# Patient Record
Sex: Male | Born: 1940 | Race: White | Hispanic: No | Marital: Married | Smoking: Never smoker
Health system: Southern US, Community
[De-identification: ages and names within clinical notes are randomized; demographics above are authoritative.]

## PROBLEM LIST (undated history)

## (undated) DIAGNOSIS — I495 Sick sinus syndrome: Secondary | ICD-10-CM

## (undated) DIAGNOSIS — I10 Essential (primary) hypertension: Secondary | ICD-10-CM

## (undated) DIAGNOSIS — I2510 Atherosclerotic heart disease of native coronary artery without angina pectoris: Secondary | ICD-10-CM

## (undated) DIAGNOSIS — I214 Non-ST elevation (NSTEMI) myocardial infarction: Secondary | ICD-10-CM

## (undated) DIAGNOSIS — I480 Paroxysmal atrial fibrillation: Secondary | ICD-10-CM

## (undated) DIAGNOSIS — R0600 Dyspnea, unspecified: Secondary | ICD-10-CM

## (undated) HISTORY — DX: Non-ST elevation (NSTEMI) myocardial infarction: I21.4

## (undated) HISTORY — DX: Atherosclerotic heart disease of native coronary artery without angina pectoris: I25.10

## (undated) HISTORY — DX: Paroxysmal atrial fibrillation: I48.0

## (undated) HISTORY — DX: Essential (primary) hypertension: I10

## (undated) HISTORY — PX: COLONOSCOPY: SHX174

## (undated) HISTORY — DX: Sick sinus syndrome: I49.5

---

## 2017-01-04 DIAGNOSIS — I214 Non-ST elevation (NSTEMI) myocardial infarction: Secondary | ICD-10-CM

## 2017-01-04 HISTORY — DX: Non-ST elevation (NSTEMI) myocardial infarction: I21.4

## 2017-01-31 ENCOUNTER — Inpatient Hospital Stay
Admission: AD | Admit: 2017-01-31 | Payer: Self-pay | Source: Other Acute Inpatient Hospital | Admitting: Internal Medicine

## 2017-01-31 ENCOUNTER — Telehealth: Payer: Self-pay | Admitting: Internal Medicine

## 2017-02-01 ENCOUNTER — Inpatient Hospital Stay (HOSPITAL_COMMUNITY)
Admission: AD | Disposition: A | Discharge status: Home / Self Care | Payer: Self-pay | Source: Other Acute Inpatient Hospital | Attending: Internal Medicine

## 2017-02-01 ENCOUNTER — Encounter (HOSPITAL_COMMUNITY): Payer: Self-pay | Admitting: *Deleted

## 2017-02-01 ENCOUNTER — Inpatient Hospital Stay (HOSPITAL_COMMUNITY)
Admission: AD | Admit: 2017-02-01 | Discharge: 2017-02-02 | DRG: 247 | Disposition: A | Discharge status: Home / Self Care | Payer: Medicare PPO | Source: Other Acute Inpatient Hospital | Attending: Internal Medicine | Admitting: Internal Medicine

## 2017-02-01 ENCOUNTER — Inpatient Hospital Stay (HOSPITAL_COMMUNITY): Payer: Medicare PPO

## 2017-02-01 DIAGNOSIS — I200 Unstable angina: Secondary | ICD-10-CM

## 2017-02-01 DIAGNOSIS — I25110 Atherosclerotic heart disease of native coronary artery with unstable angina pectoris: Secondary | ICD-10-CM | POA: Diagnosis present

## 2017-02-01 DIAGNOSIS — Z8679 Personal history of other diseases of the circulatory system: Secondary | ICD-10-CM

## 2017-02-01 DIAGNOSIS — R079 Chest pain, unspecified: Secondary | ICD-10-CM | POA: Diagnosis present

## 2017-02-01 DIAGNOSIS — Z23 Encounter for immunization: Secondary | ICD-10-CM

## 2017-02-01 DIAGNOSIS — I160 Hypertensive urgency: Secondary | ICD-10-CM

## 2017-02-01 DIAGNOSIS — I10 Essential (primary) hypertension: Secondary | ICD-10-CM

## 2017-02-01 DIAGNOSIS — I214 Non-ST elevation (NSTEMI) myocardial infarction: Secondary | ICD-10-CM | POA: Diagnosis present

## 2017-02-01 DIAGNOSIS — Z880 Allergy status to penicillin: Secondary | ICD-10-CM | POA: Diagnosis not present

## 2017-02-01 DIAGNOSIS — Z955 Presence of coronary angioplasty implant and graft: Secondary | ICD-10-CM

## 2017-02-01 HISTORY — PX: CORONARY STENT INTERVENTION: CATH118234

## 2017-02-01 HISTORY — PX: LEFT HEART CATH AND CORONARY ANGIOGRAPHY: CATH118249

## 2017-02-01 LAB — BASIC METABOLIC PANEL
Anion gap: 8 (ref 5–15)
BUN: 13 mg/dL (ref 6–20)
CALCIUM: 8.5 mg/dL — AB (ref 8.9–10.3)
CO2: 27 mmol/L (ref 22–32)
CREATININE: 1.19 mg/dL (ref 0.61–1.24)
Chloride: 103 mmol/L (ref 101–111)
GFR calc Af Amer: >60 mL/min (ref >60)
GFR calc non Af Amer: 58 mL/min — ABNORMAL LOW (ref >60)
GLUCOSE: 103 mg/dL — AB (ref 65–99)
Potassium: 3.5 mmol/L (ref 3.5–5.1)
SODIUM: 138 mmol/L (ref 135–145)

## 2017-02-01 LAB — CBC
HEMATOCRIT: 38.6 % — AB (ref 39.0–52.0)
Hemoglobin: 13.1 g/dL (ref 13.0–17.0)
MCH: 30.3 pg (ref 26.0–34.0)
MCHC: 33.9 g/dL (ref 30.0–36.0)
MCV: 89.4 fL (ref 78.0–100.0)
PLATELETS: 199 10*3/uL (ref 150–400)
RBC: 4.32 MIL/uL (ref 4.22–5.81)
RDW: 12.8 % (ref 11.5–15.5)
WBC: 10.1 10*3/uL (ref 4.0–10.5)

## 2017-02-01 LAB — LIPID PANEL
CHOL/HDL RATIO: 4.5 ratio
Cholesterol: 193 mg/dL (ref 0–200)
HDL: 43 mg/dL (ref >40)
LDL Cholesterol: 127 mg/dL — ABNORMAL HIGH (ref 0–99)
Triglycerides: 115 mg/dL (ref <150)
VLDL: 23 mg/dL (ref 0–40)

## 2017-02-01 LAB — HEPARIN LEVEL (UNFRACTIONATED): Heparin Unfractionated: 0.51 IU/mL (ref 0.30–0.70)

## 2017-02-01 LAB — PROTIME-INR
INR: 1.07
PROTHROMBIN TIME: 13.8 s (ref 11.4–15.2)

## 2017-02-01 LAB — HEMOGLOBIN A1C
Hgb A1c MFr Bld: 5.3 % (ref 4.8–5.6)
MEAN PLASMA GLUCOSE: 105.41 mg/dL

## 2017-02-01 LAB — TROPONIN I
TROPONIN I: 0.07 ng/mL — AB (ref <0.03)
TROPONIN I: 0.05 ng/mL — AB (ref <0.03)
Troponin I: 0.03 ng/mL (ref <0.03)

## 2017-02-01 LAB — POCT ACTIVATED CLOTTING TIME: ACTIVATED CLOTTING TIME: 439 s

## 2017-02-01 LAB — CREATININE, SERUM
Creatinine, Ser: 1.16 mg/dL (ref 0.61–1.24)
GFR calc Af Amer: >60 mL/min (ref >60)

## 2017-02-01 SURGERY — LEFT HEART CATH AND CORONARY ANGIOGRAPHY
Anesthesia: LOCAL

## 2017-02-01 MED ORDER — MORPHINE SULFATE (PF) 4 MG/ML IV SOLN: 2.0000 mg | INTRAVENOUS | Status: DC | PRN

## 2017-02-01 MED ORDER — SODIUM CHLORIDE 0.9 % IV SOLN
INTRAVENOUS | Status: AC
  Administered 2017-02-01: 1000 mL via INTRAVENOUS

## 2017-02-01 MED ORDER — NITROGLYCERIN 1 MG/10 ML FOR IR/CATH LAB
INTRA_ARTERIAL | Status: DC | PRN
  Administered 2017-02-01: 200 ug via INTRACORONARY

## 2017-02-01 MED ORDER — ATORVASTATIN CALCIUM 80 MG PO TABS
80.0000 mg | ORAL_TABLET | Freq: Every day | ORAL | Status: DC
  Administered 2017-02-01: 19:00:00 80 mg via ORAL
  Filled 2017-02-01: qty 1

## 2017-02-01 MED ORDER — VERAPAMIL HCL 2.5 MG/ML IV SOLN
INTRA_ARTERIAL | Status: DC | PRN
  Administered 2017-02-01 (×2): 5 mL via INTRA_ARTERIAL

## 2017-02-01 MED ORDER — HEPARIN (PORCINE) IN NACL 100-0.45 UNIT/ML-% IJ SOLN
1200.0000 [IU]/h | INTRAMUSCULAR | Status: DC
  Administered 2017-02-01: 1200 [IU]/h via INTRAVENOUS
  Filled 2017-02-01: qty 250

## 2017-02-01 MED ORDER — IOPAMIDOL (ISOVUE-370) INJECTION 76%
INTRAVENOUS | Status: AC
  Filled 2017-02-01: qty 100

## 2017-02-01 MED ORDER — MORPHINE SULFATE (PF) 2 MG/ML IV SOLN: 1.0000 mg | INTRAVENOUS | Status: DC | PRN

## 2017-02-01 MED ORDER — MORPHINE SULFATE (PF) 10 MG/ML IV SOLN: 2.0000 mg | INTRAVENOUS | Status: DC | PRN

## 2017-02-01 MED ORDER — SODIUM CHLORIDE 0.9% FLUSH
3.0000 mL | Freq: Two times a day (BID) | INTRAVENOUS | Status: DC
Start: 2017-02-01 — End: 2017-02-01
  Administered 2017-02-01: 3 mL via INTRAVENOUS

## 2017-02-01 MED ORDER — ACETAMINOPHEN 325 MG PO TABS
650.0000 mg | ORAL_TABLET | ORAL | Status: DC | PRN
  Administered 2017-02-02: 650 mg via ORAL
  Filled 2017-02-01: qty 2

## 2017-02-01 MED ORDER — NITROGLYCERIN 0.4 MG SL SUBL: 0.4000 mg | SUBLINGUAL_TABLET | SUBLINGUAL | Status: DC | PRN

## 2017-02-01 MED ORDER — ATORVASTATIN CALCIUM 40 MG PO TABS: 40.0000 mg | ORAL_TABLET | Freq: Every day | ORAL | Status: DC

## 2017-02-01 MED ORDER — TICAGRELOR 90 MG PO TABS
90.0000 mg | ORAL_TABLET | Freq: Two times a day (BID) | ORAL | Status: DC
  Administered 2017-02-02: 05:00:00 90 mg via ORAL
  Filled 2017-02-01 (×2): qty 1

## 2017-02-01 MED ORDER — ANGIOPLASTY BOOK
Freq: Once | Status: DC
  Filled 2017-02-01: qty 1

## 2017-02-01 MED ORDER — HYDRALAZINE HCL 20 MG/ML IJ SOLN
INTRAMUSCULAR | Status: DC | PRN
  Administered 2017-02-01: 10 mg via INTRAVENOUS

## 2017-02-01 MED ORDER — VERAPAMIL HCL 2.5 MG/ML IV SOLN
INTRAVENOUS | Status: AC
  Filled 2017-02-01: qty 2

## 2017-02-01 MED ORDER — BIVALIRUDIN TRIFLUOROACETATE 250 MG IV SOLR
INTRAVENOUS | Status: AC
  Filled 2017-02-01: qty 250

## 2017-02-01 MED ORDER — SODIUM CHLORIDE 0.9 % IV SOLN: 250.0000 mL | INTRAVENOUS | Status: DC | PRN

## 2017-02-01 MED ORDER — BIVALIRUDIN BOLUS VIA INFUSION - CUPID
INTRAVENOUS | Status: DC | PRN
  Administered 2017-02-01: 65.925 mg via INTRAVENOUS

## 2017-02-01 MED ORDER — SODIUM CHLORIDE 0.9 % IV SOLN
INTRAVENOUS | Status: DC | PRN
  Administered 2017-02-01: 1.75 mg/kg/h via INTRAVENOUS

## 2017-02-01 MED ORDER — TICAGRELOR 90 MG PO TABS
ORAL_TABLET | ORAL | Status: AC
  Filled 2017-02-01: qty 2

## 2017-02-01 MED ORDER — HEPARIN SODIUM (PORCINE) 1000 UNIT/ML IJ SOLN
INTRAMUSCULAR | Status: DC | PRN
  Administered 2017-02-01: 4500 [IU] via INTRAVENOUS

## 2017-02-01 MED ORDER — SODIUM CHLORIDE 0.9 % WEIGHT BASED INFUSION
1.0000 mL/kg/h | INTRAVENOUS | Status: DC
  Administered 2017-02-01: 1 mL/kg/h via INTRAVENOUS

## 2017-02-01 MED ORDER — TICAGRELOR 90 MG PO TABS
ORAL_TABLET | ORAL | Status: DC | PRN
  Administered 2017-02-01: 180 mg via ORAL

## 2017-02-01 MED ORDER — ACETAMINOPHEN 325 MG PO TABS: 650.0000 mg | ORAL_TABLET | ORAL | Status: DC | PRN

## 2017-02-01 MED ORDER — ONDANSETRON HCL 4 MG/2ML IJ SOLN
4.0000 mg | Freq: Four times a day (QID) | INTRAMUSCULAR | Status: DC | PRN
  Administered 2017-02-02: 01:00:00 4 mg via INTRAVENOUS
  Filled 2017-02-01: qty 2

## 2017-02-01 MED ORDER — HYDRALAZINE HCL 20 MG/ML IJ SOLN
10.0000 mg | INTRAMUSCULAR | Status: DC | PRN
  Administered 2017-02-01 – 2017-02-02 (×2): 10 mg via INTRAVENOUS
  Filled 2017-02-01 (×2): qty 1

## 2017-02-01 MED ORDER — HYDRALAZINE HCL 20 MG/ML IJ SOLN
INTRAMUSCULAR | Status: AC
  Filled 2017-02-01: qty 1

## 2017-02-01 MED ORDER — ASPIRIN 81 MG PO CHEW
81.0000 mg | CHEWABLE_TABLET | ORAL | Status: AC
  Administered 2017-02-01: 81 mg via ORAL
  Filled 2017-02-01: qty 1

## 2017-02-01 MED ORDER — SODIUM CHLORIDE 0.9 % WEIGHT BASED INFUSION: 3.0000 mL/kg/h | INTRAVENOUS | Status: DC

## 2017-02-01 MED ORDER — HEPARIN SODIUM (PORCINE) 1000 UNIT/ML IJ SOLN
INTRAMUSCULAR | Status: AC
  Filled 2017-02-01: qty 1

## 2017-02-01 MED ORDER — ASPIRIN 81 MG PO CHEW
81.0000 mg | CHEWABLE_TABLET | Freq: Every day | ORAL | Status: DC
  Administered 2017-02-02: 11:00:00 81 mg via ORAL
  Filled 2017-02-01: qty 1

## 2017-02-01 MED ORDER — ALPRAZOLAM 0.25 MG PO TABS: 0.2500 mg | ORAL_TABLET | Freq: Two times a day (BID) | ORAL | Status: DC | PRN

## 2017-02-01 MED ORDER — ENOXAPARIN SODIUM 100 MG/ML ~~LOC~~ SOLN: 1.0000 mg/kg | Freq: Two times a day (BID) | SUBCUTANEOUS | Status: DC

## 2017-02-01 MED ORDER — LIDOCAINE HCL 2 % IJ SOLN
INTRAMUSCULAR | Status: AC
  Filled 2017-02-01: qty 20

## 2017-02-01 MED ORDER — ONDANSETRON HCL 4 MG/2ML IJ SOLN: 4.0000 mg | Freq: Four times a day (QID) | INTRAMUSCULAR | Status: DC | PRN

## 2017-02-01 MED ORDER — SODIUM CHLORIDE 0.9% FLUSH: 3.0000 mL | INTRAVENOUS | Status: DC | PRN

## 2017-02-01 MED ORDER — SODIUM CHLORIDE 0.9 % IV SOLN
1.7500 mg/kg/h | INTRAVENOUS | Status: AC
  Administered 2017-02-01: 1.75 mg/kg/h via INTRAVENOUS
  Filled 2017-02-01: qty 250

## 2017-02-01 MED ORDER — ASPIRIN EC 81 MG PO TBEC: 81.0000 mg | DELAYED_RELEASE_TABLET | Freq: Every day | ORAL | Status: DC

## 2017-02-01 MED ORDER — LABETALOL HCL 5 MG/ML IV SOLN: 10.0000 mg | INTRAVENOUS | Status: AC | PRN

## 2017-02-01 MED ORDER — HYDRALAZINE HCL 20 MG/ML IJ SOLN: 5.0000 mg | INTRAMUSCULAR | Status: AC | PRN

## 2017-02-01 MED ORDER — SODIUM CHLORIDE 0.9% FLUSH
3.0000 mL | Freq: Two times a day (BID) | INTRAVENOUS | Status: DC
  Administered 2017-02-01: 3 mL via INTRAVENOUS

## 2017-02-01 MED ORDER — INFLUENZA VAC SPLIT HIGH-DOSE 0.5 ML IM SUSY
0.5000 mL | PREFILLED_SYRINGE | INTRAMUSCULAR | Status: AC
  Administered 2017-02-02: 0.5 mL via INTRAMUSCULAR
  Filled 2017-02-01 (×2): qty 0.5

## 2017-02-01 MED ORDER — HEPARIN (PORCINE) IN NACL 2-0.9 UNIT/ML-% IJ SOLN
INTRAMUSCULAR | Status: DC | PRN
  Administered 2017-02-01: 1000 mL

## 2017-02-01 MED ORDER — HEPARIN (PORCINE) IN NACL 2-0.9 UNIT/ML-% IJ SOLN
INTRAMUSCULAR | Status: AC
  Filled 2017-02-01: qty 1000

## 2017-02-01 MED ORDER — NITROGLYCERIN 1 MG/10 ML FOR IR/CATH LAB
INTRA_ARTERIAL | Status: AC
  Filled 2017-02-01: qty 10

## 2017-02-01 SURGICAL SUPPLY — 20 items
BALLN SAPPHIRE 2.0X12 (BALLOONS) ×2
BALLN ~~LOC~~ EMERGE MR 3.25X15 (BALLOONS) ×2
BALLOON SAPPHIRE 2.0X12 (BALLOONS) ×1 IMPLANT
BALLOON ~~LOC~~ EMERGE MR 3.25X15 (BALLOONS) ×1 IMPLANT
CATH INFINITI 5FR ANG PIGTAIL (CATHETERS) ×2 IMPLANT
CATH OPTITORQUE TIG 4.0 5F (CATHETERS) ×2 IMPLANT
CATH VISTA GUIDE 6FR XBLAD3.5 (CATHETERS) ×2 IMPLANT
DEVICE RAD COMP TR BAND LRG (VASCULAR PRODUCTS) ×2 IMPLANT
GLIDESHEATH SLEND A-KIT 6F 22G (SHEATH) ×2 IMPLANT
GUIDEWIRE INQWIRE 1.5J.035X260 (WIRE) ×1 IMPLANT
INQWIRE 1.5J .035X260CM (WIRE) ×2
KIT ENCORE 26 ADVANTAGE (KITS) ×2 IMPLANT
KIT HEART LEFT (KITS) ×2 IMPLANT
PACK CARDIAC CATHETERIZATION (CUSTOM PROCEDURE TRAY) ×2 IMPLANT
STENT SYNERGY DES 3X20 (Permanent Stent) ×2 IMPLANT
SYR MEDRAD MARK V 150ML (SYRINGE) ×2 IMPLANT
TRANSDUCER W/STOPCOCK (MISCELLANEOUS) ×2 IMPLANT
TUBING CIL FLEX 10 FLL-RA (TUBING) ×2 IMPLANT
WIRE ASAHI PROWATER 180CM (WIRE) ×2 IMPLANT
WIRE HI TORQ VERSACORE-J 145CM (WIRE) ×2 IMPLANT

## 2017-02-01 NOTE — Progress Notes (Signed)
Patient admitted after midnight, please see H&P.  For cath later today, NAD Marlin CanaryJessica Hedaya Latendresse DO

## 2017-02-01 NOTE — Progress Notes (Signed)
Progress Note  Patient Name: Calvin GalasMichael Cochran Date of Encounter: 02/01/2017  Primary Cardiologist: New  Subjective   No chest pain. No shortness of breath.   Inpatient Medications    Scheduled Meds: . [START ON 02/02/2017] aspirin EC  81 mg Oral Daily  . atorvastatin  80 mg Oral q1800  . [START ON 02/02/2017] Influenza vac split quadrivalent PF  0.5 mL Intramuscular Tomorrow-1000   Continuous Infusions: . heparin 1,200 Units/hr (02/01/17 0652)   PRN Meds: acetaminophen, ALPRAZolam, hydrALAZINE, morphine injection, nitroGLYCERIN, ondansetron (ZOFRAN) IV   Vital Signs    Vitals:   02/01/17 0043 02/01/17 0616  BP: 131/71 127/68  Pulse: (!) 52 (!) 57  Resp: 16 16  Temp: 98.1 F (36.7 C) 98 F (36.7 C)  TempSrc: Oral Oral  SpO2: 100% 98%  Weight: 196 lb 14.4 oz (89.3 kg) 193 lb 11.2 oz (87.9 kg)  Height: 5\' 9"  (1.753 m)    No intake or output data in the 24 hours ending 02/01/17 0807 Filed Weights   02/01/17 0043 02/01/17 0616  Weight: 196 lb 14.4 oz (89.3 kg) 193 lb 11.2 oz (87.9 kg)    Telemetry    SR - Personally Reviewed  ECG    SR with TWI in anterolateral leads - Personally Reviewed  Physical Exam   General: Well developed, well nourished, male appearing in no acute distress. Head: Normocephalic, atraumatic.  Neck: Supple without bruits, JVD. Lungs:  Resp regular and unlabored, CTA. Heart: RRR, S1, S2, no S3, S4, or murmur; no rub. Abdomen: Soft, non-tender, non-distended with normoactive bowel sounds. No hepatomegaly. No rebound/guarding. No obvious abdominal masses. Extremities: No clubbing, cyanosis, edema. Distal pedal pulses are 2+ bilaterally. Neuro: Alert and oriented X 3. Moves all extremities spontaneously. Psych: Normal affect.  Labs    Chemistry Recent Labs Lab 02/01/17 0125  NA 138  K 3.5  CL 103  CO2 27  GLUCOSE 103*  BUN 13  CREATININE 1.16  1.19  CALCIUM 8.5*  GFRNONAA >60  58*  GFRAA >60  >60  ANIONGAP 8      Hematology Recent Labs Lab 02/01/17 0125  WBC 10.1  RBC 4.32  HGB 13.1  HCT 38.6*  MCV 89.4  MCH 30.3  MCHC 33.9  RDW 12.8  PLT 199    Cardiac Enzymes Recent Labs Lab 02/01/17 0125  TROPONINI 0.07*   No results for input(s): TROPIPOC in the last 168 hours.   BNPNo results for input(s): BNP, PROBNP in the last 168 hours.   DDimer No results for input(s): DDIMER in the last 168 hours.    Radiology    Portable Chest X-ray 1 View  Result Date: 02/01/2017 CLINICAL DATA:  Left-sided chest pain today associated with shortness of breath. History of NSTEMI, hypertension common never smoked. EXAM: PORTABLE CHEST 1 VIEW COMPARISON:  None in PACs FINDINGS: The left lung is well-expanded and clear. On the right there is elevation of the hemidiaphragm. The aerated portion of the right lung is clear. The heart and pulmonary vascularity are normal. The mediastinum is normal in width. The bony thorax is unremarkable. IMPRESSION: Elevated right hemidiaphragm. Otherwise no acute cardiopulmonary abnormality. Electronically Signed   By: David  SwazilandJordan M.D.   On: 02/01/2017 07:24    Cardiac Studies   N/a  Patient Profile     76 y.o. male with no PMH who presented with several days of chest tightness that worsened yesterday morning with associated shortness of breath.   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 Had another episode that night, then again Thursday. Was very fatigued over the weekend. Yesterday morning developed 9/10 chest pain with nausea and shortness of breath. Symptoms very concerning for ACS. Presented to [facility] and transferred here for cath. Trop 0.07. Remains on IV heparin. -- will plan for cardiac cath today. The patient understands that risks included but are not limited to stroke (1 in 1000), death (1 in 1000), kidney failure [usually temporary] (1 in  500), bleeding (1 in 200), allergic reaction [possibly serious] (1 in 200).  -- started on statin, no room for BB as he is bradycardic at rest.     Signed, Laverda Page, NP  02/01/2017, 8:07 AM  Pager # [phone]   Patient seen, examined. Available data reviewed. Agree with findings, assessment, and plan as outlined by Laverda Page, NP-C.  The patient is chest pain-free.  He remains on IV heparin.  On exam he is alert and oriented in no distress, lungs clear, JVP normal, heart is regular rate and rhythm with no murmur or gallop, abdomen is soft and nontender, there is no pretibial edema.  I agree with the documentation above.  The patient has typical symptoms of acute coronary syndrome, a borderline positive troponin, and ST/T wave abnormality concerning for anterior ischemia.  Cardiac catheterization and possible PCI is clearly indicated.  The patient understands the risks and potential benefits which have been clearly discussed and documented in previous notes.  All of his questions were answered today.  Further disposition pending his cardiac catheterization result.  Tonny Bollman, M.D. 02/01/2017 4:51 PM  For questions or updates, please contact [facility] Please consult [email or web address] for contact info under Cardiology/STEMI. Daytime calls, contact the Day Call APP (6a-8a) or assigned team (Teams A-D) provider (7:30a - 5p). All other daytime calls (7:30-5p), contact the Card Master @ [phone].   Nighttime calls, contact the assigned APP (5p-8p) or MD (6:30p-8p). Overnight calls (8p-6a), contact the on call Fellow @ [phone].

## 2017-02-01 NOTE — Interval H&P Note (Signed)
Cath Lab Visit (complete for each Cath Lab visit)  Clinical Evaluation Leading to the Procedure:   ACS: Yes.    Non-ACS:    Anginal Classification: CCS III  Anti-ischemic medical therapy: No Therapy  Non-Invasive Test Results: No non-invasive testing performed  Prior CABG: No previous CABG      History and Physical Interval Note:  02/01/2017 4:09 PM  Calvin Cochran  has presented today for surgery, with the diagnosis of cp  The various methods of treatment have been discussed with the patient and family. After consideration of risks, benefits and other options for treatment, the patient has consented to  Procedure(s): LEFT HEART CATH AND CORONARY ANGIOGRAPHY (N/A) as a surgical intervention .  [name] history has been reviewed, patient examined, no change in status, stable for surgery.  I have reviewed the patient's chart and labs.  Questions were answered to the patient's satisfaction.     Nanetta BattyBerry, Jonathan

## 2017-02-01 NOTE — H&P (Signed)
History and Physical    Demarrio Menges ZOX:[id number] DOB: 1941/01/01 DOA: 02/01/2017  PCP: Patient, No Pcp Per; Confirmed with patient  Patient coming from: Home, by way of [facility]   Chief Complaint: Chest pain   HPI: Yaroslav Gombos is a 76 y.o. male with medical history significant for hypertension, not on any medications, and see department for evaluation of intermittent chest pain for the past 5 days.  Patient reports that beginning 5 days ago, he has been experiencing intermittent left-sided chest pain with radiation towards the right side, described as a pressure sensation, "8/10" in severity, and associated with dyspnea, fatigue, diaphoresis, and nausea.  This has occurred with exertion and without.  After the first episode 5 days ago, the patient used a push mower on his lawn and did not experience any of the symptoms.  Since that time, he has had multiple recurrences which seem to be worsening in severity.  Episode that prompted his ED visit tonight was the most severe.  [facility] ED Course: Upon arrival to the ED, patient is found to be afebrile, saturating adequately on 2 L/min of supplemental oxygen, hypertensive to 170/100, and was stable.  EKG features a sinus rhythm with ST depressions in the inferolateral leads.  Chest x-ray was notable for an elevated right hemidiaphragm and no acute cardiopulmonary disease.  Chemistry panel was unremarkable, so was CBC.  Troponin was undetectable and UDS was negative.  Patient was treated with 324 mg of aspirin, 1 inch of nitroglycerin ointment, and 80 mg of Lovenox.  Resolution of his presenting complaints was reported after the nitroglycerin was applied.  Repeat EKG also demonstrates inferolateral ST depressions, but less pronounced than on the initial study.  Cardiologist here at [facility] was consulted by the ED physician and agreed to see the patient in consultation start troponin-question Ms is here unit stepdown telemetry thanks.   Admission to the hospitalist service was arranged at [facility] and the patient arrives afebrile with slight sinus bradycardia and no apparent distress.  He will undergo further evaluation and management of chest pain concerning for unstable angina.  Review of Systems:  All other systems reviewed and apart from HPI, are negative.  Past Medical History:  Diagnosis Date  . Hypertension     History reviewed. No pertinent surgical history.   reports that he has never smoked. He has never used smokeless tobacco. He reports that he drinks alcohol. He reports that he does not use drugs.  Allergies  Allergen Reactions  . Amoxicillin Itching    Family History  Problem Relation Age of Onset  . Lung cancer Father      Prior to Admission medications   Not on File    Physical Exam: Vitals:   02/01/17 0043  BP: 131/71  Pulse: (!) 52  Resp: 16  Temp: 98.1 F (36.7 C)  TempSrc: Oral  SpO2: 100%  Weight: 89.3 kg (196 lb 14.4 oz)  Height: 5\' 9"  (1.753 m)      Constitutional: NAD, calm, comfortable Eyes: PERTLA, lids and conjunctivae normal ENMT: Mucous membranes are moist. Posterior pharynx clear of any exudate or lesions.   Neck: normal, supple, no masses, no thyromegaly Respiratory: clear to auscultation bilaterally, no wheezing, no crackles. Normal respiratory effort.    Cardiovascular: S1 & S2 heard, regular rate and rhythm. No extremity edema. No significant JVD. Abdomen: No distension, no tenderness, no masses palpated. Bowel sounds normal.  Musculoskeletal: no clubbing / cyanosis. No joint deformity upper and lower  extremities.   Skin: no significant rashes, lesions, ulcers. Warm, dry, well-perfused. Neurologic: CN 2-12 grossly intact. Sensation intact. Strength 5/5 in all 4 limbs.  Psychiatric: Alert and oriented x 3. Pleasant and cooperative.     Labs on Admission: I have personally reviewed following labs and imaging studies  CBC: No results for input(s): WBC,  NEUTROABS, HGB, HCT, MCV, PLT in the last 168 hours. Basic Metabolic Panel: No results for input(s): NA, K, CL, CO2, GLUCOSE, BUN, CREATININE, CALCIUM, MG, PHOS in the last 168 hours. GFR: CrCl cannot be calculated (No order found.). Liver Function Tests: No results for input(s): AST, ALT, ALKPHOS, BILITOT, PROT, ALBUMIN in the last 168 hours. No results for input(s): LIPASE, AMYLASE in the last 168 hours. No results for input(s): AMMONIA in the last 168 hours. Coagulation Profile: No results for input(s): INR, PROTIME in the last 168 hours. Cardiac Enzymes: No results for input(s): CKTOTAL, CKMB, CKMBINDEX, TROPONINI in the last 168 hours. BNP (last 3 results) No results for input(s): PROBNP in the last 8760 hours. HbA1C: No results for input(s): HGBA1C in the last 72 hours. CBG: No results for input(s): GLUCAP in the last 168 hours. Lipid Profile: No results for input(s): CHOL, HDL, LDLCALC, TRIG, CHOLHDL, LDLDIRECT in the last 72 hours. Thyroid Function Tests: No results for input(s): TSH, T4TOTAL, FREET4, T3FREE, THYROIDAB in the last 72 hours. Anemia Panel: No results for input(s): VITAMINB12, FOLATE, FERRITIN, TIBC, IRON, RETICCTPCT in the last 72 hours. Urine analysis: No results found for: COLORURINE, APPEARANCEUR, LABSPEC, PHURINE, GLUCOSEU, HGBUR, BILIRUBINUR, KETONESUR, PROTEINUR, UROBILINOGEN, NITRITE, LEUKOCYTESUR Sepsis Labs: @LABRCNTIP (procalcitonin:4,lacticidven:4) )No results found for this or any previous visit (from the past 240 hour(s)).   Radiological Exams on Admission: No results found.  EKG: Independently reviewed. Sinus rhythm, diffuse repolarization abnormality. Repeat EKG after nitroglycerin (and resolution of pain) with improved ST abnormalities.  Assessment/Plan  1. Chest pain  - Pt presents with intermittent chest pain for past 5 days - In the ED, he had unremarkable CXR, undetectable troponin, and EKG with diffuse ST-depressions  - He was  treated with ASA 324, 1" nitroglycerin ointment, and treatment-dose Lovenox  - Pain resolved with NTG and has not recurred; repeat EKG is improved  - Cardiology is consulting and much appreciated  - Continue cardiac monitoring, obtain serial troponin measurements, start Lipitor, beta-blocker precluded by sinus bradycardia, follow-up cardiology recommendations    2. Hypertension with hypertensive urgency  - BP elevated to 170/100 in ED, improved with NTG  - Pt reports hx of HTN, but does not have PCP or take any medications  - Beta-blocker precluded by sinus bradycardia, will use hydralazine IVP's prn     DVT prophylaxis: Treatment-dose Lovenox  Code Status: Full   Family Communication: Discussed with patient  Disposition Plan: Admit to stepdown  Consults called: Cardiology Admission status: Inpatient    Briscoe Deutscherimothy S Opyd, MD [facility] Pager [phone]  If 7PM-7AM, please contact night-coverage [email or web address] Password [email or web address]  02/01/2017, 1:30 AM

## 2017-02-01 NOTE — Progress Notes (Signed)
ANTICOAGULATION CONSULT NOTE - Initial Consult  Pharmacy Consult for Lovenox>>Heparin Indication: chest pain/ACS  Allergies  Allergen Reactions  . Amoxicillin Itching    Patient Measurements: Height: 5\' 9"  (175.3 cm) Weight: 193 lb 11.2 oz (87.9 kg) IBW/kg (Calculated) : 70.7  Vital Signs: Temp: 98.1 F (36.7 C) (10/29 1300) Temp Source: Oral (10/29 1300) BP: 158/88 (10/29 1300) Pulse Rate: 57 (10/29 0616)  Labs:  Recent Labs  02/01/17 0125 02/01/17 0758 02/01/17 1224 02/01/17 1426  HGB 13.1  --   --   --   HCT 38.6*  --   --   --   PLT 199  --   --   --   LABPROT  --   --  13.8  --   INR  --   --  1.07  --   HEPARINUNFRC  --   --   --  0.51  CREATININE 1.16  1.19  --   --   --   TROPONINI 0.07* 0.05* 0.03*  --     Estimated Creatinine Clearance: 61.3 mL/min (by C-G formula based on SCr of 1.16 mg/dL).   Medical History: Past Medical History:  Diagnosis Date  . Hypertension      Assessment: 76 y/o M transfer from outside hospital changing from Lovenox>>Heparin. CBC stable. Heparin level is therapeutic at 0.51 on rate of 1200 units/hr. No interruptions to heparin infusion. No signs/symptoms of bleeding.   Goal of Therapy:  Heparin level 0.3-0.7 units/ml Monitor platelets by anticoagulation protocol: Yes   Plan:  -Continue heparin at 1200 units/hr -Monitor daily HL, CBC, and s/sx of bleeding -Follow up post-cath for plan with anticoagulation    Girard CooterKimberly Perkins, PharmD Clinical Pharmacist  Pager: [phone] Clinical Phone for 02/01/2017 until 3:30pm: [phone] If after 3:30pm, please call main pharmacy at [phone]

## 2017-02-01 NOTE — H&P (View-Only) (Signed)
CARDIOLOGY CONSULT NOTE   Referring Physician: Dr. Antionette Char Primary Physician: NA Primary Cardiologist: NA Reason for Consultation: NSTEMI   HPI: [name] is a 76 yo man without significant PMH who is seen in consult at the request of Dr. Antionette Char for evaluation and management of NSTEMI. Patient reports that on 10/24 he had an episode of stabbing left chest pain, associated with "heaviness like something is sitting on my chest," nausea, shortness of breath, and diaphoresis. 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 Resolved spontaneously after 15-20 min. Later in the day he was able to mow the lawn without symptoms. He had a repeat episode on 10/25, nonexertional, and again on 10/26. On 10/27 he felt fatigued all day but no specific chest discomfort. He had another episode the AM of 10/28, and then later on 10/28 he described a severe, 9/10 episode of the pain that occurred after a shower. At that time he presented to the [facility] ED for evaluation. On arrival there he was in severe pain. Per report, he was hypertensive to 170/100, initial troponin was negative. He reportedly received 324 mg aspirin, 1 inch of nitropaste, and 80 mg lovenox. His symptoms resolved with nitro. He was transferred to [facility] for further evaluation.  Of note, patient denies any family history of heart disease--mom lived to be 13 and dad died of cancer at 77. Nonsmoker. Does long distance bicycle rides as a hobby. On no medications at home. No diabetes.   Review of Systems:     Cardiac Review of Systems: {Y] = yes [ ]  = no  Chest Pain [Y    ]  Resting SOB Klaus.Mock   ] Exertional SOB  [ N ]  Orthopnea [ N ]   Pedal Edema [  N ]    Palpitations [ N ] Syncope  [  N]   Presyncope [ N  ]  General Review of Systems: [Y] = yes [  ]=no Constitional: recent weight change [  ]; anorexia [  ]; fatigue [  Y]; nausea [  ]; night sweats [  ]; fever [  ]; or chills [  ];                                                                      Eyes : blurred vision [  ]; diplopia [   ]; vision changes [  ];  Amaurosis fugax[  ]; Resp: cough [  ];  wheezing[  ];  hemoptysis[  ];  PND [  ];  GI:  gallstones[  ], vomiting[  ];  dysphagia[  ]; melena[  ];  hematochezia [  ]; heartburn[  ];   GU: kidney stones [  ]; hematuria[  ];   dysuria [  ];  nocturia[  ]; incontinence [  ];             Skin: rash, swelling[  ];, hair loss[  ];  peripheral edema[  ];  or itching[  ]; Musculosketetal: myalgias[  ];  joint swelling[  ];  joint erythema[  ];  joint pain[  ];  back pain[  ];  Heme/Lymph: bruising[  ];  bleeding[  ];  anemia[  ];  Neuro: TIA[  ];  headaches[  ];  stroke[  ];  vertigo[  ];  seizures[  ];   paresthesias[  ];  difficulty walking[  ];  Psych:depression[  ]; anxiety[  ];  Endocrine: diabetes[  ];  thyroid dysfunction[  ];  Other:  Past Medical History:  Diagnosis Date  . Hypertension     No prescriptions prior to admission.     Calvin Cochran. [START ON 02/02/2017] aspirin EC  81 mg Oral Daily  . atorvastatin  80 mg Oral q1800  . [START ON 02/02/2017] Influenza vac split quadrivalent PF  0.5 mL Intramuscular Tomorrow-1000    Infusions: . heparin      Allergies  Allergen Reactions  . Amoxicillin Itching    Social History   Social History  . Marital status: Married    Spouse name: N/A  . Number of children: N/A  . Years of education: N/A   Occupational History  . Not on file.   Social History Main Topics  . Smoking status: Never Smoker  . Smokeless tobacco: Never Used  . Alcohol use Yes     Comment: Occasional  . Drug use: No  . Sexual activity: Not on file   Other Topics Concern  . Not on file   Social History Narrative  . No narrative on file    Family History  Problem Relation Age of Onset  . Lung cancer Father     PHYSICAL EXAM: Vitals:   02/01/17 0043  BP: 131/71  Pulse: (!) 52  Resp: 16  Temp: 98.1 F (36.7 C)  SpO2: 100%    No intake or output data  in the 24 hours ending 02/01/17 [id number]  General:  Well appearing. No respiratory difficulty HEENT: normal Neck: supple. no JVD. Carotids 2+ bilat; no bruits. No lymphadenopathy or thryomegaly appreciated. Cor: PMI nondisplaced. Regular rate & rhythm. No rubs, gallops or murmurs. Lungs: clear Abdomen: soft, nontender, nondistended. No hepatosplenomegaly. No bruits or masses. Good bowel sounds. Extremities: no cyanosis, clubbing, rash, edema Neuro: alert & oriented x 3, cranial nerves grossly intact. moves all 4 extremities w/o difficulty. Affect pleasant.  ECG: sinus bradycardia with precordial T wave inversions, no acute ST elevations  Results for orders placed or performed during the hospital encounter of 02/01/17 (from the past 24 hour(s))  CBC     Status: Abnormal   Collection Time: 02/01/17  1:25 AM  Result Value Ref Range   WBC 10.1 4.0 - 10.5 K/uL   RBC 4.32 4.22 - 5.81 MIL/uL   Hemoglobin 13.1 13.0 - 17.0 g/dL   HCT 40.138.6 (L) 02.739.0 - 25.352.0 %   MCV 89.4 78.0 - 100.0 fL   MCH 30.3 26.0 - 34.0 pg   MCHC 33.9 30.0 - 36.0 g/dL   RDW 66.412.8 40.311.5 - 47.415.5 %   Platelets 199 150 - 400 K/uL  Creatinine, serum     Status: None   Collection Time: 02/01/17  1:25 AM  Result Value Ref Range   Creatinine, Ser 1.16 0.61 - 1.24 mg/dL   GFR calc non Af Amer >60 >60 mL/min   GFR calc Af Amer >60 >60 mL/min  Troponin I     Status: Abnormal   Collection Time: 02/01/17  1:25 AM  Result Value Ref Range   Troponin I 0.07 (HH) <0.03 ng/mL  Basic metabolic panel     Status: Abnormal   Collection Time: 02/01/17  1:25 AM  Result Value Ref Range   Sodium  138 135 - 145 mmol/L   Potassium 3.5 3.5 - 5.1 mmol/L   Chloride 103 101 - 111 mmol/L   CO2 27 22 - 32 mmol/L   Glucose, Bld 103 (H) 65 - 99 mg/dL   BUN 13 6 - 20 mg/dL   Creatinine, Ser 5.40 0.61 - 1.24 mg/dL   Calcium 8.5 (L) 8.9 - 10.3 mg/dL   GFR calc non Af Amer 58 (L) >60 mL/min   GFR calc Af Amer >60 >60 mL/min   Anion gap 8 5 - 15   No  results found.   ASSESSMENT/RECOMMENDATIONS: Shane Crutch is concerning for unstable angina, and first troponin here is now positive, consistent with a non-ST elevation MI. Now chest pain free. Does not have significant known risk factors, nondiabetic per history.  -changed lovenox to heparin -increased atorvastatin to 80 mg nightly -continue aspirin 81 mg. P2Y12 pending possible cath -NPO for possible cath today -echo -BP now improved with cessation of his pain, monitor -no beta blocker for now as he is bradycardic at baseline -ordered A1c, lipid panel  We will see him today to evaluate for cath. Please call with questions.  Jodelle Red, MD, PhD, overnight cardiology provider

## 2017-02-01 NOTE — Progress Notes (Signed)
ANTICOAGULATION CONSULT NOTE - Initial Consult  Pharmacy Consult for Lovenox  Indication: chest pain/ACS  Allergies  Allergen Reactions  . Amoxicillin Itching    Patient Measurements: Height: 5\' 9"  (175.3 cm) Weight: 196 lb 14.4 oz (89.3 kg) IBW/kg (Calculated) : 70.7  Vital Signs: Temp: 98.1 F (36.7 C) (10/29 0043) Temp Source: Oral (10/29 0043) BP: 131/71 (10/29 0043) Pulse Rate: 52 (10/29 0043)  Labs:  Recent Labs  02/01/17 0125  HGB 13.1  HCT 38.6*  PLT 199  CREATININE 1.16  1.19  TROPONINI 0.07*    Estimated Creatinine Clearance: 61.7 mL/min (by C-G formula based on SCr of 1.16 mg/dL).   Medical History: Past Medical History:  Diagnosis Date  . Hypertension    Assessment: 76 y.o. male with chest pain for Lovenox   Plan:  Lovenox 90 mg SQ q12h as ordered by MD  Meshulem Onorato, Gary FleetGregory Vernon 02/01/2017,3:10 AM

## 2017-02-01 NOTE — Consult Note (Signed)
CARDIOLOGY CONSULT NOTE   Referring Physician: Dr. Antionette Char Primary Physician: NA Primary Cardiologist: NA Reason for Consultation: NSTEMI   HPI: [name] is a 76 yo man without significant PMH who is seen in consult at the request of Dr. Antionette Char for evaluation and management of NSTEMI. Patient reports that on 10/24 he had an episode of stabbing left chest pain, associated with "heaviness like something is sitting on my chest," nausea, shortness of breath, and diaphoresis. 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 Resolved spontaneously after 15-20 min. Later in the day he was able to mow the lawn without symptoms. He had a repeat episode on 10/25, nonexertional, and again on 10/26. On 10/27 he felt fatigued all day but no specific chest discomfort. He had another episode the AM of 10/28, and then later on 10/28 he described a severe, 9/10 episode of the pain that occurred after a shower. At that time he presented to the [facility] ED for evaluation. On arrival there he was in severe pain. Per report, he was hypertensive to 170/100, initial troponin was negative. He reportedly received 324 mg aspirin, 1 inch of nitropaste, and 80 mg lovenox. His symptoms resolved with nitro. He was transferred to [facility] for further evaluation.  Of note, patient denies any family history of heart disease--mom lived to be 13 and dad died of cancer at 77. Nonsmoker. Does long distance bicycle rides as a hobby. On no medications at home. No diabetes.   Review of Systems:     Cardiac Review of Systems: {Y] = yes [ ]  = no  Chest Pain [Y    ]  Resting SOB Calvin.Cochran   ] Exertional SOB  [ N ]  Orthopnea [ N ]   Pedal Edema [  N ]    Palpitations [ N ] Syncope  [  N]   Presyncope [ N  ]  General Review of Systems: [Y] = yes [  ]=no Constitional: recent weight change [  ]; anorexia [  ]; fatigue [  Y]; nausea [  ]; night sweats [  ]; fever [  ]; or chills [  ];                                                                      Eyes : blurred vision [  ]; diplopia [   ]; vision changes [  ];  Amaurosis fugax[  ]; Resp: cough [  ];  wheezing[  ];  hemoptysis[  ];  PND [  ];  GI:  gallstones[  ], vomiting[  ];  dysphagia[  ]; melena[  ];  hematochezia [  ]; heartburn[  ];   GU: kidney stones [  ]; hematuria[  ];   dysuria [  ];  nocturia[  ]; incontinence [  ];             Skin: rash, swelling[  ];, hair loss[  ];  peripheral edema[  ];  or itching[  ]; Musculosketetal: myalgias[  ];  joint swelling[  ];  joint erythema[  ];  joint pain[  ];  back pain[  ];  Heme/Lymph: bruising[  ];  bleeding[  ];  anemia[  ];  Neuro: TIA[  ];  headaches[  ];  stroke[  ];  vertigo[  ];  seizures[  ];   paresthesias[  ];  difficulty walking[  ];  Psych:depression[  ]; anxiety[  ];  Endocrine: diabetes[  ];  thyroid dysfunction[  ];  Other:  Past Medical History:  Diagnosis Date  . Hypertension     No prescriptions prior to admission.     Calvin Cochran. [START ON 02/02/2017] aspirin EC  81 mg Oral Daily  . atorvastatin  80 mg Oral q1800  . [START ON 02/02/2017] Influenza vac split quadrivalent PF  0.5 mL Intramuscular Tomorrow-1000    Infusions: . heparin      Allergies  Allergen Reactions  . Amoxicillin Itching    Social History   Social History  . Marital status: Married    Spouse name: N/A  . Number of children: N/A  . Years of education: N/A   Occupational History  . Not on file.   Social History Main Topics  . Smoking status: Never Smoker  . Smokeless tobacco: Never Used  . Alcohol use Yes     Comment: Occasional  . Drug use: No  . Sexual activity: Not on file   Other Topics Concern  . Not on file   Social History Narrative  . No narrative on file    Family History  Problem Relation Age of Onset  . Lung cancer Father     PHYSICAL EXAM: Vitals:   02/01/17 0043  BP: 131/71  Pulse: (!) 52  Resp: 16  Temp: 98.1 F (36.7 C)  SpO2: 100%    No intake or output data  in the 24 hours ending 02/01/17 [id number]  General:  Well appearing. No respiratory difficulty HEENT: normal Neck: supple. no JVD. Carotids 2+ bilat; no bruits. No lymphadenopathy or thryomegaly appreciated. Cor: PMI nondisplaced. Regular rate & rhythm. No rubs, gallops or murmurs. Lungs: clear Abdomen: soft, nontender, nondistended. No hepatosplenomegaly. No bruits or masses. Good bowel sounds. Extremities: no cyanosis, clubbing, rash, edema Neuro: alert & oriented x 3, cranial nerves grossly intact. moves all 4 extremities w/o difficulty. Affect pleasant.  ECG: sinus bradycardia with precordial T wave inversions, no acute ST elevations  Results for orders placed or performed during the hospital encounter of 02/01/17 (from the past 24 hour(s))  CBC     Status: Abnormal   Collection Time: 02/01/17  1:25 AM  Result Value Ref Range   WBC 10.1 4.0 - 10.5 K/uL   RBC 4.32 4.22 - 5.81 MIL/uL   Hemoglobin 13.1 13.0 - 17.0 g/dL   HCT 40.138.6 (L) 02.739.0 - 25.352.0 %   MCV 89.4 78.0 - 100.0 fL   MCH 30.3 26.0 - 34.0 pg   MCHC 33.9 30.0 - 36.0 g/dL   RDW 66.412.8 40.311.5 - 47.415.5 %   Platelets 199 150 - 400 K/uL  Creatinine, serum     Status: None   Collection Time: 02/01/17  1:25 AM  Result Value Ref Range   Creatinine, Ser 1.16 0.61 - 1.24 mg/dL   GFR calc non Af Amer >60 >60 mL/min   GFR calc Af Amer >60 >60 mL/min  Troponin I     Status: Abnormal   Collection Time: 02/01/17  1:25 AM  Result Value Ref Range   Troponin I 0.07 (HH) <0.03 ng/mL  Basic metabolic panel     Status: Abnormal   Collection Time: 02/01/17  1:25 AM  Result Value Ref Range   Sodium  138 135 - 145 mmol/L   Potassium 3.5 3.5 - 5.1 mmol/L   Chloride 103 101 - 111 mmol/L   CO2 27 22 - 32 mmol/L   Glucose, Bld 103 (H) 65 - 99 mg/dL   BUN 13 6 - 20 mg/dL   Creatinine, Ser 5.40 0.61 - 1.24 mg/dL   Calcium 8.5 (L) 8.9 - 10.3 mg/dL   GFR calc non Af Amer 58 (L) >60 mL/min   GFR calc Af Amer >60 >60 mL/min   Anion gap 8 5 - 15   No  results found.   ASSESSMENT/RECOMMENDATIONS: Shane Crutch is concerning for unstable angina, and first troponin here is now positive, consistent with a non-ST elevation MI. Now chest pain free. Does not have significant known risk factors, nondiabetic per history.  -changed lovenox to heparin -increased atorvastatin to 80 mg nightly -continue aspirin 81 mg. P2Y12 pending possible cath -NPO for possible cath today -echo -BP now improved with cessation of his pain, monitor -no beta blocker for now as he is bradycardic at baseline -ordered A1c, lipid panel  We will see him today to evaluate for cath. Please call with questions.  Jodelle Red, MD, PhD, overnight cardiology provider

## 2017-02-01 NOTE — Progress Notes (Signed)
Called in to patients room for c/o SOB and inability to catch his breath. Lungs  CTA LUL diminished. Nasal O2 at 2L for comfort. Coke given for possible Brilinta side affect.

## 2017-02-01 NOTE — Progress Notes (Signed)
ANTICOAGULATION CONSULT NOTE - Initial Consult  Pharmacy Consult for Lovenox>>Heparin Indication: chest pain/ACS  Allergies  Allergen Reactions  . Amoxicillin Itching    Patient Measurements: Height: 5\' 9"  (175.3 cm) Weight: 193 lb 11.2 oz (87.9 kg) IBW/kg (Calculated) : 70.7  Vital Signs: Temp: 98 F (36.7 C) (10/29 0616) Temp Source: Oral (10/29 0616) BP: 127/68 (10/29 0616) Pulse Rate: 57 (10/29 0616)  Labs:  Recent Labs  02/01/17 0125  HGB 13.1  HCT 38.6*  PLT 199  CREATININE 1.16  1.19  TROPONINI 0.07*    Estimated Creatinine Clearance: 61.3 mL/min (by C-G formula based on SCr of 1.16 mg/dL).   Medical History: Past Medical History:  Diagnosis Date  . Hypertension      Assessment: 76 y/o M transfer from outside hospital changing from Lovenox>>Heparin. Last dose Lovenox 11 hours ago.   Goal of Therapy:  Heparin level 0.3-0.7 units/ml Monitor platelets by anticoagulation protocol: Yes   Plan:  -Start heparin at 1200 units/hr at 0730 -1500 HL  Lonette Stevison 02/01/2017,6:26 AM

## 2017-02-02 ENCOUNTER — Encounter (HOSPITAL_COMMUNITY): Payer: Self-pay | Admitting: Cardiovascular Disease

## 2017-02-02 DIAGNOSIS — I200 Unstable angina: Secondary | ICD-10-CM

## 2017-02-02 LAB — BASIC METABOLIC PANEL
Anion gap: 8 (ref 5–15)
BUN: 13 mg/dL (ref 6–20)
CHLORIDE: 108 mmol/L (ref 101–111)
CO2: 23 mmol/L (ref 22–32)
Calcium: 9 mg/dL (ref 8.9–10.3)
Creatinine, Ser: 1.1 mg/dL (ref 0.61–1.24)
GFR calc Af Amer: >60 mL/min (ref >60)
GFR calc non Af Amer: >60 mL/min (ref >60)
GLUCOSE: 101 mg/dL — AB (ref 65–99)
POTASSIUM: 3.7 mmol/L (ref 3.5–5.1)
Sodium: 139 mmol/L (ref 135–145)

## 2017-02-02 LAB — CBC
HCT: 40.9 % (ref 39.0–52.0)
Hemoglobin: 14.1 g/dL (ref 13.0–17.0)
MCH: 30.7 pg (ref 26.0–34.0)
MCHC: 34.5 g/dL (ref 30.0–36.0)
MCV: 89.1 fL (ref 78.0–100.0)
Platelets: 214 10*3/uL (ref 150–400)
RBC: 4.59 MIL/uL (ref 4.22–5.81)
RDW: 12.9 % (ref 11.5–15.5)
WBC: 12 10*3/uL — ABNORMAL HIGH (ref 4.0–10.5)

## 2017-02-02 MED ORDER — TICAGRELOR 90 MG PO TABS: 90.0000 mg | ORAL_TABLET | Freq: Two times a day (BID) | ORAL | 1 refills | Status: DC

## 2017-02-02 MED ORDER — ATORVASTATIN CALCIUM 80 MG PO TABS: 80.0000 mg | ORAL_TABLET | Freq: Every day | ORAL | 0 refills | Status: DC

## 2017-02-02 MED ORDER — NITROGLYCERIN 0.4 MG SL SUBL: 0.4000 mg | SUBLINGUAL_TABLET | SUBLINGUAL | 0 refills | Status: DC | PRN

## 2017-02-02 MED ORDER — METOPROLOL TARTRATE 12.5 MG HALF TABLET
12.5000 mg | ORAL_TABLET | Freq: Two times a day (BID) | ORAL | Status: DC
  Administered 2017-02-02: 11:00:00 12.5 mg via ORAL
  Filled 2017-02-02: qty 1

## 2017-02-02 MED ORDER — TICAGRELOR 90 MG PO TABS: 90.0000 mg | ORAL_TABLET | Freq: Two times a day (BID) | ORAL | 0 refills | Status: DC

## 2017-02-02 MED ORDER — METOPROLOL TARTRATE 25 MG PO TABS: 12.5000 mg | ORAL_TABLET | Freq: Two times a day (BID) | ORAL | 0 refills | Status: DC

## 2017-02-02 MED FILL — Lidocaine HCl Local Inj 2%: INTRAMUSCULAR | Qty: 20 | Status: AC

## 2017-02-02 MED FILL — BRILINTA 90 MG TABLET: 90 | 30 days supply | Qty: 60 | Fill #0

## 2017-02-02 NOTE — Progress Notes (Signed)
Progress Note  Patient Name: Calvin Cochran Date of Encounter: 02/02/2017  Primary Cardiologist: Ellis Parents Sidney Ace)  Subjective   Feeling well this morning.   Inpatient Medications    Scheduled Meds: . angioplasty book   Does not apply Once  . aspirin  81 mg Oral Daily  . atorvastatin  80 mg Oral q1800  . Influenza vac split quadrivalent PF  0.5 mL Intramuscular Tomorrow-1000  . sodium chloride flush  3 mL Intravenous Q12H  . ticagrelor  90 mg Oral BID   Continuous Infusions: . sodium chloride     PRN Meds: sodium chloride, acetaminophen, ALPRAZolam, hydrALAZINE, morphine injection, nitroGLYCERIN, ondansetron (ZOFRAN) IV, sodium chloride flush   Vital Signs    Vitals:   02/02/17 0135 02/02/17 0200 02/02/17 0515 02/02/17 0700  BP:  (!) 127/54 (!) 153/70 (!) 163/81  Pulse:  82 82 88  Resp: 18 19 (!) 21 16  Temp:   97.9 F (36.6 C) 98.4 F (36.9 C)  TempSrc:   Oral Oral  SpO2:  96% 97% 98%  Weight:      Height:        Intake/Output Summary (Last 24 hours) at 02/02/17 0835 Last data filed at 02/02/17 0700  Gross per 24 hour  Intake           421.24 ml  Output             2075 ml  Net         -1653.76 ml   Filed Weights   02/01/17 0043 02/01/17 0616  Weight: 196 lb 14.4 oz (89.3 kg) 193 lb 11.2 oz (87.9 kg)    Telemetry    SR - Personally Reviewed  ECG    SR with TWI in septal leads - Personally Reviewed  Physical Exam   General: Well developed, well nourished, male appearing in no acute distress. Head: Normocephalic, atraumatic.  Neck: Supple without bruits, JVD. Lungs:  Resp regular and unlabored, CTA. Heart: RRR, S1, S2, no S3, S4, or murmur; no rub. Abdomen: Soft, non-tender, non-distended with normoactive bowel sounds. No hepatomegaly. No rebound/guarding. No obvious abdominal masses. Extremities: No clubbing, cyanosis, edema. Distal pedal pulses are 2+ bilaterally. Right radial cath site stable.  Neuro: Alert and oriented X 3. Moves all  extremities spontaneously. Psych: Normal affect.  Labs    Chemistry Recent Labs Lab 02/01/17 0125 02/02/17 0504  NA 138 139  K 3.5 3.7  CL 103 108  CO2 27 23  GLUCOSE 103* 101*  BUN 13 13  CREATININE 1.16  1.19 1.10  CALCIUM 8.5* 9.0  GFRNONAA >60  58* >60  GFRAA >60  >60 >60  ANIONGAP 8 8     Hematology Recent Labs Lab 02/01/17 0125 02/02/17 0504  WBC 10.1 12.0*  RBC 4.32 4.59  HGB 13.1 14.1  HCT 38.6* 40.9  MCV 89.4 89.1  MCH 30.3 30.7  MCHC 33.9 34.5  RDW 12.8 12.9  PLT 199 214    Cardiac Enzymes Recent Labs Lab 02/01/17 0125 02/01/17 0758 02/01/17 1224  TROPONINI 0.07* 0.05* 0.03*   No results for input(s): TROPIPOC in the last 168 hours.   BNPNo results for input(s): BNP, PROBNP in the last 168 hours.   DDimer No results for input(s): DDIMER in the last 168 hours.    Radiology    Portable Chest X-ray 1 View  Result Date: 02/01/2017 CLINICAL DATA:  Left-sided chest pain today associated with shortness of breath. History of NSTEMI, hypertension common never smoked. EXAM: PORTABLE  CHEST 1 VIEW COMPARISON:  None in PACs FINDINGS: The left lung is well-expanded and clear. On the right there is elevation of the hemidiaphragm. The aerated portion of the right lung is clear. The heart and pulmonary vascularity are normal. The mediastinum is normal in width. The bony thorax is unremarkable. IMPRESSION: Elevated right hemidiaphragm. Otherwise no acute cardiopulmonary abnormality. Electronically Signed   By: David  SwazilandJordan M.D.   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  1st Mrg lesion, 30 %stenosed.  2nd Diag-2 lesion, 90 %stenosed.  2nd Diag-1 lesion, 80 %stenosed.  Prox LAD to Mid LAD lesion, 95 %stenosed.  Post intervention, there is a 0% residual stenosis.  A stent was successfully placed.  The left ventricular systolic function is normal.  LV end diastolic pressure is  mildly elevated.  The left ventricular ejection fraction is 55-65% by visual estimate.   IMPRESSION: Successful proximal LAD PCI and drug-eluting stenting using a 3 mm x 20 mm long Synergy drug-eluting stent postdilated to 3.36 mm.  Patient had no other significant CAD.  His second diagonal branch was small and diffusely diseased.  Both the first and second diagonal branch which were crossed by the stent remained patent.  He had normal LV function.  He can be discharged home in the morning on dual antiplatelet therapy.  He should remain on DA PT for at least one year uninterrupted.  Nanetta BattyBerry, Jonathan. MD, [facility] ServicesFACC  Patient Profile     76 y.o. male  With no PMH who [facility]   Assessment & Plan    NSTEMI: Presented with chest pain and underwent cath with 95% mLAD lesion treated with PCI/DES x1. Plan for DAPT with ASA/Brilinta. Did have residual disease in the OM that will be treated medically. Post cath labs were stable. No chest pain. Worked well with cardiac rehab. Also started on high dose statin. Trop peaked only at 0.07. LV gram with normal EF.  -- ASA, Brilinta, statin. Will add low dose metoprolol 12.5mg  BID as HR now in the 70s, and blood pressures elevated. Instructed to follow HR/BP at home after discharge. Will arrange follow up in the [place] office.    Signed, Laverda PageLindsay Roberts, NP  02/02/2017, 8:35 AM  Pager # [phone]   For questions or updates, please contact [facility] Please consult [email or web address] for contact info under Cardiology/STEMI. Daytime calls, contact the Day Call APP (6a-8a) or assigned team (Teams A-D) provider (7:30a - 5p). All other daytime calls (7:30-5p), contact the Card Master @ [phone].   Nighttime calls, contact the assigned APP (5p-8p) or MD (6:30p-8p). Overnight calls (8p-6a), contact the on call Fellow @ [phone].  Patient seen and examined. Agree with assessment  and plan. No recurrent chest pain. Day 1 s/p PCI to LAD. 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 Continue DAPT; high potency statin Rx. OK for DC today with f/u in our  office.    Lennette Biharihomas A. Kelon Easom, MD, [facility]ACC 02/02/2017 8:43 AM

## 2017-02-02 NOTE — Progress Notes (Signed)
CARDIAC REHAB PHASE I   PRE:  Rate/Rhythm: 73 SR  BP:  Supine: 148/90  Sitting:   Standing:    SaO2: 98%RA  MODE:  Ambulation: 800 ft   POST:  Rate/Rhythm: 88 SR  BP:  Supine:   Sitting: 151/86  Standing:    SaO2: 99%RA 0920-1032 Pt walked 800 ft with steady gait and no CP. Tolerated well. MI education completed with pt and wife who voiced understanding. Stressed importance of brilinta with stent. Has seen case Production designer, theatre/television/filmmanager. Reviewed MI restrictions, NTG use, ex ed and heart healthy food choices and CRP2.  [facility]   Luetta Nuttingharlene Dontrez Pettis, RN BSN  02/02/2017 10:29 AM

## 2017-02-02 NOTE — Care Management Note (Signed)
Case Management Note  Patient Details  Name: Calvin Cochran MRN: [id number] Date of Birth: 02/09/1941  Subjective/Objective:   From home with wife, pta indep,s/p  Coronary stent intervention, will be on brilinta, patient states he does not have RX coverage but plan to apply for Medicare during this enrollment time.  NCM gave patient 30 day free savings  brilinta coupon, also gave him 3 sample bottles.  Lillia AbedLindsay PA called his [place] MD office to make sure they have samples there , they state they do.  Also patient will [facility] to get first 30 day free.  He is for dc today.                    Action/Plan: DC home with wife pta indep.   Expected Discharge Date:  02/02/17               Expected Discharge Plan:  Home/Self Care  In-House Referral:     Discharge planning Services  CM Consult  Post Acute Care Choice:    Choice offered to:     DME Arranged:    DME Agency:     HH Arranged:    HH Agency:     Status of Service:  Completed, signed off  If discussed at [employer] Length of Stay Meetings, dates discussed:    Additional Comments:  Leone Havenaylor, Khylan Sawyer Clinton, RN 02/02/2017, 9:37 AM

## 2017-02-02 NOTE — Discharge Summary (Signed)
Physician Discharge Summary  Calvin Cochran WGN:[id number] DOB: [birth year] DOA: 02/01/2017  PCP: Patient, No Pcp Per  Admit date: 02/01/2017 Discharge date: 02/02/2017   Recommendations for Outpatient Follow-Up:   1. Will need samples of brilinta- defer to cards 2. Monitor blood pressure- bring log to PCP 3. ASA/brilinta uninterrupted x 1 year   Discharge Diagnosis:   Principal Problem:   Chest pain Active Problems:   Essential hypertension   Hypertensive urgency   NSTEMI (non-ST elevated myocardial infarction) [facility])   Discharge disposition:  Home  Discharge Condition: Improved.  Diet recommendation: Low sodium, heart healthy  Wound care: None.   History of Present Illness:   Calvin Cochran is a 76 y.o. male with medical history significant for hypertension, not on any medications, and see department for evaluation of intermittent chest pain for the past 5 days.  Patient reports that beginning 5 days ago, he has been experiencing intermittent left-sided chest pain with radiation towards the right side, described as a pressure sensation, "8/10" in severity, and associated with dyspnea, fatigue, diaphoresis, and nausea.  This has occurred with exertion and without.  After the first episode 5 days ago, the patient used a push mower on his lawn and did not experience any of the symptoms.  Since that time, he has had multiple recurrences which seem to be worsening in severity.  Episode that prompted his ED visit tonight was the most severe.   Hospital Course by Problem:   NSTEMI: Presented with chest pain and underwent cath with 95% mLAD lesion treated with PCI/DES x1. Plan for DAPT with ASA/Brilinta x 1 year uninterrupted.  -LV gram with normal EF.  -- ASA, Brilinta, statin -low dose metoprolol 12.5mg  BID as HR now in the 70s, and blood pressures elevated. Instructed to follow HR/BP at home after discharge. Will arrange follow up in the [place] office.    Medical  Consultants:    cards   Discharge Exam:   Vitals:   02/02/17 0515 02/02/17 0700  BP: (!) 153/70 (!) 163/81  Pulse: 82 88  Resp: (!) 21 16  Temp: 97.9 F (36.6 C) 98.4 F (36.9 C)  SpO2: 97% 98%   Vitals:   02/02/17 0135 02/02/17 0200 02/02/17 0515 02/02/17 0700  BP:  (!) 127/54 (!) 153/70 (!) 163/81  Pulse:  82 82 88  Resp: 18 19 (!) 21 16  Temp:   97.9 F (36.6 C) 98.4 F (36.9 C)  TempSrc:   Oral Oral  SpO2:  96% 97% 98%  Weight:      Height:        Gen:  NAD    The results of significant diagnostics from this hospitalization (including imaging, microbiology, ancillary and laboratory) are listed below for reference.     Procedures and Diagnostic Studies:   Portable Chest X-ray 1 View  Result Date: 02/01/2017 CLINICAL DATA:  Left-sided chest pain today associated with shortness of breath. History of NSTEMI, hypertension common never smoked. EXAM: PORTABLE CHEST 1 VIEW COMPARISON:  None in PACs FINDINGS: The left lung is well-expanded and clear. On the right there is elevation of the hemidiaphragm. The aerated portion of the right lung is clear. The heart and pulmonary vascularity are normal. The mediastinum is normal in width. The bony thorax is unremarkable. IMPRESSION: Elevated right hemidiaphragm. Otherwise no acute cardiopulmonary abnormality. Electronically Signed   By: David  [place]az[place]and M.D.   On: 02/01/2017 07:24     Labs:   Basic Metabolic Panel:  Recent Labs Lab  02/01/17 0125 02/02/17 0504  NA 138 139  K 3.5 3.7  CL 103 108  CO2 27 23  GLUCOSE 103* 101*  BUN 13 13  CREATININE 1.16  1.19 1.10  CALCIUM 8.5* 9.0   GFR Estimated Creatinine Clearance: 64.7 mL/min (by C-G formula based on SCr of 1.1 mg/dL). Liver Function Tests: No results for input(s): AST, ALT, ALKPHOS, BILITOT, PROT, ALBUMIN in the last 168 hours. No results for input(s): LIPASE, AMYLASE in the last 168 hours. No results for input(s): AMMONIA in the last 168  hours. Coagulation profile  Recent Labs Lab 02/01/17 1224  INR 1.07    CBC:  Recent Labs Lab 02/01/17 0125 02/02/17 0504  WBC 10.1 12.0*  HGB 13.1 14.1  HCT 38.6* 40.9  MCV 89.4 89.1  PLT 199 214   Cardiac Enzymes:  Recent Labs Lab 02/01/17 0125 02/01/17 0758 02/01/17 1224  TROPONINI 0.07* 0.05* 0.03*   BNP: Invalid input(s): POCBNP CBG: No results for input(s): GLUCAP in the last 168 hours. D-Dimer No results for input(s): DDIMER in the last 72 hours. Hgb A1c  Recent Labs  02/01/17 0758  HGBA1C 5.3   Lipid Profile  Recent Labs  02/01/17 0758  CHOL 193  HDL 43  LDLCALC 127*  TRIG 115  CHOLHDL 4.5   Thyroid function studies No results for input(s): TSH, T4TOTAL, T3FREE, THYROIDAB in the last 72 hours.  Invalid input(s): FREET3 Anemia work up No results for input(s): VITAMINB12, FOLATE, FERRITIN, TIBC, IRON, RETICCTPCT in the last 72 hours. Microbiology No results found for this or any previous visit (from the past 240 hour(s)).   Discharge Instructions:   Discharge Instructions    AMB Referral to Cardiac Rehabilitation - Phase II    Complete by:  As directed    Diagnosis:  NSTEMI   Diet - low sodium heart healthy    Complete by:  As directed    Increase activity slowly    Complete by:  As directed      Allergies as of 02/02/2017      Reactions   Amoxicillin Itching      Medication List    STOP taking these medications   esomeprazole 20 MG capsule Commonly known as:  NEXIUM     TAKE these medications   aspirin EC 81 MG tablet Take 81 mg by mouth daily as needed for mild pain.   atorvastatin 80 MG tablet Commonly known as:  LIPITOR Take 1 tablet (80 mg total) by mouth daily at 6 PM.   metoprolol tartrate 25 MG tablet Commonly known as:  LOPRESSOR Take 0.5 tablets (12.5 mg total) by mouth 2 (two) times daily.   nitroGLYCERIN 0.4 MG SL tablet Commonly known as:  NITROSTAT Place 1 tablet (0.4 mg total) under the tongue  every 5 (five) minutes x 3 doses as needed for chest pain.   ticagrelor 90 MG Tabs tablet Commonly known as:  BRILINTA Take 1 tablet (90 mg total) by mouth 2 (two) times daily.      Follow-up Information    Jodelle GrossLawrence, Kathryn M, NP Follow up on 02/09/2017.   Specialties:  Nurse Practitioner, Radiology, Cardiology Why:  at 3pm for your follow up appt.  Contact information: [address] [place] [id number] [phone]            Time coordinating discharge: 35 min  Signed:  Haidyn Kilburg U Melanny Wire   [facility] 02/02/2017, 9:29 AM

## 2017-02-08 NOTE — Progress Notes (Signed)
Cardiology Office Note   Date:  02/09/2017   ID:  Calvin Cochran, DOB 07/11/1940, MRN [id number]  PCP:  Patient, No Pcp Per  Cardiologist[facility] Chief Complaint  Patient presents with  . Coronary Artery Disease  . Hospitalization Follow-up      History of Present Illness: Calvin Cochran is a 76 y.o. male who presents for posthospitalization follow-up after admission for non-ST elevation MI, with hypertensive urgency.  The patient subsequently had cardiac catheterization which revealed a 95% mid LAD lesion which was treated with a PCI/drug-eluting stent x1.  The patient was started on aspirin and Brilinta which he will continue for 1 year uninterrupted.  LV gram revealed normal LV systolic function.  He was also started on low-dose metoprolol 12.5 mg twice daily, statin therapy.   Conclusion Cardiac Cath 02/01/2017    RPDA lesion, 50 %stenosed.  1st Mrg lesion, 30 %stenosed.  2nd Diag-2 lesion, 90 %stenosed.  2nd Diag-1 lesion, 80 %stenosed.  Prox LAD to Mid LAD lesion, 95 %stenosed.  Post intervention, there is a 0% residual stenosis.  A stent was successfully placed.  The left ventricular systolic function is normal.  LV end diastolic pressure is mildly elevated.  The left ventricular ejection fraction is 55-65% by visual estimate.    He is a very pleasant man who comes today with his wife feeling very well post hospitalization after catheterization.  He did have some sluggishness when beginning metoprolol, but that has subsided.  He is tolerating Brilinta atorvastatin and aspirin without any bleeding or myalgias.  Note, his anginal equivalent was sharp pain in his axillary area.  He thought it may be musculoskeletal but was happy that he was able to check it out and receive treatment.   He is a former triathlon, and cyclist.  Not been exercising over the last 2 years however.  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  He  wishes to be established in our [place] office which is closer to his home.  Past Medical History:  Diagnosis Date  . Hypertension     History reviewed. No pertinent surgical history.   Current Outpatient Medications  Medication Sig Dispense Refill  . aspirin EC 81 MG tablet Take 81 mg by mouth daily as needed for mild pain.    [name]. atorvastatin (LIPITOR) 80 MG tablet Take 1 tablet (80 mg total) by mouth daily at 6 PM. 30 tablet 0  . metoprolol tartrate (LOPRESSOR) 25 MG tablet Take 0.5 tablets (12.5 mg total) by mouth 2 (two) times daily. 60 tablet 0  . nitroGLYCERIN (NITROSTAT) 0.4 MG SL tablet Place 1 tablet (0.4 mg total) under the tongue every 5 (five) minutes x 3 doses as needed for chest pain. 10 tablet 0  . ticagrelor (BRILINTA) 90 MG TABS tablet Take 1 tablet (90 mg total) by mouth 2 (two) times daily. 60 tablet 0   No current facility-administered medications for this visit.     Allergies:   Amoxicillin    Social History:  The patient  reports that  has never smoked. he has never used smokeless tobacco. He reports that he drinks alcohol. He reports that he does not use drugs.   Family History:  [name] family history includes Lung cancer in his father.    ROS: All other systems are reviewed and negative. Unless otherwise mentioned in H&P    PHYSICAL EXAM: VS:  Ht 5\' 9"  (1.753 m)   Wt 195 lb (88.5 kg)  BMI 28.80 kg/m  , BMI Body mass index is 28.8 kg/m. GEN: Well nourished, well developed, in no acute distress  HEENT: normal  Neck: no JVD, carotid bruits, or masses Cardiac: RRR; no murmurs, rubs, or gallops,no edema  Respiratory:  clear to auscultation bilaterally, normal work of breathing GI: soft, nontender, nondistended, + BS MS: no deformity or atrophy right wrist catheter insertion site is well-healed with mild ecchymosis noted.  No bleeding Skin: warm and dry, no rash Neuro:  Strength and sensation are intact Psych: euthymic mood, full affect   Recent  Labs: 02/02/2017: BUN 13; Creatinine, Ser 1.10; Hemoglobin 14.1; Platelets 214; Potassium 3.7; Sodium 139    Lipid Panel    Component Value Date/Time   CHOL 193 02/01/2017 0758   TRIG 115 02/01/2017 0758   HDL 43 02/01/2017 0758   CHOLHDL 4.5 02/01/2017 0758   VLDL 23 02/01/2017 0758   LDLCALC 127 (H) 02/01/2017 0758      Wt Readings from Last 3 Encounters:  02/09/17 195 lb (88.5 kg)  02/01/17 193 lb 11.2 oz (87.9 kg)     ASSESSMENT AND PLAN:  1.  Coronary artery disease: 95% proximal LAD stenosis status post PCI.  He had residual stenosis in the second diagonal and first diagonal and 90% 80% respectively.  Intervention was necessary at that time.  He remains on dual antiplatelet therapy with Brilinta and aspirin.  He offers no side effects from this medication.  He continues on metoprolol 12.5 mg twice daily.  Initially had some sluggishness which has subsided.  He is interested in cardiac rehab.  He used to be very athletic up until 2 years ago.  He wishes to be established in our [facility] office, as this is closer to his home in [place].  He will be assigned to Dr. Nona Dell.  He was see him for the first time in 3 months.  I have given him copies of his cardiac catheterization illustration for his own records.  2.  Hypercholesterolemia: He has been placed on atorvastatin 80 mg daily.  He does ask about co-Q10.  Have advised that this is okay for him to take should he have myalgia pain.  Currently he is asymptomatic.  Follow-up labs in 3 months.   Current medicines are reviewed at length with the patient today.    Labs/ tests ordered today include:  Calvin Cochran. Calvin Cochran, ANP, AACC   02/09/2017 2:58 PM    [facility] [address], [place], [place] [address] Phone: [phone]; Fax: [phone]

## 2017-02-09 ENCOUNTER — Encounter: Payer: Self-pay | Admitting: Adult Health

## 2017-02-09 ENCOUNTER — Ambulatory Visit: Payer: Medicare PPO | Admitting: Adult Health

## 2017-02-09 VITALS — BP 126/84 | HR 74 | Ht 69.0 in | Wt 195.0 lb

## 2017-02-09 DIAGNOSIS — I2510 Atherosclerotic heart disease of native coronary artery without angina pectoris: Secondary | ICD-10-CM

## 2017-02-09 DIAGNOSIS — E7800 Pure hypercholesterolemia, unspecified: Secondary | ICD-10-CM | POA: Diagnosis not present

## 2017-02-09 MED ORDER — ATORVASTATIN CALCIUM 80 MG PO TABS
80.0000 mg | ORAL_TABLET | Freq: Every day | ORAL | 1 refills | Status: DC
Start: 2017-02-09 — End: 2017-05-19

## 2017-02-09 MED ORDER — METOPROLOL TARTRATE 25 MG PO TABS: 12.5000 mg | ORAL_TABLET | Freq: Two times a day (BID) | ORAL | 1 refills | Status: DC

## 2017-02-09 NOTE — Patient Instructions (Addendum)
Your physician recommends that you schedule a follow-up appointment in: [facility] with [facility].McDowell    I refilled your Atorvastatin and Metoprolol   Samples Brilinta 90 mg, two bottles- 8 tabs each, lot # [id number], exp 07/2019  No lab work or tests ordered today.   Your physician recommends that you continue on your current medications as directed. Please refer to the Current Medication list given to you today.       Thank you for choosing [facility] !

## 2017-02-10 ENCOUNTER — Telehealth: Payer: Self-pay | Admitting: Adult Health

## 2017-02-10 ENCOUNTER — Encounter: Payer: Self-pay | Admitting: *Deleted

## 2017-02-10 NOTE — Telephone Encounter (Signed)
Pt states that supervisor told him that he will need a to return to work.

## 2017-02-10 NOTE — Telephone Encounter (Signed)
Per pt phone call--he's needing a return to work note to be faxed to his employer @  [phone]

## 2017-02-11 ENCOUNTER — Encounter: Payer: Self-pay | Admitting: *Deleted

## 2017-02-11 NOTE — Telephone Encounter (Signed)
Please give him a "May return to work on Monday, February 15, 2017 without restrictions." letter for his work.

## 2017-02-11 NOTE — Telephone Encounter (Signed)
Letter complete and faxed.

## 2017-04-09 ENCOUNTER — Other Ambulatory Visit: Payer: Self-pay

## 2017-04-09 ENCOUNTER — Other Ambulatory Visit: Payer: Self-pay | Admitting: Cardiology

## 2017-04-09 MED ORDER — TICAGRELOR 90 MG PO TABS: 90.0000 mg | ORAL_TABLET | Freq: Two times a day (BID) | ORAL | 1 refills | Status: DC

## 2017-04-09 MED ORDER — TICAGRELOR 90 MG PO TABS: 90.0000 mg | ORAL_TABLET | Freq: Two times a day (BID) | ORAL | 3 refills | Status: DC

## 2017-04-09 NOTE — Telephone Encounter (Signed)
Done

## 2017-04-09 NOTE — Telephone Encounter (Signed)
° ° °  1. Which medications need to be refilled? (please list name of each medication and dose if known)ticagrelor (BRILINTA) 90 MG TABS    2. Which pharmacy/location (including street and city if local pharmacy) is medication to be sent to   [facility]  [place] [place]   3. Do they need a 30 day or 90 day supply?

## 2017-05-17 ENCOUNTER — Encounter: Payer: Self-pay | Admitting: Cardiology

## 2017-05-17 NOTE — Progress Notes (Signed)
Cardiology Office Note  Date: 05/19/2017   ID: Calvin GalasMichael Vandyken, DOB 07/19/1940, MRN [id number]  PCP: Patient, No Pcp Per  Evaluating cardiologist: Nona DellSamuel McDowell, MD   Chief Complaint  Patient presents with  . Coronary Artery Disease    History of Present Illness: Calvin Cochran is a 77 y.o. male that I am meeting in clinic for the first time today.  He was most recently seen by Ms. Liborio NixonLawrence DNP in November 2018.  I reviewed records and updated the chart.  He states that he has been doing well overall, no angina symptoms.  He is exercising 3 days a week at a gym in [place].  He was previously an avid cyclist, also did triathlons at a younger age.  Cardiac history includes NSTEMI in October 2018 managed with DES to the mid LAD, otherwise with mild to moderate residual disease that was managed medically.  LVEF was normal by left ventriculography.  He continues on aspirin and Brilinta.  Has had some mild shortness of breath on Brilinta but not limiting.  We went over his lipid panel from October 2018 and discussed obtaining follow-up labs.  He has tolerated high-dose Lipitor.  We also discussed general pathophysiology of atherosclerotic coronary disease.  Past Medical History:  Diagnosis Date  . CAD (coronary artery disease)    DES to mid LAD October 2018  . Essential hypertension   . NSTEMI (non-ST elevated myocardial infarction) (HCC) 01/2017    Past Surgical History:  Procedure Laterality Date  . CORONARY STENT INTERVENTION N/A 02/01/2017   Procedure: CORONARY STENT INTERVENTION;  Surgeon: Runell GessBerry, Jonathan J, MD;  [facility];  Service: Cardiovascular;  Laterality: N/A;  LAD Prox-Mid lesion stented  . LEFT HEART CATH AND CORONARY ANGIOGRAPHY N/A 02/01/2017   Procedure: LEFT HEART CATH AND CORONARY ANGIOGRAPHY;  Surgeon: Runell GessBerry, Jonathan J, MD;  Location: [facility];  Service: Cardiovascular;  Laterality: N/A;    Current Outpatient Medications    Medication Sig Dispense Refill  . aspirin EC 81 MG tablet Take 81 mg by mouth daily as needed for mild pain.    [name]. atorvastatin (LIPITOR) 80 MG tablet Take 1 tablet (80 mg total) by mouth daily at 6 PM. 90 tablet 1  . metoprolol tartrate (LOPRESSOR) 25 MG tablet Take 0.5 tablets (12.5 mg total) 2 (two) times daily by mouth. 90 tablet 1  . nitroGLYCERIN (NITROSTAT) 0.4 MG SL tablet Place 1 tablet (0.4 mg total) under the tongue every 5 (five) minutes x 3 doses as needed for chest pain. 10 tablet 0  . ticagrelor (BRILINTA) 90 MG TABS tablet Take 1 tablet (90 mg total) by mouth 2 (two) times daily. 180 tablet 1   No current facility-administered medications for this visit.    Allergies:  Amoxicillin   Social History: The patient  reports that  has never smoked. he has never used smokeless tobacco. He reports that he drinks alcohol. He reports that he does not use drugs.   ROS:  Please see the history of present illness. Otherwise, complete review of systems is positive for none.  All other systems are reviewed and negative.   Physical Exam: VS:  BP 118/84   Pulse (!) 55   Ht 5\' 9"  (1.753 m)   Wt 199 lb (90.3 kg)   SpO2 98%   BMI 29.39 kg/m , BMI Body mass index is 29.39 kg/m.  Wt Readings from Last 3 Encounters:  05/19/17 199 lb (90.3 kg)  02/09/17 195  lb (88.5 kg)  02/01/17 193 lb 11.2 oz (87.9 kg)    General: Patient appears comfortable at rest. HEENT: Conjunctiva and lids normal, oropharynx clear. Neck: Supple, no elevated JVP or carotid bruits, no thyromegaly. Lungs: Clear to auscultation, nonlabored breathing at rest. Cardiac: Regular rate and rhythm, no S3 or significant systolic murmur, no pericardial rub. Abdomen: Soft, nontender, bowel sounds present. Extremities: No pitting edema, distal pulses 2+. Skin: Warm and dry. Musculoskeletal: No kyphosis. Neuropsychiatric: Alert and oriented x3, affect grossly appropriate.  ECG: I personally reviewed the tracing from  02/02/2017 which showed sinus rhythm with diffuse nonspecific ST-T wave changes, likely anterior ischemia.  Recent Labwork: 02/02/2017: BUN 13; Creatinine, Ser 1.10; Hemoglobin 14.1; Platelets 214; Potassium 3.7; Sodium 139     Component Value Date/Time   CHOL 193 02/01/2017 0758   TRIG 115 02/01/2017 0758   HDL 43 02/01/2017 0758   CHOLHDL 4.5 02/01/2017 0758   VLDL 23 02/01/2017 0758   LDLCALC 127 (H) 02/01/2017 0758    Other Studies Reviewed Today:  Cardiac catheterization and PCI 02/01/2017:  RPDA lesion, 50 %stenosed.  1st Mrg lesion, 30 %stenosed.  2nd Diag-2 lesion, 90 %stenosed.  2nd Diag-1 lesion, 80 %stenosed.  Prox LAD to Mid LAD lesion, 95 %stenosed.  Post intervention, there is a 0% residual stenosis.  A stent was successfully placed.  The left ventricular systolic function is normal.  LV end diastolic pressure is mildly elevated.  The left ventricular ejection fraction is 55-65% by visual estimate.  Assessment and Plan:  1.  CAD with history of DES to the mid LAD in October 2018 in the setting of NSTEMI.  He is doing well clinically, exercising regularly, reports compliance with his medications.  He will continue on dual antiplatelet therapy for now.  2.  Mild hyperlipidemia, LDL 127 in October 2018.  He has continued on Lipitor 80 mg daily and we will obtain follow-up FLP and LFTs.  3.  Health maintenance.  I recommended that he establish with a PCP.  Current medicines were reviewed with the patient today.   Orders Placed This Encounter  Procedures  . Hepatic function panel  . Lipid Profile    Disposition: Follow-up in 6 months.  Signed, Jonelle Sidle, MD, [facility] 05/19/2017 2:40 PM    [facility] at [facility] [address] [place], [place], [place] [address] Phone: [phone]; Fax: [phone]

## 2017-05-19 ENCOUNTER — Ambulatory Visit: Payer: Medicare PPO | Admitting: Cardiology

## 2017-05-19 ENCOUNTER — Encounter: Payer: Self-pay | Admitting: Cardiology

## 2017-05-19 VITALS — BP 118/84 | HR 55 | Ht 69.0 in | Wt 199.0 lb

## 2017-05-19 DIAGNOSIS — I25119 Atherosclerotic heart disease of native coronary artery with unspecified angina pectoris: Secondary | ICD-10-CM | POA: Diagnosis not present

## 2017-05-19 DIAGNOSIS — E7800 Pure hypercholesterolemia, unspecified: Secondary | ICD-10-CM

## 2017-05-19 MED ORDER — ATORVASTATIN CALCIUM 80 MG PO TABS: 80.0000 mg | ORAL_TABLET | Freq: Every day | ORAL | 1 refills | Status: DC

## 2017-05-19 NOTE — Patient Instructions (Signed)
Medication Instructions:  Your physician recommends that you continue on your current medications as directed. Please refer to the Current Medication list given to you today.  Labwork:  LIVER,LIPIDS  You must be fasting  Orders given today  Testing/Procedures: NONE  Follow-Up: Your physician wants you to follow-up in: 6 MONTHS WITH DR. Diona BrownerMCDOWELL You will receive a reminder letter in the mail two months in advance. If you don't receive a letter, please call our office to schedule the follow-up appointment.  Any Other Special Instructions Will Be Listed Below (If Applicable).  If you need a refill on your cardiac medications before your next appointment, please call your pharmacy.

## 2017-06-01 ENCOUNTER — Telehealth: Payer: Self-pay

## 2017-06-01 NOTE — Telephone Encounter (Signed)
Patient notified. No PCP  

## 2017-06-01 NOTE — Telephone Encounter (Signed)
-----   Message from Eustace MooreLydia M Anderson, LPN sent at 1/61/09602/26/2019 10:25 AM EST -----   ----- Message ----- From: Jonelle SidleMcDowell, Samuel G, MD Sent: 06/01/2017  10:24 AM To: No Pcp Per Patient, Eustace MooreLydia M Anderson, LPN  Results reviewed.  AST and ALT are normal.  Cholesterol has come down nicely with LDL 72 at this point.  Continue current regimen. A copy of this test should be forwarded to Patient, No Pcp Per.

## 2017-10-05 ENCOUNTER — Other Ambulatory Visit: Payer: Self-pay | Admitting: *Deleted

## 2017-10-05 MED ORDER — METOPROLOL TARTRATE 25 MG PO TABS: 12.5000 mg | ORAL_TABLET | Freq: Two times a day (BID) | ORAL | 1 refills | Status: DC

## 2017-10-25 ENCOUNTER — Other Ambulatory Visit: Payer: Self-pay | Admitting: *Deleted

## 2017-10-25 MED ORDER — TICAGRELOR 90 MG PO TABS: 90.0000 mg | ORAL_TABLET | Freq: Two times a day (BID) | ORAL | 1 refills | Status: DC

## 2017-11-19 ENCOUNTER — Ambulatory Visit: Payer: Medicare PPO | Admitting: Cardiology

## 2018-01-06 NOTE — Progress Notes (Signed)
Cardiology Office Note  Date: 01/07/2018   ID: Calvin Cochran, DOB [birth year], MRN [id number]  PCP: System, Pcp Not In  Primary Cardiologist: Nona Dell, MD   Chief Complaint  Patient presents with  . Coronary Artery Disease    History of Present Illness: Calvin Cochran is a 77 y.o. male last seen in February.  He is here for a routine follow-up visit.  Reports no angina symptoms.  Does describe generalized fatigue.  Still functional with ADLs, he enjoys hunting, states that he was hunting bears and [place] most recently and is now starting into deer season.  DES intervention to the LAD was in October 2018.  He has continued on dual antiplatelet therapy since then.  We discussed stopping Brilinta after this month's course is completed.  Follow-up lab work from February of this year as outlined below.  I personally reviewed his ECG today which shows marked sinus bradycardia with lead artifact.  We also discussed stopping his Lopressor completely given low resting heart rate and fatigue.  Past Medical History:  Diagnosis Date  . CAD (coronary artery disease)    DES to mid LAD October 2018  . Essential hypertension   . NSTEMI (non-ST elevated myocardial infarction) (HCC) 01/2017    Past Surgical History:  Procedure Laterality Date  . CORONARY STENT INTERVENTION N/A 02/01/2017   Procedure: CORONARY STENT INTERVENTION;  Surgeon: Runell Gess, MD;  [facility];  Service: Cardiovascular;  Laterality: N/A;  LAD Prox-Mid lesion stented  . LEFT HEART CATH AND CORONARY ANGIOGRAPHY N/A 02/01/2017   Procedure: LEFT HEART CATH AND CORONARY ANGIOGRAPHY;  Surgeon: Runell Gess, MD;  [facility];  Service: Cardiovascular;  Laterality: N/A;    Current Outpatient Medications  Medication Sig Dispense Refill  . aspirin EC 81 MG tablet Take 81 mg by mouth daily as needed for mild pain.    [name] atorvastatin (LIPITOR) 80 MG tablet Take 1 tablet (80 mg  total) by mouth daily at 6 PM. 90 tablet 1  . nitroGLYCERIN (NITROSTAT) 0.4 MG SL tablet Place 1 tablet (0.4 mg total) under the tongue every 5 (five) minutes x 3 doses as needed for chest pain. 10 tablet 0  . ticagrelor (BRILINTA) 90 MG TABS tablet Take 1 tablet (90 mg total) by mouth 2 (two) times daily for 27 days. 180 tablet 1   No current facility-administered medications for this visit.    Allergies:  Amoxicillin   Social History: The patient  reports that he has never smoked. He has never used smokeless tobacco. He reports that he drinks alcohol. He reports that he does not use drugs.   ROS:  Please see the history of present illness. Otherwise, complete review of systems is positive for none.  All other systems are reviewed and negative.   Physical Exam: VS:  BP (!) 167/88   Pulse (!) 44   Ht 5\' 9"  (1.753 m)   Wt 200 lb (90.7 kg)   BMI 29.53 kg/m , BMI Body mass index is 29.53 kg/m.  Wt Readings from Last 3 Encounters:  01/07/18 200 lb (90.7 kg)  05/19/17 199 lb (90.3 kg)  02/09/17 195 lb (88.5 kg)    General: Patient appears comfortable at rest. HEENT: Conjunctiva and lids normal, oropharynx clear. Neck: Supple, no elevated JVP or carotid bruits, no thyromegaly. Lungs: Clear to auscultation, nonlabored breathing at rest. Cardiac: Regular rate and rhythm, no S3 or significant systolic murmur. Abdomen: Soft, nontender, bowel sounds  present. Extremities: No pitting edema, distal pulses 2+. Skin: Warm and dry. Musculoskeletal: No kyphosis. Neuropsychiatric: Alert and oriented x3, affect grossly appropriate.  ECG: I personally reviewed the tracing from 02/02/2017 which showed sinus rhythm with diffuse nonspecific ST-T changes, likely anterior ischemia.  Recent Labwork: 02/02/2017: BUN 13; Creatinine, Ser 1.10; Hemoglobin 14.1; Platelets 214; Potassium 3.7; Sodium 139     Component Value Date/Time   CHOL 193 02/01/2017 0758   TRIG 115 02/01/2017 0758   HDL 43  02/01/2017 0758   CHOLHDL 4.5 02/01/2017 0758   VLDL 23 02/01/2017 0758   LDLCALC 127 (H) 02/01/2017 0758  February 2019: AST 24, ALT 23, cholesterol 143, triglycerides 72, HDL 57, LDL 72  Other Studies Reviewed Today:  Cardiac catheterization and PCI 02/01/2017:  RPDA lesion, 50 %stenosed.  1st Mrg lesion, 30 %stenosed.  2nd Diag-2 lesion, 90 %stenosed.  2nd Diag-1 lesion, 80 %stenosed.  Prox LAD to Mid LAD lesion, 95 %stenosed.  Post intervention, there is a 0% residual stenosis.  A stent was successfully placed.  The left ventricular systolic function is normal.  LV end diastolic pressure is mildly elevated.  The left ventricular ejection fraction is 55-65% by visual estimate.  Assessment and Plan:  1.  CAD status post DES to the mid LAD in October 2018.  He is doing well without angina symptoms.  With fatigue and low resting heart rate we will stop Lopressor altogether.  He will also complete a one-year course of Brilinta at the end of this month.  2.  Mixed hyperlipidemia, continues on Lipitor with follow-up LDL 72 down from 127.  Follow-up FLP and LFTs for next visit.  3.  Essential hypertension.  Not following with PCP.  He reports compliance with medications.  We discussed sodium restriction and diet.  He may need another agent such as ARB.  Current medicines were reviewed with the patient today.   Orders Placed This Encounter  Procedures  . EKG 12-Lead    Disposition: Follow-up in 6 months.  Signed, Jonelle Sidle, MD, [facility] 01/07/2018 9:58 AM    [facility] at [facility] [address] [place], [place], [place] [address] Phone: [phone]; Fax: [phone]

## 2018-01-07 ENCOUNTER — Ambulatory Visit: Payer: Medicare PPO | Admitting: Cardiology

## 2018-01-07 ENCOUNTER — Encounter: Payer: Self-pay | Admitting: Cardiology

## 2018-01-07 VITALS — BP 167/88 | HR 44 | Ht 69.0 in | Wt 200.0 lb

## 2018-01-07 DIAGNOSIS — E782 Mixed hyperlipidemia: Secondary | ICD-10-CM

## 2018-01-07 DIAGNOSIS — I10 Essential (primary) hypertension: Secondary | ICD-10-CM | POA: Diagnosis not present

## 2018-01-07 DIAGNOSIS — I25119 Atherosclerotic heart disease of native coronary artery with unspecified angina pectoris: Secondary | ICD-10-CM | POA: Diagnosis not present

## 2018-01-07 MED ORDER — TICAGRELOR 90 MG PO TABS: 90.0000 mg | ORAL_TABLET | Freq: Two times a day (BID) | ORAL | 1 refills | Status: AC

## 2018-01-07 NOTE — Patient Instructions (Addendum)
Medication Instructions:   Your physician has recommended you make the following change in your medication:   Stop metoprolol tartrate (lopressor)  You may stop brilinta at the end of this month. You will not need any more refills on this medication.  Continue all other medications the same.  If you need a refill on your cardiac medications before your next appointment, please call your pharmacy.   Lab work:  Your physician recommends that you return for a FASTING lipid & liver profile: in 6 months just before your next visit. You will be notified when this is due.  If you have labs (blood work) drawn today and your tests are completely normal, you will receive your results only by: [name] MyChart Message (if you have MyChart) OR . A paper copy in the mail If you have any lab test that is abnormal or we need to change your treatment, we will call you to review the results.  Testing/Procedures:  NONE  Follow-Up:  Your physician recommends that you schedule a follow-up appointment in: 6 months. You will receive a reminder letter in the mail in about 4 months reminding you to call and schedule your appointment. If you don't receive this letter, please contact our office. At [facility], you and your health needs are our priority.  As part of our continuing mission to provide you with exceptional heart care, we have created designated Provider Care Teams.  These Care Teams include your primary Cardiologist (physician) and Advanced Practice Providers (APPs -  Physician Assistants and Nurse Practitioners) who all work together to provide you with the care you need, when you need it. .   Any Other Special Instructions Will Be Listed Below (If Applicable).

## 2018-06-14 ENCOUNTER — Other Ambulatory Visit: Payer: Self-pay | Admitting: *Deleted

## 2018-06-14 DIAGNOSIS — Z79899 Other long term (current) drug therapy: Secondary | ICD-10-CM

## 2018-06-14 DIAGNOSIS — E782 Mixed hyperlipidemia: Secondary | ICD-10-CM

## 2018-06-14 NOTE — Progress Notes (Signed)
Lab orders mailed to patient. He says he will have this done at [facility].

## 2018-07-14 ENCOUNTER — Telehealth: Payer: Self-pay | Admitting: *Deleted

## 2018-07-14 NOTE — Telephone Encounter (Signed)
Pt contacted per Dr Wyline Mood. History reviewed. No symptoms to suggest any unstable cardiac conditions. Based on discussion, with current pandemic situation, we have postponed 07/22/18 appointment until July 2020. If symptoms change, pt has been instructed to contact our office. Pt offered and declined virtual appt (lab work hasn't been done yet and pt is going to wait until closer to July before he gets this done)

## 2018-07-22 ENCOUNTER — Ambulatory Visit: Payer: Medicare PPO | Admitting: Cardiology

## 2018-10-13 ENCOUNTER — Encounter: Payer: Self-pay | Admitting: *Deleted

## 2018-10-13 ENCOUNTER — Telehealth: Payer: Self-pay | Admitting: Cardiology

## 2018-10-13 NOTE — Telephone Encounter (Signed)

## 2018-10-14 ENCOUNTER — Other Ambulatory Visit: Payer: Self-pay

## 2018-10-14 ENCOUNTER — Ambulatory Visit (INDEPENDENT_AMBULATORY_CARE_PROVIDER_SITE_OTHER): Payer: Medicare PPO | Admitting: Cardiology

## 2018-10-14 ENCOUNTER — Encounter: Payer: Self-pay | Admitting: Cardiology

## 2018-10-14 VITALS — BP 140/92 | HR 76 | Temp 99.4°F | Ht 69.0 in | Wt 190.0 lb

## 2018-10-14 DIAGNOSIS — I25119 Atherosclerotic heart disease of native coronary artery with unspecified angina pectoris: Secondary | ICD-10-CM

## 2018-10-14 DIAGNOSIS — E782 Mixed hyperlipidemia: Secondary | ICD-10-CM

## 2018-10-14 DIAGNOSIS — I10 Essential (primary) hypertension: Secondary | ICD-10-CM | POA: Diagnosis not present

## 2018-10-14 MED ORDER — ATORVASTATIN CALCIUM 10 MG PO TABS: 10.0000 mg | ORAL_TABLET | Freq: Every day | ORAL | 3 refills | Status: DC

## 2018-10-14 NOTE — Patient Instructions (Addendum)
Medication Instructions:    Your physician has recommended you make the following change in your medication:   Start atorvastatin 10 mg by mouth daily at bedtime  Continue all other medications the same  Labwork:  NONE  Testing/Procedures:  NONE  Follow-Up:  Your physician recommends that you schedule a follow-up appointment in: 6 months. You will receive a reminder letter in the mail in about 4 months reminding you to call and schedule your appointment. If you don't receive this letter, please contact our office.  Any Other Special Instructions Will Be Listed Below (If Applicable).  If you need a refill on your cardiac medications before your next appointment, please call your pharmacy.

## 2018-10-14 NOTE — Progress Notes (Signed)
Cardiology Office Note  Date: 10/14/2018   ID: Calvin Cochran, DOB 12/17/1940, MRN [id number]  PCP: [facility]. [facility]  Cardiologist:  Nona DellSamuel Athony Coppa, MD Electrophysiologist:  None   Chief Complaint  Patient presents with  . Coronary Artery Disease    History of Present Illness: Calvin Cochran is a 78 y.o. male last seen in October 2019.  He presents for a routine follow-up visit.  He tells that he has been doing well, no angina symptoms or shortness of breath over NYHA class II.  He has been staying busy with house chores and yard work during the pandemic.  He has also been dieting, cut out soft drinks and candy and is lost about 10 pounds.  I reviewed his medications.  He states that he was taken off Lipitor by his PCP, did not have any intolerances.  His last LDL was down to 72.  I talked with him about rationale for continuing at least low-dose statin therapy in light of ischemic heart disease.  He is otherwise on aspirin and has nitroglycerin available.  Past Medical History:  Diagnosis Date  . CAD (coronary artery disease)    DES to mid LAD October 2018  . Essential hypertension   . NSTEMI (non-ST elevated myocardial infarction) (HCC) 01/2017    Past Surgical History:  Procedure Laterality Date  . CORONARY STENT INTERVENTION N/A 02/01/2017   Procedure: CORONARY STENT INTERVENTION;  Surgeon: Runell GessBerry, Jonathan J, MD;  [facility];  Service: Cardiovascular;  Laterality: N/A;  LAD Prox-Mid lesion stented  . LEFT HEART CATH AND CORONARY ANGIOGRAPHY N/A 02/01/2017   Procedure: LEFT HEART CATH AND CORONARY ANGIOGRAPHY;  Surgeon: Runell GessBerry, Jonathan J, MD;  [facility]: [facility];  Service: Cardiovascular;  Laterality: N/A;    Current Outpatient Medications  Medication Sig Dispense Refill  . aspirin EC 81 MG tablet Take 81 mg by mouth daily as needed for mild pain.    . nitroGLYCERIN (NITROSTAT) 0.4 MG SL tablet Place 1 tablet (0.4 mg total) under the tongue  every 5 (five) minutes x 3 doses as needed for chest pain. 10 tablet 0  . atorvastatin (LIPITOR) 10 MG tablet Take 1 tablet (10 mg total) by mouth daily. 90 tablet 3   No current facility-administered medications for this visit.    Allergies:  Amoxicillin   Social History: The patient  reports that he has never smoked. He has never used smokeless tobacco. He reports current alcohol use. He reports that he does not use drugs.   ROS:  Please see the history of present illness. Otherwise, complete review of systems is positive for none.  All other systems are reviewed and negative.   Physical Exam: VS:  BP (!) 140/92   Pulse 76   Temp 99.4 F (37.4 C)   Ht 5\' 9"  (1.753 m)   Wt 190 lb (86.2 kg)   SpO2 98%   BMI 28.06 kg/m , BMI Body mass index is 28.06 kg/m.  Wt Readings from Last 3 Encounters:  10/14/18 190 lb (86.2 kg)  01/07/18 200 lb (90.7 kg)  05/19/17 199 lb (90.3 kg)    General: Elderly male, appears comfortable at rest. HEENT: Conjunctiva and lids normal, oropharynx clear with moist mucosa. Neck: Supple, no elevated JVP or carotid bruits, no thyromegaly. Lungs: Clear to auscultation, nonlabored breathing at rest. Cardiac: Regular rate and rhythm, no S3 or significant systolic murmur. Abdomen: Soft, nontender, bowel sounds present. Extremities: No pitting edema, distal pulses 2+.  Skin: Warm and dry. Musculoskeletal: No kyphosis. Neuropsychiatric: Alert and oriented x3, affect grossly appropriate.  ECG:  An ECG dated 01/07/2018 was personally reviewed today and demonstrated:  Marked sinus bradycardia with lead artifact.  Recent Labwork:  February 2019: AST 24, ALT 23, cholesterol 143, triglycerides 72, HDL 57, LDL 72  Other Studies Reviewed Today:  Cardiac catheterization and PCI 02/01/2017:  RPDA lesion, 50 %stenosed.  1st Mrg lesion, 30 %stenosed.  2nd Diag-2 lesion, 90 %stenosed.  2nd Diag-1 lesion, 80 %stenosed.  Prox LAD to Mid LAD lesion, 95 %stenosed.   Post intervention, there is a 0% residual stenosis.  A stent was successfully placed.  The left ventricular systolic function is normal.  LV end diastolic pressure is mildly elevated.  The left ventricular ejection fraction is 55-65% by visual estimate.  Assessment and Plan:  1.  CAD status post DES to the mid LAD in October 2018.  He is doing very well at this time with good stamina by report.  Continue aspirin with as needed nitroglycerin.  He was taken off Lopressor previously due to bradycardia.  I did recommend that he go back on low-dose statin therapy.  2.  Mixed hyperlipidemia, resuming Lipitor 10 mg daily.  3.  Essential hypertension, managed by diet at this time.  He has lost about 10 pounds.  Keep follow-up with PCP.  Could always consider addition of ARB.  Medication Adjustments/Labs and Tests Ordered: Current medicines are reviewed at length with the patient today.  Concerns regarding medicines are outlined above.   Tests Ordered: No orders of the defined types were placed in this encounter.   Medication Changes: Meds ordered this encounter  Medications  . atorvastatin (LIPITOR) 10 MG tablet    Sig: Take 1 tablet (10 mg total) by mouth daily.    Dispense:  90 tablet    Refill:  3    10/14/2018 NEW    Disposition:  Follow up 6 months in the [place] office.  Signed, Satira Sark, MD, [facility] 10/14/2018 3:54 PM    [facility] [place], [place], [place] [address] Phone: [phone]; Fax: [phone]

## 2018-10-31 IMAGING — DX DG CHEST 1V PORT
1 series · 1 of 1 positions shown · non-contrast
Comparison: None in PACs

CLINICAL DATA: Left-sided chest pain today associated with
shortness of breath. History of NSTEMI, hypertension common never
smoked.

EXAM:
PORTABLE CHEST 1 VIEW

[chest ap]
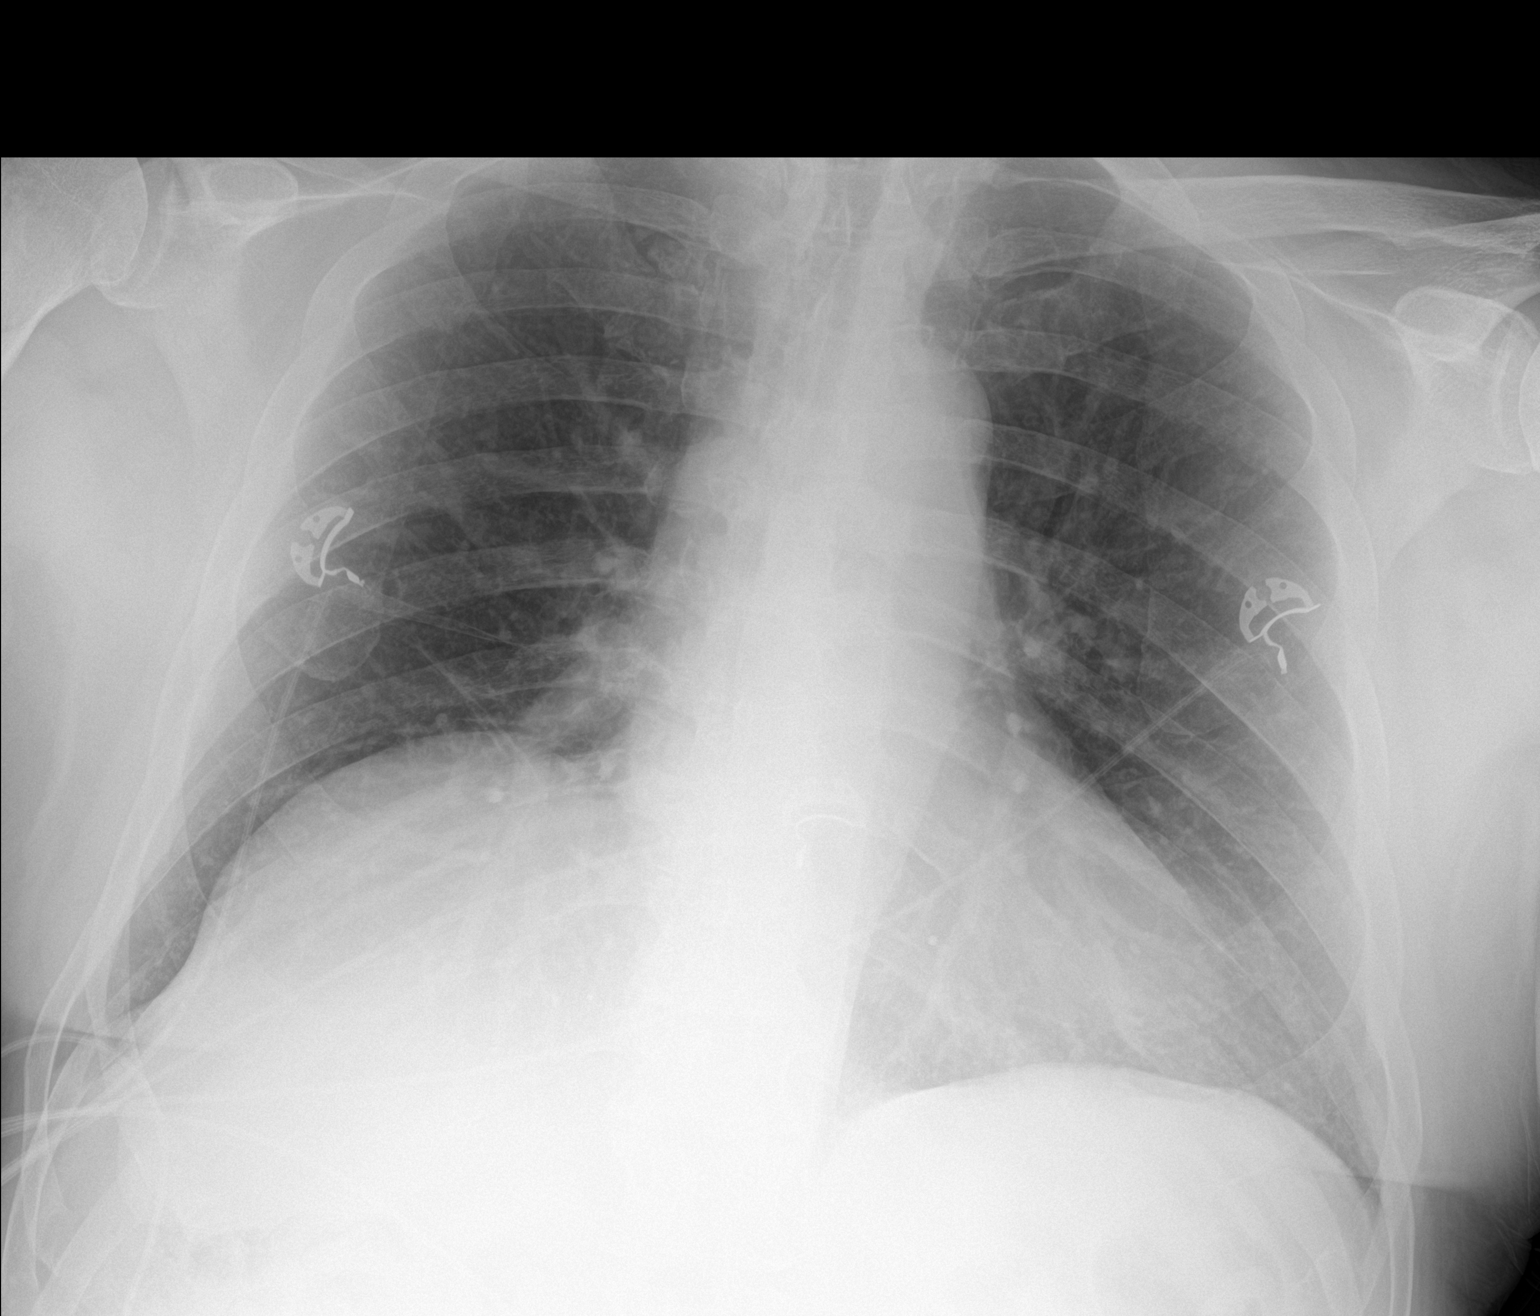

[1 of 1 positions shown; findings below may reference images not displayed]

FINDINGS: The left lung is well-expanded and clear. On the right there is
elevation of the hemidiaphragm. The aerated portion of the right
lung is clear. The heart and pulmonary vascularity are normal. The
mediastinum is normal in width. The bony thorax is unremarkable.
IMPRESSION: Elevated right hemidiaphragm. Otherwise no acute cardiopulmonary
abnormality.

## 2019-06-20 ENCOUNTER — Ambulatory Visit (INDEPENDENT_AMBULATORY_CARE_PROVIDER_SITE_OTHER): Payer: Medicare PPO | Admitting: Cardiology

## 2019-06-20 ENCOUNTER — Encounter: Payer: Self-pay | Admitting: Cardiology

## 2019-06-20 ENCOUNTER — Other Ambulatory Visit: Payer: Self-pay

## 2019-06-20 VITALS — BP 160/92 | HR 60 | Ht 69.0 in | Wt 195.0 lb

## 2019-06-20 DIAGNOSIS — I10 Essential (primary) hypertension: Secondary | ICD-10-CM | POA: Diagnosis not present

## 2019-06-20 DIAGNOSIS — I25119 Atherosclerotic heart disease of native coronary artery with unspecified angina pectoris: Secondary | ICD-10-CM | POA: Diagnosis not present

## 2019-06-20 DIAGNOSIS — E782 Mixed hyperlipidemia: Secondary | ICD-10-CM

## 2019-06-20 DIAGNOSIS — I779 Disorder of arteries and arterioles, unspecified: Secondary | ICD-10-CM

## 2019-06-20 MED ORDER — LOSARTAN POTASSIUM 25 MG PO TABS: 25.0000 mg | ORAL_TABLET | Freq: Every day | ORAL | 3 refills | Status: DC

## 2019-06-20 NOTE — Progress Notes (Signed)
Cardiology Office Note  Date: 06/20/2019   ID: Calvin Cochran, DOB Apr 29, 1940, MRN [id number]  PCP:  System, Pcp Not In  Cardiologist:  Rozann Lesches, MD Electrophysiologist:  None   Chief Complaint  Patient presents with  . Cardiac follow-up    History of Present Illness: Calvin Cochran is a 79 y.o. male last seen in July 2020.  He presents for a routine visit.  He denies any significant angina symptoms or nitroglycerin use.  He has been walking for exercise when the weather allows.  Reports no palpitations or syncope.  I reviewed his medications.  He continues on aspirin and Lipitor.  He has not had a recent lipid panel.  Blood pressure is elevated today, we discussed antihypertensive therapy for further risk reduction.  He also asked about screening for carotid artery disease.  I personally reviewed his ECG today which shows sinus bradycardia with nonspecific ST changes.  He is asymptomatic in terms of bradycardia, we did stop beta-blocker therapy previously.   Past Medical History:  Diagnosis Date  . CAD (coronary artery disease)    DES to mid LAD October 2018  . Essential hypertension   . NSTEMI (non-ST elevated myocardial infarction) ([place]) 01/2017    Past Surgical History:  Procedure Laterality Date  . CORONARY STENT INTERVENTION N/A 02/01/2017   Procedure: CORONARY STENT INTERVENTION;  Surgeon: Lorretta Harp, MD;  Location: [facility];  Service: Cardiovascular;  Laterality: N/A;  LAD Prox-Mid lesion stented  . LEFT HEART CATH AND CORONARY ANGIOGRAPHY N/A 02/01/2017   Procedure: LEFT HEART CATH AND CORONARY ANGIOGRAPHY;  Surgeon: Lorretta Harp, MD;  Location: [facility];  Service: Cardiovascular;  Laterality: N/A;    Current Outpatient Medications  Medication Sig Dispense Refill  . aspirin EC 81 MG tablet Take 81 mg by mouth daily as needed for mild pain.    [name] atorvastatin (LIPITOR) 10 MG tablet Take 1 tablet (10 mg total) by mouth daily. 90  tablet 3  . nitroGLYCERIN (NITROSTAT) 0.4 MG SL tablet Place 1 tablet (0.4 mg total) under the tongue every 5 (five) minutes x 3 doses as needed for chest pain. 10 tablet 0  . losartan (COZAAR) 25 MG tablet Take 1 tablet (25 mg total) by mouth daily. 90 tablet 3   No current facility-administered medications for this visit.   Allergies:  Amoxicillin   ROS:   No palpitations or syncope.  Physical Exam: VS:  BP (!) 160/92   Pulse 60   Ht 5\' 9"  (1.753 m)   Wt 195 lb (88.5 kg)   SpO2 98%   BMI 28.80 kg/m , BMI Body mass index is 28.8 kg/m.  Wt Readings from Last 3 Encounters:  06/20/19 195 lb (88.5 kg)  10/14/18 190 lb (86.2 kg)  01/07/18 200 lb (90.7 kg)    General: Elderly male, appears comfortable at rest. HEENT: Conjunctiva and lids normal, wearing a mask. Neck: Supple, no elevated JVP or carotid bruits, no thyromegaly. Lungs: Clear to auscultation, nonlabored breathing at rest. Cardiac: Regular rate and rhythm, no S3 or significant systolic murmur. Abdomen: Soft, nontender, bowel sounds present. Extremities: No pitting edema, distal pulses 2+. Skin: Warm and dry. Musculoskeletal: No kyphosis. Neuropsychiatric: Alert and oriented x3, affect grossly appropriate.  ECG:  An ECG dated 01/07/2018 was personally reviewed today and demonstrated:  Marked sinus bradycardia with lead artifact.  Recent Labwork:  February 2019: AST 24, ALT 23, cholesterol 143, triglycerides 72, HDL 57, LDL 72  Other  Studies Reviewed Today:  Cardiac catheterization and PCI 02/01/2017:  RPDA lesion, 50 %stenosed.  1st Mrg lesion, 30 %stenosed.  2nd Diag-2 lesion, 90 %stenosed.  2nd Diag-1 lesion, 80 %stenosed.  Prox LAD to Mid LAD lesion, 95 %stenosed.  Post intervention, there is a 0% residual stenosis.  A stent was successfully placed.  The left ventricular systolic function is normal.  LV end diastolic pressure is mildly elevated.  The left ventricular ejection fraction is 55-65%  by visual estimate.  Assessment and Plan:  1.  CAD status post DES to the mid LAD in October 2018.  He reports no angina symptoms or nitroglycerin use and we continue with observation.  I reviewed his ECG.  Continue aspirin and Lipitor.  Also adding ARB for better control of blood pressure.  2.  Essential hypertension, start Cozaar 25 mg daily.  Follow-up BMET in 10 days.  3.  Screening for carotid artery disease, carotid Dopplers will be obtained.  4.  Mixed hyperlipidemia, on Lipitor 10 mg daily.  Follow-up FLP with above lab work.  Medication Adjustments/Labs and Tests Ordered: Current medicines are reviewed at length with the patient today.  Concerns regarding medicines are outlined above.   Tests Ordered: Orders Placed This Encounter  Procedures  . Basic metabolic panel  . Lipid panel  . EKG 12-Lead  . VAS US CAROTID    Medication Changes: Meds ordered this encounter  Medications  . losartan (COZAAR) 25 MG tablet    Sig: Take 1 tablet (25 mg total) by mouth daily.    Dispense:  90 tablet    Refill:  3    Disposition:  Follow up 6 months in the [place] office.  Signed, Jonelle Sidle, MD, [facility] 06/20/2019 2:18 PM    [facility] at [facility] [address] [place], [place], [place] [address] Phone: [phone]; Fax: [phone]

## 2019-06-20 NOTE — Patient Instructions (Addendum)
Medication Instructions:    Your physician has recommended you make the following change in your medication:   Start losartan 25 mg by mouth daily  Continue other medications the same  Labwork: Your physician recommends that you return for a FASTING lipid profile & BMET in 2 weeks. Please do not eat or drink for at least 8 hours when you have this done. You may take your medications that morning with a sip of water. You may have this done at your lab of choice ([facility]). You will need to co-ordinate this.  Testing/Procedures: Your physician has requested that you have a carotid duplex. This test is an ultrasound of the carotid arteries in your neck. It looks at blood flow through these arteries that supply the brain with blood. Allow one hour for this exam. There are no restrictions or special instructions.  Follow-Up:  Your physician recommends that you schedule a follow-up appointment in: 6 months (office). You will receive a reminder letter in the mail in about 4 months reminding you to call and schedule your appointment. If you don't receive this letter, please contact our office.  Any Other Special Instructions Will Be Listed Below (If Applicable).  If you need a refill on your cardiac medications before your next appointment, please call your pharmacy.

## 2019-06-22 ENCOUNTER — Telehealth: Payer: Self-pay | Admitting: *Deleted

## 2019-06-22 MED ORDER — ATORVASTATIN CALCIUM 20 MG PO TABS: 20.0000 mg | ORAL_TABLET | Freq: Every day | ORAL | 3 refills | Status: DC

## 2019-06-22 NOTE — Telephone Encounter (Signed)
-----   Message from Jonelle Sidle, MD sent at 06/21/2019 11:53 AM EDT ----- Results reviewed.  Renal function and potassium normal.  Total cholesterol is 179 but LDL is 100 (ideally 70 or less with CAD).  Please have him increase Lipitor to 20 mg daily.

## 2019-06-22 NOTE — Telephone Encounter (Signed)
Patient informed and verbalized understanding. Copy sent to PCP 

## 2019-06-27 ENCOUNTER — Other Ambulatory Visit: Payer: Self-pay

## 2019-06-27 ENCOUNTER — Ambulatory Visit (INDEPENDENT_AMBULATORY_CARE_PROVIDER_SITE_OTHER): Payer: Medicare PPO

## 2019-06-27 DIAGNOSIS — I779 Disorder of arteries and arterioles, unspecified: Secondary | ICD-10-CM | POA: Diagnosis not present

## 2019-06-28 ENCOUNTER — Telehealth: Payer: Self-pay | Admitting: *Deleted

## 2019-06-28 NOTE — Telephone Encounter (Signed)
Patient informed. Copy sent to PCP °

## 2019-06-28 NOTE — Telephone Encounter (Signed)
-----   Message from [facility] Sidle, MD sent at 06/27/2019 12:57 PM EDT ----- Results reviewed.  Please let him know that there is only mild plaque in the carotid arteries.  No further testing indicated at this time, he is already on appropriate medical therapy including aspirin and Lipitor.

## 2019-12-22 ENCOUNTER — Ambulatory Visit: Payer: Medicare PPO | Admitting: Cardiology

## 2019-12-22 ENCOUNTER — Encounter: Payer: Self-pay | Admitting: Cardiology

## 2019-12-22 VITALS — BP 168/98 | HR 73 | Ht 69.0 in | Wt 193.0 lb

## 2019-12-22 DIAGNOSIS — I10 Essential (primary) hypertension: Secondary | ICD-10-CM | POA: Diagnosis not present

## 2019-12-22 DIAGNOSIS — E782 Mixed hyperlipidemia: Secondary | ICD-10-CM

## 2019-12-22 DIAGNOSIS — I25119 Atherosclerotic heart disease of native coronary artery with unspecified angina pectoris: Secondary | ICD-10-CM

## 2019-12-22 MED ORDER — LOSARTAN POTASSIUM 50 MG PO TABS: 50.0000 mg | ORAL_TABLET | Freq: Every day | ORAL | 2 refills | Status: DC

## 2019-12-22 NOTE — Progress Notes (Signed)
Cardiology Office Note  Date: 12/22/2019   ID: Calvin Cochran, DOB 1940-07-24, MRN [id number]  PCP:  Pcp, No  Cardiologist:  Nona Dell, MD Electrophysiologist:  None   Chief Complaint  Patient presents with  . Cardiac follow-up    History of Present Illness: Calvin Cochran is a 79 y.o. male last seen in March.  He presents for a routine visit.  Overall, reports doing well from a cardiac perspective, no angina symptoms or nitroglycerin use.  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  He has been doing that for the last 8 years, but was off during Covid.  Lipitor was increased to 20 mg daily after last lipid panel in March.  He reports no intolerances we will plan on a follow-up lipid profile.  He also tells me that his blood pressure has been elevated when he checks it at home, consistent with measurement today.  Past Medical History:  Diagnosis Date  . CAD (coronary artery disease)    DES to mid LAD October 2018  . Essential hypertension   . NSTEMI (non-ST elevated myocardial infarction) (HCC) 01/2017    Past Surgical History:  Procedure Laterality Date  . CORONARY STENT INTERVENTION N/A 02/01/2017   Procedure: CORONARY STENT INTERVENTION;  Surgeon: Runell Gess, MD;  [facility];  Service: Cardiovascular;  Laterality: N/A;  LAD Prox-Mid lesion stented  . LEFT HEART CATH AND CORONARY ANGIOGRAPHY N/A 02/01/2017   Procedure: LEFT HEART CATH AND CORONARY ANGIOGRAPHY;  Surgeon: Runell Gess, MD;  [facility]: [facility];  Service: Cardiovascular;  Laterality: N/A;    Current Outpatient Medications  Medication Sig Dispense Refill  . aspirin EC 81 MG tablet Take 81 mg by mouth daily as needed for mild pain.    [name] atorvastatin (LIPITOR) 20 MG tablet Take 1 tablet (20 mg total) by mouth daily. 90 tablet 3  . nitroGLYCERIN (NITROSTAT) 0.4 MG SL tablet Place 1 tablet (0.4 mg total) under the  tongue every 5 (five) minutes x 3 doses as needed for chest pain. 10 tablet 0  . losartan (COZAAR) 50 MG tablet Take 1 tablet (50 mg total) by mouth daily. 90 tablet 2   No current facility-administered medications for this visit.   Allergies:  Amoxicillin   ROS:   No palpitations or syncope.  Physical Exam: VS:  BP (!) 168/98   Pulse 73   Ht 5\' 9"  (1.753 m)   Wt 193 lb (87.5 kg)   SpO2 98%   BMI 28.50 kg/m , BMI Body mass index is 28.5 kg/m.  Wt Readings from Last 3 Encounters:  12/22/19 193 lb (87.5 kg)  06/20/19 195 lb (88.5 kg)  10/14/18 190 lb (86.2 kg)    General: Patient appears comfortable at rest. HEENT: Conjunctiva and lids normal, wearing a mask. Neck: Supple, no elevated JVP or carotid bruits, no thyromegaly. Lungs: Clear to auscultation, nonlabored breathing at rest. Cardiac: Regular rate and rhythm, no S3 or significant systolic murmur, no pericardial rub. Extremities: No pitting edema, distal pulses 2+.  ECG:  An ECG dated 06/20/2019 was personally reviewed today and demonstrated:  Sinus bradycardia with nonspecific ST changes.  Recent Labwork:  March 2021: Cholesterol 179, HDL 53, triglycerides 162, LDL 100, BUN 14, creatinine 0.96, potassium 3.9  Other Studies Reviewed Today:  Cardiac catheterization and PCI 02/01/2017:  RPDA lesion, 50 %stenosed.  1st Mrg lesion, 30 %stenosed.  2nd Diag-2  lesion, 90 %stenosed.  2nd Diag-1 lesion, 80 %stenosed.  Prox LAD to Mid LAD lesion, 95 %stenosed.  Post intervention, there is a 0% residual stenosis.  A stent was successfully placed.  The left ventricular systolic function is normal.  LV end diastolic pressure is mildly elevated.  The left ventricular ejection fraction is 55-65% by visual estimate.  Carotid Dopplers 06/27/2019: Summary:  Right Carotid: Velocities in the right ICA are consistent with a 1-39%  stenosis.   Left Carotid: Velocities in the left ICA are consistent with a 1-39%    stenosis.   Vertebrals: Bilateral vertebral arteries demonstrate antegrade flow.  Subclavians: Normal flow hemodynamics were seen in bilateral subclavian        arteries.   Assessment and Plan:  1.  CAD status post DES to the mid LAD in October 2018.  He reports no angina on medical therapy.  Continue aspirin, losartan, and Lipitor.  2.  Essential hypertension, increase losartan to 50 mg daily.  Follow-up BMET in 7 to 10 days.  3.  Mixed hyperlipidemia, continue Lipitor 20 mg daily with follow-up FLP.  Medication Adjustments/Labs and Tests Ordered: Current medicines are reviewed at length with the patient today.  Concerns regarding medicines are outlined above.   Tests Ordered: Orders Placed This Encounter  Procedures  . Lipid panel  . Basic metabolic panel    Medication Changes: Meds ordered this encounter  Medications  . losartan (COZAAR) 50 MG tablet    Sig: Take 1 tablet (50 mg total) by mouth daily.    Dispense:  90 tablet    Refill:  2    12/22/2019 dose increase    Disposition:  Follow up 6 months in the [place] office.  Signed, Jonelle Sidle, MD, [facility] 12/22/2019 3:25 PM    [facility] at [facility] [address] [place], [place], [place] [address] Phone: [phone]; Fax: [phone]

## 2019-12-22 NOTE — Patient Instructions (Signed)
Medication Instructions:   Your physician has recommended you make the following change in your medication:   Increase losartan to 50 mg daily.   Continue other medications the same  Labwork: Your physician recommends that you return for a FASTING lipid profile & BMET in 7-10 days. Please do not eat or drink for at least 8 hours when you have this done. You may take your medications that morning with a sip of water.  This may be done at [facility] or [employer] or [employer] ([address] [place]) Monday-Friday from 8:00 am - 4:00 pm. No appointment is needed.  Testing/Procedures:  None  Follow-Up:  Your physician recommends that you schedule a follow-up appointment in: 6 months.  Any Other Special Instructions Will Be Listed Below (If Applicable).  If you need a refill on your cardiac medications before your next appointment, please call your pharmacy.

## 2020-01-09 ENCOUNTER — Telehealth: Payer: Self-pay | Admitting: *Deleted

## 2020-01-09 DIAGNOSIS — E782 Mixed hyperlipidemia: Secondary | ICD-10-CM

## 2020-01-09 MED ORDER — ATORVASTATIN CALCIUM 40 MG PO TABS: 40.0000 mg | ORAL_TABLET | Freq: Every day | ORAL | 3 refills | Status: DC

## 2020-01-09 NOTE — Telephone Encounter (Signed)
-----   Message from [facility] Sidle, MD sent at 01/04/2020 10:39 AM EDT ----- Results reviewed.  Renal function and potassium are stable after recent increase in losartan.  LDL cholesterol 95.  I would suggest that we increase Lipitor to 40 mg daily to try to get this more optimally managed and closer to 70.  Recheck FLP for next visit.

## 2020-01-09 NOTE — Telephone Encounter (Signed)
Patient informed and verbalized understanding of plan. Lab order faxed to [facility] °

## 2020-06-26 ENCOUNTER — Encounter: Payer: Self-pay | Admitting: Cardiology

## 2020-06-26 ENCOUNTER — Other Ambulatory Visit: Payer: Self-pay | Admitting: Cardiology

## 2020-06-26 ENCOUNTER — Ambulatory Visit (INDEPENDENT_AMBULATORY_CARE_PROVIDER_SITE_OTHER): Payer: Medicare HMO

## 2020-06-26 ENCOUNTER — Ambulatory Visit: Payer: Medicare PPO | Admitting: Cardiology

## 2020-06-26 VITALS — BP 202/100 | HR 70 | Wt 194.8 lb

## 2020-06-26 DIAGNOSIS — I10 Essential (primary) hypertension: Secondary | ICD-10-CM | POA: Diagnosis not present

## 2020-06-26 DIAGNOSIS — E782 Mixed hyperlipidemia: Secondary | ICD-10-CM

## 2020-06-26 DIAGNOSIS — I25119 Atherosclerotic heart disease of native coronary artery with unspecified angina pectoris: Secondary | ICD-10-CM | POA: Diagnosis not present

## 2020-06-26 DIAGNOSIS — R0602 Shortness of breath: Secondary | ICD-10-CM | POA: Diagnosis not present

## 2020-06-26 DIAGNOSIS — I4891 Unspecified atrial fibrillation: Secondary | ICD-10-CM

## 2020-06-26 DIAGNOSIS — Z8679 Personal history of other diseases of the circulatory system: Secondary | ICD-10-CM

## 2020-06-26 MED ORDER — AMLODIPINE BESYLATE 5 MG PO TABS: 5.0000 mg | ORAL_TABLET | Freq: Every day | ORAL | 1 refills | Status: DC

## 2020-06-26 NOTE — Patient Instructions (Addendum)
Medication Instructions:    Your physician has recommended you make the following change in your medication:   Start amlodipine 5 mg by mouth daily  Continue other medications the same  Labwork:  none  Testing/Procedures: Your physician has requested that you have an echocardiogram. Echocardiography is a painless test that uses sound waves to create images of your heart. It provides your doctor with information about the size and shape of your heart and how well your heart's chambers and valves are working. This procedure takes approximately one hour. There are no restrictions for this procedure. ZIO- Long Term Monitor Instructions   Your physician has requested you wear your ZIO patch monitor 14 days.   This is a single patch monitor.  [employer] supplies one patch monitor per enrollment.  Additional stickers are not available.   Please do not apply patch if you will be having a Nuclear Stress Test, Echocardiogram, Cardiac CT, MRI, or Chest Xray during the time frame you would be wearing the monitor. The patch cannot be worn during these tests.  You cannot remove and re-apply the ZIO XT patch monitor.     Once you have received you monitor, please review enclosed instructions.  Your monitor has already been registered assigning a specific monitor serial # to you.   Applying the monitor   Shave hair from upper left chest.   Hold abrader disc by orange tab.  Rub abrader in 40 strokes over left upper chest as indicated in your monitor instructions.   Clean area with 4 enclosed alcohol pads .  Use all pads to assure are is cleaned thoroughly.  Let dry.   Apply patch as indicated in monitor instructions.  Patch will be place under collarbone on left side of chest with arrow pointing upward.   Rub patch adhesive wings for 2 minutes.Remove white label marked "1".  Remove white label marked "2".  Rub patch adhesive wings for 2 additional minutes.   While looking in a mirror, press and  release button in center of patch.  A small green light will flash 3-4 times .  This will be your only indicator the monitor has been turned on.     Do not shower for the first 24 hours.  You may shower after the first 24 hours.   Press button if you feel a symptom. You will hear a small click.  Record Date, Time and Symptom in the Patient Log Book.   When you are ready to remove patch, follow instructions on last 2 pages of Patient Log Book.  Stick patch monitor onto last page of Patient Log Book.   Place Patient Log Book in [place] box.  Use locking tab on box and tape box closed securely.  The [employer] and [employer] has [employer] on it.  Please place in mailbox as soon as possible.  Your physician should have your test results approximately 7 days after the monitor has been mailed back to [facility]   Call [facility] Customer Care at [phone] if you have questions regarding your ZIO XT patch monitor.  Call them immediately if you see an orange light blinking on your monitor.    If your monitor falls off in less than 4 days contact our Monitor department at [phone].  If your monitor becomes loose or falls off after 4 days call [employer] at [phone] for suggestions on securing your monitor.  Follow-Up:  Your physician recommends that you schedule a follow-up appointment in: 1 month  Any Other Special Instructions Will Be Listed Below (If Applicable).  If you need a refill on your cardiac medications before your next appointment, please call your pharmacy.

## 2020-06-26 NOTE — Progress Notes (Signed)
Cardiology Office Note  Date: 06/26/2020   ID: Calvin Cochran, DOB 01-Oct-1940, MRN [id number]  PCP:  Calvin Cochran  Cardiologist:  Calvin Dell, MD Electrophysiologist:  None   Chief Complaint  Patient presents with  . Cardiac follow-up    History of Present Illness: Calvin Cochran is a 80 y.o. male last seen Cochran September 2021.  He presents for a follow-up visit.  He was recently hospitalized [place]ran mid March having presented with shortness of breath and hypertensive urgency.  There is mention Cochran the provided information of possible paroxysmal atrial fibrillation/flutter, although I do not see this formally documented by any tracings or telemetry strips.  He recalls being told that he had pneumonia, was treated with antibiotics.  Also told to increase his Cozaar to 50 mg twice daily.  No additional inpatient cardiac testing was arranged.  Lab work from hospital stay included hemoglobin 12.3, platelets 180, potassium 4.0, magnesium 1.7, high-sensitivity troponin I levels of 33 and 34, lactic acid 1.4, SARS coronavirus 2 test negative, influenza negative, BNP 157, TSH 1.29, CRP 0.8.  He states that he is feeling better, still with a mild sense of shortness of breath, no chest pain or palpitations.  I personally reviewed his ECG today which shows normal sinus rhythm.  Blood pressure is significantly elevated, he states that it has been high at home despite the adjustment and Cozaar.  He tells me that he was [place] January hunting with friends, ultimately found out that he was exposed to COVID-19, thinks he may have had it but never formally got tested at that time.  He does not have any prior history of cardiac arrhythmias.  LDL was 95 at last check Cochran September 2021, Lipitor increased to 40 mg daily.  Past Medical History:  Diagnosis Date  . CAD (coronary artery disease)    DES to mid LAD October 2018  . Essential hypertension   . NSTEMI (non-ST  elevated myocardial infarction) (HCC) 01/2017    Past Surgical History:  Procedure Laterality Date  . CORONARY STENT INTERVENTION N/A 02/01/2017   Procedure: CORONARY STENT INTERVENTION;  Surgeon: Calvin Gess, MD;  [facility];  Service: Cardiovascular;  Laterality: N/A;  LAD Prox-Mid lesion stented  . LEFT HEART CATH AND CORONARY ANGIOGRAPHY N/A 02/01/2017   Procedure: LEFT HEART CATH AND CORONARY ANGIOGRAPHY;  Surgeon: Calvin Gess, MD;  [facility]: [facility];  Service: Cardiovascular;  Laterality: N/A;    Current Outpatient Medications  Medication Sig Dispense Refill  . amLODipine (NORVASC) 5 MG tablet Take 1 tablet (5 mg total) by mouth daily. 90 tablet 1  . aspirin EC 81 MG tablet Take 81 mg by mouth daily as needed for mild pain.    [name] atorvastatin (LIPITOR) 40 MG tablet Take 1 tablet (40 mg total) by mouth daily. 90 tablet 3  . losartan (COZAAR) 50 MG tablet Take 50 mg by mouth 2 (two) times daily.    . nitroGLYCERIN (NITROSTAT) 0.4 MG SL tablet Place 1 tablet (0.4 mg total) under the tongue every 5 (five) minutes x 3 doses as needed for chest pain. 10 tablet 0   No current facility-administered medications for this visit.   Allergies:  Amoxicillin   Social History: The patient  reports that he has never smoked. He has never used smokeless tobacco. He reports current alcohol use. He reports that he does not use drugs.   Family History: [name]  family history includes Lung cancer Cochran his father.   ROS: No orthopnea or PND.  Physical Exam: VS:  BP (!) 202/100 (BP Location: Left Arm, Patient Position: Sitting, Cuff Size: Normal)   Pulse 70   Wt 194 lb 12.8 oz (88.4 kg)   SpO2 97%   BMI 28.77 kg/m , BMI Body mass index is 28.77 kg/m.  Wt Readings from Last 3 Encounters:  06/26/20 194 lb 12.8 oz (88.4 kg)  12/22/19 193 lb (87.5 kg)  06/20/19 195 lb (88.5 kg)    General: Patient appears comfortable at rest. HEENT: Conjunctiva and  lids normal, wearing a mask. Neck: Supple, no elevated JVP or carotid bruits, no thyromegaly. Lungs: Coarse but clear breath sounds, nonlabored breathing at rest. Cardiac: Regular rate and rhythm, no S3 or significant systolic murmur, no pericardial rub. Abdomen: Soft, nontender, bowel sounds present. Extremities: No pitting edema, distal pulses 2+. Skin: Warm and dry. Musculoskeletal: No kyphosis. Neuropsychiatric: Alert and oriented x3, affect grossly appropriate.  ECG:  An ECG dated 06/19/2020 was personally reviewed today and demonstrated:  Sinus bradycardia with nonspecific ST changes.  Recent Labwork:  September 2021: Potassium 4.3, BUN 14, creatinine 1.07, cholesterol 167, triglycerides 54, HDL 61, LDL 95  Other Studies Reviewed Today:  Cardiac catheterization and PCI 02/01/2017:  RPDA lesion, 50 %stenosed.  1st Mrg lesion, 30 %stenosed.  2nd Diag-2 lesion, 90 %stenosed.  2nd Diag-1 lesion, 80 %stenosed.  Prox LAD to Mid LAD lesion, 95 %stenosed.  Post intervention, there is a 0% residual stenosis.  A stent was successfully placed.  The left ventricular systolic function is normal.  LV end diastolic pressure is mildly elevated.  The left ventricular ejection fraction is 55-65% by visual estimate.  Carotid Dopplers 06/27/2019: Summary:  Right Carotid: Velocities Cochran the right ICA are consistent with a 1-39%  stenosis.   Left Carotid: Velocities Cochran the left ICA are consistent with a 1-39%  stenosis.   Vertebrals: Bilateral vertebral arteries demonstrate antegrade flow.  Subclavians: Normal flow hemodynamics were seen Cochran bilateral subclavian        arteries.   Assessment and Plan:  1.  Recent hospitalization with shortness of breath and hypertensive urgency, no clear evidence of ACS based on high-sensitivity troponin I levels.  Patient managed [place], treated for possible pneumonia.  There is mention of PAF/flutter although I do not see  anything documented and he is Cochran sinus rhythm today.  He has no prior history of atrial arrhythmia.  Possible COVID-19 exposure Co[place]ran January, never tested at that time but was symptomatic.  Recent testing was negative.  He remains hypertensive at this point.  Continue losartan 50 mg twice daily, add Norvasc 5 mg daily.  Check echocardiogram to assess LVEF.  Also 14-day Zio patch to screen for potential arrhythmia.  We will bring him back for follow-up thereafter.  2.  CAD status post DES to the mid LAD Co[place] October 2018.  He does not report any active angina.  Continue aspirin, losartan, Lipitor, and as needed nitroglycerin.  Not on beta-blocker with tendency to run slow heart rate.  3.  Mixed hyperlipidemia, tolerating Lipitor 40 mg daily.  We will plan to reassess lipids once above work-up is complete.  4.  Essential hypertension, blood pressure poorly controlled despite medication adjustments.  Continue titration, adding Norvasc 5 mg daily.  Medication Adjustments/Labs and Tests Ordered: Current medicines are reviewed at length with the patient today.  Concerns regarding medicines are outlined above.   Tests Ordered: Orders  Placed This Encounter  Procedures  . ECHOCARDIOGRAM COMPLETE    Medication Changes: Meds ordered this encounter  Medications  . amLODipine (NORVASC) 5 MG tablet    Sig: Take 1 tablet (5 mg total) by mouth daily.    Dispense:  90 tablet    Refill:  1    06/26/2020 NEW    Disposition:  Follow up 1 month [place] the [place] office.  Signed, Jonelle Sidle, MD, [facility] 06/26/2020 4:03 PM    [facility] at [facility] [address] [place], [place], [place] [address] Phone: [phone]; Fax: [phone]

## 2020-06-27 NOTE — Addendum Note (Signed)
Addended by: Eustace Moore on: 06/27/2020 07:14 AM   Modules accepted: Orders

## 2020-07-10 DIAGNOSIS — I4891 Unspecified atrial fibrillation: Secondary | ICD-10-CM | POA: Diagnosis not present

## 2020-07-16 ENCOUNTER — Ambulatory Visit (INDEPENDENT_AMBULATORY_CARE_PROVIDER_SITE_OTHER): Payer: Medicare HMO

## 2020-07-16 DIAGNOSIS — R0602 Shortness of breath: Secondary | ICD-10-CM

## 2020-07-16 LAB — ECHOCARDIOGRAM COMPLETE
AR max vel: 1.41 cm2
AV Area VTI: 1.48 cm2
AV Area mean vel: 1.61 cm2
AV Mean grad: 5.6 mmHg
AV Peak grad: 12.8 mmHg
AV Vena cont: 0.34 cm
Ao pk vel: 1.79 m/s
Area-P 1/2: 4.06 cm2
Calc EF: 58.2 %
MV M vel: 2.52 m/s
MV Peak grad: 25.5 mmHg
P 1/2 time: 1261 msec
S' Lateral: 2.99 cm
Single Plane A2C EF: 59.4 %
Single Plane A4C EF: 57.1 %

## 2020-07-18 ENCOUNTER — Telehealth: Payer: Self-pay | Admitting: *Deleted

## 2020-07-18 NOTE — Telephone Encounter (Signed)
-----   Message from Jonelle Sidle, MD sent at 07/16/2020  4:51 PM EDT ----- Results reviewed. LVEF is normal at 60-65% (main question was to exclude cardiomyopathy). Mild to moderate aortic regurgitation not clearly symptom-provoking at this point. Keep scheduled follow-up.

## 2020-07-24 ENCOUNTER — Encounter: Payer: Self-pay | Admitting: *Deleted

## 2020-07-24 NOTE — Telephone Encounter (Signed)
-----   Message from Jonelle Sidle, MD sent at 07/21/2020  9:19 AM EDT ----- Results reviewed.  Please let him know that the cardiac monitor did demonstrate episodes of atrial flutter and atrial fibrillation.  Whether or not he is feeling this is a different question, but he does have an increased risk of stroke either way.  CHA2DS2-VASc score is 3.  Also, having several pauses, some of which occurred with transition from atrial arrhythmia to sinus rhythm.  Please get CBC and BMET, I would recommend switching from aspirin to Eliquis for stroke prophylaxis after review of lab work.  We also need to get him seen by EP for discussion of management of tachycardia-bradycardia syndrome.

## 2020-07-24 NOTE — Telephone Encounter (Signed)
Patient informed and verbalized understanding of plan. Reports having recent lab work with urologist ([place]her[place])-labs requested Request to discuss medication switch to eliquis and EP referral at upcoming visit on 07/29/20

## 2020-07-28 NOTE — Progress Notes (Signed)
Cardiology Office Note  Date: 07/29/2020   ID: Calvin Cochran, DOB [birth year], MRN [id number]  PCP:  System, Provider Not In  Cardiologist:  Nona Dell, MD Electrophysiologist:  None   Chief Complaint: Follow up SOB  History of Present Illness: Calvin Cochran is a 80 y.o. male with a history of SOB, CAD / NSTEMI. HTN.  Previous DES to mid LAD October 2018.   Last seen by Dr Diona Browner 06/26/2020. He had recently been hospitalized in mid March with SOB and hypertensive urgency, possible atrial fibrillation/flutter. He was treated for pneumonia. His Cozaar was increased to 50 mg po daily. At visit with Dr Diona Browner he was feeling better with mild sense of SOB. Denied chest pain or palpitations. EKG demonstrated NSR. BP was elevated at home after Cozaar had been increased. LDL in September 2021 was 95. Lipitor was increased to 40 mg. Due to continued elevation of BP, Norvasc 5 mg was added daily. He reported no active angina. To continue Losartan, Lipitor, NTG prn. Not on BB due to tendency to run a slow heart rate. Echocardiogram was ordered to assess LVEF. 14 day ZIO  monitor was ordered to screen for potential arrhythmia.  Continue to titrate BP medication. Plan to order follow up lipid panel once work-up complete.  Cardiac monitor showed episodes af atrial fibrillation and atrial flutter with pauses. CHA2DS2-VASC = 3 . Plans were to get CBC and BMET. He was to start Eliquis and stop ASA. He was referred to EP for tachycardia-bradycardia. Echocardiogram EF 60-65%, No WMA's. Mild LVH. Trivial MR, Mild to Moderate AR.  He is here for follow-up today.  He denies any issues.  We went over the results of recent cardiac monitor and implications of atrial fibrillation/tachycardia-bradycardia syndrome.  We spoke of transitioning from aspirin to Eliquis.  He understands the rationale behind the change.  He is willing to start Eliquis.  Discussed the fact that Dr. Diona Browner wanted him to be referred to  EP for tachycardia-bradycardia syndrome / atrial fibrillation. We discussed echocardiogram results.  He denies any syncopal or near syncopal episodes.  Denies any orthostatic symptoms.  Denies any lightheadedness, dizziness.  He has not felt any palpitations.  He denies any CVA or TIA-like symptoms, PND, orthopnea, bleeding.  Denies any claudication-like symptoms, DVT or PE-like symptoms.  Blood pressure remains elevated at 148/84 but much better since starting Norvasc 5 mg.   Past Medical History:  Diagnosis Date  . CAD (coronary artery disease)    DES to mid LAD October 2018  . Essential hypertension   . NSTEMI (non-ST elevated myocardial infarction) (HCC) 01/2017    Past Surgical History:  Procedure Laterality Date  . CORONARY STENT INTERVENTION N/A 02/01/2017   Procedure: CORONARY STENT INTERVENTION;  Surgeon: Runell Gess, MD;  [facility];  Service: Cardiovascular;  Laterality: N/A;  LAD Prox-Mid lesion stented  . LEFT HEART CATH AND CORONARY ANGIOGRAPHY N/A 02/01/2017   Procedure: LEFT HEART CATH AND CORONARY ANGIOGRAPHY;  Surgeon: Runell Gess, MD;  [facility];  Service: Cardiovascular;  Laterality: N/A;    Current Outpatient Medications  Medication Sig Dispense Refill  . amLODipine (NORVASC) 5 MG tablet Take 1 tablet (5 mg total) by mouth daily. 90 tablet 1  . aspirin EC 81 MG tablet Take 81 mg by mouth daily as needed for mild pain.    [name] atorvastatin (LIPITOR) 40 MG tablet Take 1 tablet (40 mg total) by mouth daily. 90 tablet 3  .  losartan (COZAAR) 50 MG tablet Take 50 mg by mouth 2 (two) times daily.    . nitroGLYCERIN (NITROSTAT) 0.4 MG SL tablet Place 1 tablet (0.4 mg total) under the tongue every 5 (five) minutes x 3 doses as needed for chest pain. 10 tablet 0   No current facility-administered medications for this visit.   Allergies:  Amoxicillin   Social History: The patient  reports that he has never smoked. He has never  used smokeless tobacco. He reports current alcohol use. He reports that he does not use drugs.   Family History: [name] family history includes Lung cancer in his father.   ROS:  Please see the history of present illness. Otherwise, complete review of systems is positive for none.  All other systems are reviewed and negative.   Physical Exam: VS:  BP (!) 148/84   Pulse 63   Ht 5\' 9"  (1.753 m)   Wt 201 lb (91.2 kg)   SpO2 98%   BMI 29.68 kg/m , BMI Body mass index is 29.68 kg/m.  Wt Readings from Last 3 Encounters:  07/29/20 201 lb (91.2 kg)  06/26/20 194 lb 12.8 oz (88.4 kg)  12/22/19 193 lb (87.5 kg)    General: Patient appears comfortable at rest. Neck: Supple, no elevated JVP or carotid bruits, no thyromegaly. Lungs: Clear to auscultation, nonlabored breathing at rest. Cardiac: Regular rate and rhythm, no S3 or significant systolic murmur, no pericardial rub. Extremities: No pitting edema, distal pulses 2+. Skin: Warm and dry. Musculoskeletal: No kyphosis. Neuropsychiatric: Alert and oriented x3, affect grossly appropriate.  ECG:  EKG June 26, 2020 normal sinus rhythm rate of 66.  Recent Labwork: No results found for requested labs within last 8760 hours.     Component Value Date/Time   CHOL 193 02/01/2017 0758   TRIG 115 02/01/2017 0758   HDL 43 02/01/2017 0758   CHOLHDL 4.5 02/01/2017 0758   VLDL 23 02/01/2017 0758   LDLCALC 127 (H) 02/01/2017 0758    Other Studies Reviewed Today:  Cardiac monitor 06/26/2020 Study Highlights  ZIO XT reviewed.  13 days 21 hours analyzed.  Predominant rhythm is sinus with heart rate ranging from 37 bpm up to 162 bpm and average heart rate 66 bpm.  There were occasional PACs representing 1.7% total beats, rare atrial couplets and triplets representing less than 1% total beats.  There were also rare PVCs representing less than 1% total beats including couplets and triplets with some ventricular bigeminy and trigeminy.  Brief  episodes of NSVT noted, the longest of which was 8 beats.  There was also paroxysmal atrial flutter and atrial fibrillation representing overall 22% rhythm burden with average heart rate in the 80s and longest episode lasting just over an hour.  In addition there were several pauses noted, the longest of which lasted 4.6 seconds.  Many of the documented pauses occurred post-termination from atrial flutter and fibrillation.   Echocardiogram 07/16/2020  1. Left ventricular ejection fraction, by estimation, is 60 to 65%. The left ventricle has normal function. The left ventricle has no regional wall motion abnormalities. There is mild left ventricular hypertrophy. Left ventricular diastolic parameters are indeterminate. The average left ventricular global longitudinal strain is 17.1 %. The global longitudinal strain is normal. 2. Right ventricular systolic function is normal. The right ventricular size is normal. 3. The mitral valve is normal in structure. Trivial mitral valve regurgitation. No evidence of mitral stenosis. 4. The aortic valve is tricuspid. Aortic valve regurgitation is mild  to moderate. Comparison(s): Cardiac catheterization done 02/01/17 showed an Ef of 55-65%.    Cardiac catheterization and PCI 02/01/2017:  RPDA lesion, 50 %stenosed.  1st Mrg lesion, 30 %stenosed.  2nd Diag-2 lesion, 90 %stenosed.  2nd Diag-1 lesion, 80 %stenosed.  Prox LAD to Mid LAD lesion, 95 %stenosed.  Post intervention, there is a 0% residual stenosis.  A stent was successfully placed.  The left ventricular systolic function is normal.  LV end diastolic pressure is mildly elevated.  The left ventricular ejection fraction is 55-65% by visual estimate.  Carotid Dopplers 06/27/2019: Summary:  Right Carotid: Velocities in the right ICA are consistent with a 1-39%  stenosis.   Left Carotid: Velocities in the left ICA are consistent with a 1-39%  stenosis.   Vertebrals: Bilateral  vertebral arteries demonstrate antegrade flow.  Subclavians: Normal flow hemodynamics were seen in bilateral subclavian        arteries.  Assessment and Plan:  1. Atrial fibrillation, unspecified type (HCC)   2. Tachycardia-bradycardia syndrome (HCC)   3. SOB (shortness of breath)   4. CAD in native artery   5. Mixed hyperlipidemia   6. Uncontrolled hypertension    1. Atrial fibrillation, unspecified type [facility]) Recent cardiac monitor demonstrating episodes of atrial flutter/atrial fibrillation.  Please stop aspirin and start Eliquis 5 mg p.o. twice daily.  Heart rate is controlled today with a rate of 63.  Occasional skipped beats noted on auscultation.  Referring to Dr. Ladona Ridgel as noted below in #2.  2. Tachycardia-bradycardia syndrome [facility]) Recent cardiac monitor demonstrated predominantly sinus rhythm with heart rate ranging from 37-162 with average heart rate 66.  Occasional PACs.  Brief episodes of NSVT noted longest was 8 beats.  Also paroxysmal atrial flutter and atrial fibrillation representing 22% rhythm burden with average heart rate in 80s and longest episode lasting just over an hour.  There were several pauses noted.  The longest lasting was 4.6 seconds.  Please refer to Dr. Ladona Ridgel EP for evaluation for tachycardia-bradycardia syndrome.  3. SOB (shortness of breath) Currently denies any shortness of breath. Recent echocardiogram 07/16/2020: EF 60 to 65%.  No WMA's.  Mild LVH.  Trivial MR.  4. CAD in native artery Denies any anginal or exertional symptoms.  Continue nitroglycerin 0.4 mg as needed.  Aspirin recently stopped due to starting Eliquis for systemic anticoagulation for atrial fibrillation.  5. Mixed hyperlipidemia Please get a lipid profile.  Continue Lipitor 40 mg p.o. daily.  6. Uncontrolled hypertension Blood pressure much better since starting Norvasc.  Blood pressure today 148/84.  Please increase Norvasc to 10 mg daily.  Continue losartan 50 mg  daily.  Medication Adjustments/Labs and Tests Ordered: Current medicines are reviewed at length with the patient today.  Concerns regarding medicines are outlined above.   Disposition: Follow-up with Dr. Diona Browner or APP 3 months  Signed, Rennis Harding, NP 07/29/2020 1:28 PM    [facility] at [facility] [address] [place], [place], [place] [address] Phone: [phone]; Fax: [phone]

## 2020-07-29 ENCOUNTER — Ambulatory Visit (INDEPENDENT_AMBULATORY_CARE_PROVIDER_SITE_OTHER): Payer: Medicare HMO | Admitting: Family Medicine

## 2020-07-29 ENCOUNTER — Encounter: Payer: Self-pay | Admitting: Family Medicine

## 2020-07-29 VITALS — BP 148/84 | HR 63 | Ht 69.0 in | Wt 201.0 lb

## 2020-07-29 DIAGNOSIS — I495 Sick sinus syndrome: Secondary | ICD-10-CM

## 2020-07-29 DIAGNOSIS — I480 Paroxysmal atrial fibrillation: Secondary | ICD-10-CM

## 2020-07-29 DIAGNOSIS — I10 Essential (primary) hypertension: Secondary | ICD-10-CM

## 2020-07-29 DIAGNOSIS — E782 Mixed hyperlipidemia: Secondary | ICD-10-CM

## 2020-07-29 DIAGNOSIS — I4891 Unspecified atrial fibrillation: Secondary | ICD-10-CM | POA: Insufficient documentation

## 2020-07-29 DIAGNOSIS — I2510 Atherosclerotic heart disease of native coronary artery without angina pectoris: Secondary | ICD-10-CM | POA: Diagnosis not present

## 2020-07-29 DIAGNOSIS — R0602 Shortness of breath: Secondary | ICD-10-CM

## 2020-07-29 DIAGNOSIS — Z8679 Personal history of other diseases of the circulatory system: Secondary | ICD-10-CM

## 2020-07-29 MED ORDER — APIXABAN 5 MG PO TABS: 5.0000 mg | ORAL_TABLET | Freq: Two times a day (BID) | ORAL | 0 refills | Status: DC

## 2020-07-29 MED ORDER — APIXABAN 5 MG PO TABS: 5.0000 mg | ORAL_TABLET | Freq: Two times a day (BID) | ORAL | 6 refills | Status: DC

## 2020-07-29 MED ORDER — AMLODIPINE BESYLATE 10 MG PO TABS: 10.0000 mg | ORAL_TABLET | Freq: Every day | ORAL | 6 refills | Status: DC

## 2020-07-29 NOTE — Patient Instructions (Addendum)
Medication Instructions:   Stop Aspirin.   Begin Eliquis 5mg  twice a day.  Increase Amlodipine to 10mg  daily.   Continue all other medications.    Labwork:  FLP, LFT orders given today.   Reminder:  Nothing to eat or drink after 12 midnight prior to labs.  Can do these labs at [facility] - main entrance.   Office will contact with results via phone or letter.    Testing/Procedures: none  Follow-Up: 3 months   Any Other Special Instructions Will Be Listed Below (If Applicable). You have been referred to:  EP   If you need a refill on your cardiac medications before your next appointment, please call your pharmacy.

## 2020-08-21 ENCOUNTER — Ambulatory Visit: Payer: Medicare HMO | Admitting: Internal Medicine

## 2020-08-21 ENCOUNTER — Encounter: Payer: Self-pay | Admitting: Internal Medicine

## 2020-08-21 ENCOUNTER — Other Ambulatory Visit (HOSPITAL_COMMUNITY)
Admission: RE | Admit: 2020-08-21 | Discharge: 2020-08-21 | Disposition: A | Discharge status: Home / Self Care | Payer: Medicare HMO | Source: Ambulatory Visit | Attending: Internal Medicine | Admitting: Internal Medicine

## 2020-08-21 ENCOUNTER — Other Ambulatory Visit: Payer: Self-pay

## 2020-08-21 VITALS — BP 150/62 | HR 77 | Ht 69.0 in | Wt 201.0 lb

## 2020-08-21 DIAGNOSIS — I4891 Unspecified atrial fibrillation: Secondary | ICD-10-CM

## 2020-08-21 DIAGNOSIS — Z01812 Encounter for preprocedural laboratory examination: Secondary | ICD-10-CM | POA: Insufficient documentation

## 2020-08-21 DIAGNOSIS — Z01818 Encounter for other preprocedural examination: Secondary | ICD-10-CM | POA: Diagnosis not present

## 2020-08-21 DIAGNOSIS — I495 Sick sinus syndrome: Secondary | ICD-10-CM | POA: Insufficient documentation

## 2020-08-21 LAB — BASIC METABOLIC PANEL
Anion gap: 9 (ref 5–15)
BUN: 28 mg/dL — ABNORMAL HIGH (ref 8–23)
CO2: 24 mmol/L (ref 22–32)
Calcium: 9.4 mg/dL (ref 8.9–10.3)
Chloride: 105 mmol/L (ref 98–111)
Creatinine, Ser: 1.23 mg/dL (ref 0.61–1.24)
GFR, Estimated: >60 mL/min (ref >60)
Glucose, Bld: 113 mg/dL — ABNORMAL HIGH (ref 70–99)
Potassium: 4.4 mmol/L (ref 3.5–5.1)
Sodium: 138 mmol/L (ref 135–145)

## 2020-08-21 LAB — CBC
HCT: 41.7 % (ref 39.0–52.0)
Hemoglobin: 14 g/dL (ref 13.0–17.0)
MCH: 30.4 pg (ref 26.0–34.0)
MCHC: 33.6 g/dL (ref 30.0–36.0)
MCV: 90.7 fL (ref 80.0–100.0)
Platelets: 214 10*3/uL (ref 150–400)
RBC: 4.6 MIL/uL (ref 4.22–5.81)
RDW: 13.1 % (ref 11.5–15.5)
WBC: 12.2 10*3/uL — ABNORMAL HIGH (ref 4.0–10.5)
nRBC: 0 % (ref 0.0–0.2)

## 2020-08-21 NOTE — Patient Instructions (Addendum)
Medication Instructions:  Your physician recommends that you continue on your current medications as directed. Please refer to the Current Medication list given to you today.  *If you need a refill on your cardiac medications before your next appointment, please call your pharmacy*   Lab Work: Pre procedure labs today:  BMET & CBC.  You do NOT need to be fasting for this blood work.  If you have labs (blood work) drawn today and your tests are completely normal, you will receive your results only by: [name]. MyChart Message (if you have MyChart) OR . A paper copy in the mail If you have any lab test that is abnormal or we need to change your treatment, we will call you to review the results.   Testing/Procedures: Your physician has recommended that you have a pacemaker inserted. A pacemaker is a small device that is placed under the skin of your chest or abdomen to help control abnormal heart rhythms. This device uses electrical pulses to prompt the heart to beat at a normal rate. Pacemakers are used to treat heart rhythms that are too slow. Wire (leads) are attached to the pacemaker that goes into the chambers of you heart. This is done in the hospital and usually requires and overnight stay. Please see the instruction sheet below located under "other instructions".   Follow-Up: At [facility], you and your health needs are our priority.  As part of our continuing mission to provide you with exceptional heart care, we have created designated Provider Care Teams.  These Care Teams include your primary Cardiologist (physician) and Advanced Practice Providers (APPs -  Physician Assistants and Nurse Practitioners) who all work together to provide you with the care you need, when you need it.  Your next appointment:   2 week(s) after pacemaker implant  The format for your next appointment:   In Person  Provider:   device clinic for a wound check    Thank you for choosing [facility]!!    Other Instructions   Implantable Device Instructions  You are scheduled for: Permanent pacemaker implant on 09/09/20 with Dr. Ladona Ridgelaylor.  1.   Pre procedure testing-             A.  LAB WORK--- On 08/21/2020  for your pre procedure blood work.  You do NOT need to be fasting.   2. On the day of your procedure 09/09/2020 [facility] [phone]([address]) at 7:30 am.  Bonita QuinYou will go to the main entrance [employer] [employer]) and enter where the [employer] parking staff are.  You will check in at ADMITTING.  You may have one support person come in to the hospital with you.  They will be asked to wait in the waiting room.   3.   Do not eat or drink after midnight prior to your procedure.   4.   On the morning of your procedure do NOT take any medication.  5.  The night before your procedure and the morning of your procedure scrub your neck/chest with surgical scrub.  See instruction letter below.   5.  Plan for an overnight stay, but you may be discharged home after your procedure. If you use your phone frequently bring your phone charger, in case you have to stay.  If you are discharged after your procedure you will need someone to drive you home and be with your for 24 hours after your procedure.   6.  You will follow up with the [facility] Device clinic 10-14 days after your procedure. You will follow up with Dr. Ladona Ridgel 91 days after your procedure.  These appointments will be made for you.   * If you have ANY questions after you get home, please call the office [phone] and ask for Dierdre Highman or send a [employer].    [place] - Preparing For Surgery (surgical scrub)   Before surgery, you can play an important role. Because skin is not sterile, your skin needs to be as free of germs as possible. You can reduce the number of germs on your skin by washing with CHG (chlorahexidine gluconate) Soap before surgery.  CHG is an antiseptic cleaner which kills germs and  bonds with the skin to continue killing germs even after washing.   Please do not use if you have an allergy to CHG or antibacterial soaps.  If your skin becomes reddened/irritated stop using the CHG.   Do not shave (including legs and underarms) for at least 48 hours prior to first CHG shower.  It is OK to shave your face.  Please follow these instructions carefully:  1.  Shower the night before surgery and the morning of surgery with CHG.  2.  If you choose to wash your hair, wash your hair first as usual with your normal shampoo.  3.  After you shampoo, rinse your hair and body thoroughly to remove the shampoo.  4.  Use CHG as you would any other liquid soap.  You can apply CHG directly to the skin and wash gently with a clean washcloth. 5.  Apply the CHG Soap to your body ONLY FROM THE NECK DOWN.  Do not use on open wounds or open sores.  Avoid contact with your eyes, ears, mouth and genitals (private parts).  Wash genitals (private parts) with your normal soap.  6.  Wash thoroughly, paying special attention to the area where your surgery will be performed.  7.  Thoroughly rinse your body with warm water from the neck down.   8.  DO NOT shower/wash with your normal soap after using and rinsing off the CHG soap.  9.  Pat yourself dry with a clean towel.           10.  Wear clean pajamas.           11.  Place clean sheets on your bed the night of your first shower and do not sleep with pets.  Day of Surgery: Do not apply any deodorants/lotions.  Please wear clean clothes to the hospital/surgery center.      Pacemaker Implantation, Adult Pacemaker implantation is a procedure to place a pacemaker inside the chest. A pacemaker is a small computer that sends electrical signals to the heart and helps the heart beat normally. A pacemaker also stores information about heart rhythms. You may need pacemaker implantation if you have:  A slow heartbeat (bradycardia).  Loss of consciousness that  happens repeatedly (syncope) or repeated episodes of dizziness or light-headedness because of an irregular heart rate.  Shortness of breath (dyspnea) due to heart problems. The pacemaker usually attaches to your heart through a wire called a lead. One or two leads may be needed. There are different types of pacemakers:  Transvenous pacemaker. This type is placed under the skin or muscle of your upper chest area. The lead goes through a vein in the chest area to reach the inside of the heart.  Epicardial  pacemaker. This type is placed under the skin or muscle of your chest or abdomen. The lead goes through your chest to the outside of the heart. Tell a health care provider about:  Any allergies you have.  All medicines you are taking, including vitamins, herbs, eye drops, creams, and over-the-counter medicines.  Any problems you or family members have had with anesthetic medicines.  Any blood or bone disorders you have.  Any surgeries you have had.  Any medical conditions you have.  Whether you are pregnant or may be pregnant. What are the risks? Generally, this is a safe procedure. However, problems may occur, including:  Infection.  Bleeding.  Failure of the pacemaker or the lead.  Collapse of a lung or bleeding into a lung.  Blood clot inside a blood vessel with a lead.  Damage to the heart.  Infection inside the heart (endocarditis).  Allergic reactions to medicines. What happens before the procedure? Staying hydrated Follow instructions from your health care provider about hydration, which may include:  Up to 2 hours before the procedure - you may continue to drink clear liquids, such as water, clear fruit juice, black coffee, and plain tea.   Eating and drinking restrictions Follow instructions from your health care provider about eating and drinking, which may include:  8 hours before the procedure - stop eating heavy meals or foods, such as meat, fried foods,  or fatty foods.  6 hours before the procedure - stop eating light meals or foods, such as toast or cereal.  6 hours before the procedure - stop drinking milk or drinks that contain milk.  2 hours before the procedure - stop drinking clear liquids. Medicines Ask your health care provider about:  Changing or stopping your regular medicines. This is especially important if you are taking diabetes medicines or blood thinners.  Taking medicines such as aspirin and ibuprofen. These medicines can thin your blood. Do not take these medicines unless your health care provider tells you to take them.  Taking over-the-counter medicines, vitamins, herbs, and supplements. Tests You may have:  A heart evaluation. This may include: ? An electrocardiogram (ECG). This involves placing patches on your skin to check your heart rhythm. ? A chest X-ray. ? An echocardiogram. This is a test that uses sound waves (ultrasound) to produce an image of the heart. ? A cardiac rhythm monitor. This is used to record your heart rhythm and any events for a longer period of time.  Blood tests.  Genetic testing. General instructions  Do not use any products that contain nicotine or tobacco for at least 4 weeks before the procedure. These products include cigarettes, e-cigarettes, and chewing tobacco. If you need help quitting, ask your health care provider.  Ask your health care provider: ? How your surgery site will be marked. ? What steps will be taken to help prevent infection. These steps may include:  Removing hair at the surgery site.  Washing skin with a germ-killing soap.  Receiving antibiotic medicine.  Plan to have someone take you home from the hospital or clinic.  If you will be going home right after the procedure, plan to have someone with you for 24 hours. What happens during the procedure?  An IV will be inserted into one of your veins.  You will be given one or more of the  following: ? A medicine to help you relax (sedative). ? A medicine to numb the area (local anesthetic). ? A medicine to make you  fall asleep (general anesthetic).  The next steps vary depending on the type of pacemaker you will be getting. ? If you are getting a transvenous pacemaker:  An incision will be made in your upper chest.  A pocket will be made for the pacemaker. It may be placed under the skin or between layers of muscle.  The lead will be inserted into a blood vessel that goes to the heart.  While X-rays are taken by an imaging machine (fluoroscopy), the lead will be advanced through the vein to the inside of your heart.  The other end of the lead will be tunneled under the skin and attached to the pacemaker. ? If you are getting an epicardial pacemaker:  An incision will be made near your ribs or breastbone (sternum) for the lead.  The lead will be attached to the outside of your heart.  Another incision will be made in your chest or upper abdomen to create a pocket for the pacemaker.  The free end of the lead will be tunneled under the skin and attached to the pacemaker.  The transvenous or epicardial pacemaker will be tested. Imaging studies may be done to check the lead position.  The incisions will be closed with stitches (sutures), adhesive strips, or skin glue.  Bandages (dressings) will be placed over the incisions. The procedure may vary among health care providers and hospitals. What happens after the procedure?  Your blood pressure, heart rate, breathing rate, and blood oxygen level will be monitored until you leave the hospital or clinic.  You may be given antibiotics.  You will be given pain medicine.  An ECG and chest X-rays will be done.  You may need to wear a continuous type of ECG (Holter monitor) to check your heart rhythm.  Your health care provider will program the pacemaker.  If you were given a sedative during the procedure, it can  affect you for several hours. Do not drive or operate machinery until your health care provider says that it is safe.  You will be given a pacemaker identification card. This card lists the implant date, device model, and manufacturer of your pacemaker. Summary  A pacemaker is a small computer that sends electrical signals to the heart and helps the heart beat normally.  There are different types of pacemakers. A pacemaker may be placed under the skin or muscle of your chest or abdomen.  Follow instructions from your health care provider about eating and drinking and about taking medicines before the procedure. This information is not intended to replace advice given to you by your health care provider. Make sure you discuss any questions you have with your health care provider. Document Revised: 02/22/2019 Document Reviewed: 02/22/2019 Elsevier Patient Education  2021 [employer].

## 2020-08-21 NOTE — H&P (View-Only) (Signed)
    HPI 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 He is a pleasant 79 yo man with recent Covid and was found to have atrial flutter and fib with an RVR as well as significant bradycardia with pauses of over 4.5 seconds. He also had rates over 150/min. He has dizziness and fatigue but denies frank syncope. Minimal palpitations. He was breathless but has improved over time.  Allergies  Allergen Reactions  . Amoxicillin Itching     Current Outpatient Medications  Medication Sig Dispense Refill  . amLODipine (NORVASC) 10 MG tablet Take 1 tablet (10 mg total) by mouth daily. 30 tablet 6  . apixaban (ELIQUIS) 5 MG TABS tablet Take 1 tablet (5 mg total) by mouth 2 (two) times daily. 60 tablet 6  . atorvastatin (LIPITOR) 40 MG tablet Take 1 tablet (40 mg total) by mouth daily. 90 tablet 3  . nitroGLYCERIN (NITROSTAT) 0.4 MG SL tablet Place 1 tablet (0.4 mg total) under the tongue every 5 (five) minutes x 3 doses as needed for chest pain. 10 tablet 0   No current facility-administered medications for this visit.     Past Medical History:  Diagnosis Date  . CAD (coronary artery disease)    DES to mid LAD October 2018  . Essential hypertension   . NSTEMI (non-ST elevated myocardial infarction) (HCC) 01/2017    ROS:   All systems reviewed and negative except as noted in the HPI.   Past Surgical History:  Procedure Laterality Date  . CORONARY STENT INTERVENTION N/A 02/01/2017   Procedure: CORONARY STENT INTERVENTION;  Surgeon: Berry, Jonathan J, MD;  Location: [facility];  Service: Cardiovascular;  Laterality: N/A;  LAD Prox-Mid lesion stented  . LEFT HEART CATH AND CORONARY ANGIOGRAPHY N/A 02/01/2017   Procedure: LEFT HEART CATH AND CORONARY ANGIOGRAPHY;  Surgeon: Berry, Jonathan J, MD;  Location: [facility] CV LAB;  Service: Cardiovascular;  Laterality: N/A;     Family History  Problem Relation Age of Onset  . Lung cancer Father       Social History   Socioeconomic History  . Marital status: Married    Spouse name: Not on file  . Number of children: Not on file  . Years of education: Not on file  . Highest education level: Not on file  Occupational History  . Not on file  Tobacco Use  . Smoking status: Never Smoker  . Smokeless tobacco: Never Used  Substance and Sexual Activity  . Alcohol use: Yes    Comment: Occasional  . Drug use: No  . Sexual activity: Not on file  Other Topics Concern  . Not on file  Social History Narrative  . Not on file   Social Determinants of Health   Financial Resource Strain: Not on file  Food Insecurity: Not on file  Transportation Needs: Not on file  Physical Activity: Not on file  Stress: Not on file  Social Connections: Not on file  Intimate Partner Violence: Not on file     BP (!) 150/62 (BP Location: Left Arm, Patient Position: Sitting, Cuff Size: Normal)   Pulse 77   Ht 5' 9" (1.753 m)   Wt 201 lb (91.2 kg)   SpO2 97%   BMI 29.68 kg/m   Physical Exam:  Well appearing NAD HEENT: Unremarkable Neck:  No JVD, no thyromegally Lymphatics:  No adenopathy Back:  No CVA tenderness Lungs:  Clear with no wheezes HEART:  Regular rate rhythm,   no murmurs, no rubs, no clicks Abd:  soft, positive bowel sounds, no organomegally, no rebound, no guarding Ext:  2 plus pulses, no edema, no cyanosis, no clubbing Skin:  No rashes no nodules Neuro:  CN II through XII intact, motor grossly intact  DEVICE  Normal device function.  See PaceArt for details.   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 I have reviewed the risks/benefits/goals/expectations of the procedure and he wishes to proceed. 2. Atrial flutter - he is minimally symptomatic. We will follow once his PPM is in place. 3. Atrial fib - he has minimal symptoms. He will continue blood thinners.   Calvin Cochran Calvin Afzal,MD

## 2020-08-21 NOTE — Progress Notes (Signed)
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 He is a pleasant 80 yo man with recent Covid and was found to have atrial flutter and fib with an RVR as well as significant bradycardia with pauses of over 4.5 seconds. He also had rates over 150/min. He has dizziness and fatigue but denies frank syncope. Minimal palpitations. He was breathless but has improved over time.  Allergies  Allergen Reactions  . Amoxicillin Itching     Current Outpatient Medications  Medication Sig Dispense Refill  . amLODipine (NORVASC) 10 MG tablet Take 1 tablet (10 mg total) by mouth daily. 30 tablet 6  . apixaban (ELIQUIS) 5 MG TABS tablet Take 1 tablet (5 mg total) by mouth 2 (two) times daily. 60 tablet 6  . atorvastatin (LIPITOR) 40 MG tablet Take 1 tablet (40 mg total) by mouth daily. 90 tablet 3  . nitroGLYCERIN (NITROSTAT) 0.4 MG SL tablet Place 1 tablet (0.4 mg total) under the tongue every 5 (five) minutes x 3 doses as needed for chest pain. 10 tablet 0   No current facility-administered medications for this visit.     Past Medical History:  Diagnosis Date  . CAD (coronary artery disease)    DES to mid LAD October 2018  . Essential hypertension   . NSTEMI (non-ST elevated myocardial infarction) (HCC) 01/2017    ROS:   All systems reviewed and negative except as noted in the HPI.   Past Surgical History:  Procedure Laterality Date  . CORONARY STENT INTERVENTION N/A 02/01/2017   Procedure: CORONARY STENT INTERVENTION;  Surgeon: Runell Gess, MD;  Location: [facility];  Service: Cardiovascular;  Laterality: N/A;  LAD Prox-Mid lesion stented  . LEFT HEART CATH AND CORONARY ANGIOGRAPHY N/A 02/01/2017   Procedure: LEFT HEART CATH AND CORONARY ANGIOGRAPHY;  Surgeon: Runell Gess, MD;  Location: [facility];  Service: Cardiovascular;  Laterality: N/A;     Family History  Problem Relation Age of Onset  . Lung cancer Father       Social History   Socioeconomic History  . Marital status: Married    Spouse name: Not on file  . Number of children: Not on file  . Years of education: Not on file  . Highest education level: Not on file  Occupational History  . Not on file  Tobacco Use  . Smoking status: Never Smoker  . Smokeless tobacco: Never Used  Substance and Sexual Activity  . Alcohol use: Yes    Comment: Occasional  . Drug use: No  . Sexual activity: Not on file  Other Topics Concern  . Not on file  Social History Narrative  . Not on file   Social Determinants of Health   Financial Resource Strain: Not on file  Food Insecurity: Not on file  Transportation Needs: Not on file  Physical Activity: Not on file  Stress: Not on file  Social Connections: Not on file  Intimate Partner Violence: Not on file     BP (!) 150/62 (BP Location: Left Arm, Patient Position: Sitting, Cuff Size: Normal)   Pulse 77   Ht 5\' 9"  (1.753 m)   Wt 201 lb (91.2 kg)   SpO2 97%   BMI 29.68 kg/m   Physical Exam:  Well appearing NAD HEENT: Unremarkable Neck:  No JVD, no thyromegally Lymphatics:  No adenopathy Back:  No CVA tenderness Lungs:  Clear with no wheezes HEART:  Regular rate rhythm,  no murmurs, no rubs, no clicks Abd:  soft, positive bowel sounds, no organomegally, no rebound, no guarding Ext:  2 plus pulses, no edema, no cyanosis, no clubbing Skin:  No rashes no nodules Neuro:  CN II through XII intact, motor grossly intact  DEVICE  Normal device function.  See PaceArt for details.   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 I have reviewed the risks/benefits/goals/expectations of the procedure and he wishes to proceed. 2. Atrial flutter - he is minimally symptomatic. We will follow once his PPM is in place. 3. Atrial fib - he has minimal symptoms. He will continue blood thinners.   Sharlot Gowda Raul Torrance,MD

## 2020-09-06 NOTE — Pre-Procedure Instructions (Signed)
Attempted to call patient regarding procedure instructions for Monday.  Left voice mail on the following items: Arrival time 0730 Nothing to eat or drink after midnight No meds AM of procedure Responsible person to drive you home and stay with you for 24 hrs Wash with special soap night before and morning of procedure If on anti-coagulant drug instructions Eliquis-  Last dose Saturday morning 6/4, none on Saturday evening or Sunday.

## 2020-09-09 ENCOUNTER — Ambulatory Visit (HOSPITAL_COMMUNITY)
Admission: RE | Admit: 2020-09-09 | Discharge: 2020-09-09 | Disposition: A | Discharge status: Home / Self Care | Payer: Medicare HMO | Source: Ambulatory Visit | Attending: Internal Medicine | Admitting: Internal Medicine

## 2020-09-09 ENCOUNTER — Ambulatory Visit (HOSPITAL_COMMUNITY): Payer: Medicare HMO

## 2020-09-09 ENCOUNTER — Ambulatory Visit (HOSPITAL_COMMUNITY)
Admission: RE | Disposition: A | Discharge status: Home / Self Care | Payer: Medicare HMO | Source: Ambulatory Visit | Attending: Internal Medicine

## 2020-09-09 ENCOUNTER — Encounter (HOSPITAL_COMMUNITY): Payer: Self-pay | Admitting: Internal Medicine

## 2020-09-09 ENCOUNTER — Other Ambulatory Visit: Payer: Self-pay

## 2020-09-09 DIAGNOSIS — I4891 Unspecified atrial fibrillation: Secondary | ICD-10-CM | POA: Insufficient documentation

## 2020-09-09 DIAGNOSIS — I4892 Unspecified atrial flutter: Secondary | ICD-10-CM | POA: Insufficient documentation

## 2020-09-09 DIAGNOSIS — Z79899 Other long term (current) drug therapy: Secondary | ICD-10-CM | POA: Diagnosis not present

## 2020-09-09 DIAGNOSIS — Z880 Allergy status to penicillin: Secondary | ICD-10-CM | POA: Diagnosis not present

## 2020-09-09 DIAGNOSIS — I495 Sick sinus syndrome: Secondary | ICD-10-CM | POA: Diagnosis not present

## 2020-09-09 DIAGNOSIS — Z8616 Personal history of COVID-19: Secondary | ICD-10-CM | POA: Insufficient documentation

## 2020-09-09 DIAGNOSIS — Z7901 Long term (current) use of anticoagulants: Secondary | ICD-10-CM | POA: Diagnosis not present

## 2020-09-09 DIAGNOSIS — Z950 Presence of cardiac pacemaker: Secondary | ICD-10-CM

## 2020-09-09 HISTORY — PX: PACEMAKER IMPLANT: EP1218

## 2020-09-09 SURGERY — PACEMAKER IMPLANT

## 2020-09-09 MED ORDER — MIDAZOLAM HCL 5 MG/5ML IJ SOLN
INTRAMUSCULAR | Status: DC | PRN
  Administered 2020-09-09 (×2): 2 mg via INTRAVENOUS

## 2020-09-09 MED ORDER — MIDAZOLAM HCL 5 MG/5ML IJ SOLN
INTRAMUSCULAR | Status: AC
  Filled 2020-09-09: qty 5

## 2020-09-09 MED ORDER — CHLORHEXIDINE GLUCONATE 4 % EX LIQD: 4.0000 "application " | Freq: Once | CUTANEOUS | Status: DC

## 2020-09-09 MED ORDER — SODIUM CHLORIDE 0.9 % IV SOLN
80.0000 mg | INTRAVENOUS | Status: AC
  Administered 2020-09-09: 80 mg

## 2020-09-09 MED ORDER — FENTANYL CITRATE (PF) 100 MCG/2ML IJ SOLN
INTRAMUSCULAR | Status: AC
  Filled 2020-09-09: qty 2

## 2020-09-09 MED ORDER — LIDOCAINE HCL (PF) 1 % IJ SOLN
INTRAMUSCULAR | Status: DC | PRN
  Administered 2020-09-09: 60 mL

## 2020-09-09 MED ORDER — ONDANSETRON HCL 4 MG/2ML IJ SOLN: 4.0000 mg | Freq: Four times a day (QID) | INTRAMUSCULAR | Status: DC | PRN

## 2020-09-09 MED ORDER — LIDOCAINE HCL (PF) 1 % IJ SOLN
INTRAMUSCULAR | Status: AC
  Filled 2020-09-09: qty 60

## 2020-09-09 MED ORDER — SODIUM CHLORIDE 0.9 % IV SOLN: INTRAVENOUS | Status: DC

## 2020-09-09 MED ORDER — HEPARIN (PORCINE) IN NACL 1000-0.9 UT/500ML-% IV SOLN
INTRAVENOUS | Status: DC | PRN
  Administered 2020-09-09 (×2): 500 mL

## 2020-09-09 MED ORDER — FENTANYL CITRATE (PF) 100 MCG/2ML IJ SOLN
INTRAMUSCULAR | Status: DC | PRN
  Administered 2020-09-09 (×2): 25 ug via INTRAVENOUS

## 2020-09-09 MED ORDER — ACETAMINOPHEN 325 MG PO TABS
325.0000 mg | ORAL_TABLET | ORAL | Status: DC | PRN
Start: 2020-09-09 — End: 2020-09-09

## 2020-09-09 MED ORDER — VANCOMYCIN HCL IN DEXTROSE 1-5 GM/200ML-% IV SOLN
INTRAVENOUS | Status: AC
  Filled 2020-09-09: qty 200

## 2020-09-09 MED ORDER — SODIUM CHLORIDE 0.9 % IV SOLN
INTRAVENOUS | Status: AC
  Filled 2020-09-09: qty 2

## 2020-09-09 MED ORDER — VANCOMYCIN HCL 1000 MG/200ML IV SOLN
1000.0000 mg | INTRAVENOUS | Status: AC
  Administered 2020-09-09: 1000 mg via INTRAVENOUS

## 2020-09-09 SURGICAL SUPPLY — 7 items
CABLE SURGICAL S-101-97-12 (CABLE) ×2 IMPLANT
LEAD TENDRIL MRI 46CM LPA1200M (Lead) ×2 IMPLANT
LEAD TENDRIL MRI 52CM LPA1200M (Lead) ×2 IMPLANT
PACEMAKER ASSURITY DR-RF (Pacemaker) ×2 IMPLANT
PAD PRO RADIOLUCENT 2001M-C (PAD) ×2 IMPLANT
SHEATH 8FR PRELUDE SNAP 13 (SHEATH) ×4 IMPLANT
TRAY PACEMAKER INSERTION (PACKS) ×2 IMPLANT

## 2020-09-09 NOTE — Discharge Instructions (Signed)
Pacemaker Implantation, Adult, Care After This sheet gives you information about how to care for yourself after your procedure. Your health care provider may also give you more specific instructions. If you have problems or questions, contact your health care provider. What can I expect after the procedure? After the procedure, it is common to have:  Mild pain.  Slight bruising.  Some swelling over the incisions.  A slight bump over the skin where the device was placed (if it was implanted in the upper chest area). Sometimes, it is possible to feel the device under the skin. This is normal. Follow these instructions at home: Medicines  Take over-the-counter and prescription medicines only as told by your health care provider.  If you were prescribed an antibiotic medicine, take it as told by your health care provider. Do not stop taking the antibiotic even if you start to feel better.  Ask your health care provider if the medicine prescribed to you requires you to avoid driving or using machinery. Incision site care  Do not remove the bandage (dressing) on your chest until you are told to do so by your health care provider.  After your dressing is removed, you may see pieces of tape called skin adhesive strips over the incision site. Let the strips fall off on their own.  Check your incision area every day for signs of infection. Check for: ? More redness, swelling, or pain. ? Fluid or blood. ? Warmth. ? Pus or a bad smell.  Do not use lotions or ointments near the incision site unless you are told to do so.  Keep the incision area clean and dry for 2-3 days after the procedure or as told by your health care provider. It takes several weeks for the incision site to completely heal.  Women may want to place a small pad over the incision site to protect it from their bra strap.  Do not take baths, swim, or use a hot tub for 7-10 days or until your health care  provider approves. Ask your health care provider if you may take showers. You may only be allowed to take sponge baths.   Activity  If you were given a medicine to help you relax (sedative) during the procedure, it can affect you for several hours. Do not drive or operate machinery until your health care provider says that it is safe.  Return to your normal activities as told by your health care provider. Ask your health care provider what activities are safe for you.  Do not lift anything that is heavier than 10 lb (4.5 kg), or the limit that you are told, until your health care provider says that it is safe.  Avoid sudden jerking, pulling, or chopping movements that pull your upper arm far away from your body. Avoid these movements for at least 6 weeks or as long as told by your health care provider.  Do not lift your upper arm above your shoulders for at least 6 weeks or as long as told by your health care provider. This includes activities like tennis, golf, or swimming.  You may go back to work when your health care provider says it is okay.   Electricity and magnetic fields  Avoid places or objects that have a strong electric or magnetic field, including: ? Airport Scientist, forensic. When at the airport, let officials know that you have a pacemaker. Carry your pacemaker ID card. ? Metal  detectors. If you must pass through a metal detector, walk through it quickly. Do not stop under the detector or stand near it. ? Power plants. ? Large electrical generators. ? Radiofrequency transmission towers, such as mobile phone and radio towers.  Do not use amateur Proofreader. If you are unsure of whether something is safe to use, ask your health care provider. Some devices may be safe to use if you hold them at least 1 ft (0.3 m) from your pacemaker. These devices may include power tools, lawn mowers, and speakers.  When using your mobile phone, hold it to the  ear opposite the pacemaker. Do not leave your mobile phone in a pocket over the pacemaker. Long-term care  You may be shown how to transfer data from your pacemaker through the phone to your health care provider.  Always let all health care providers, including dentists, know about your pacemaker before you have any medical procedures or tests.  Wear a medical ID bracelet or necklace stating that you have a pacemaker.  Carry a pacemaker ID card with you at all times.  Your pacemaker battery will last for 5-15 years. Your health care provider will do routine checks to know when the battery is starting to run down. When this happens, the pacemaker will need to be replaced. General instructions  Do not use any products that contain nicotine or tobacco, such as cigarettes, e-cigarettes, and chewing tobacco. These can delay incision healing after surgery. If you need help quitting, ask your health care provider.  Follow instructions from your health care provider about eating or drinking restrictions.  Weigh yourself every day. If you suddenly gain weight, fluid may be building up in your body.  Keep all follow-up visits as told by your health care provider. This is important. During follow-up visits, your pacemaker will be checked and reprogrammed if necessary. Contact a health care provider if:  You gain weight suddenly.  Your legs or feet swell.  It feels like your heart is fluttering or skipping beats (you have heart palpitations).  You have any of these signs of infection: ? More redness, swelling, or pain around an incision. ? Fluid or blood coming from an incision. ? Warmth coming from an incision. ? Pus or a bad smell coming from an incision. ? A fever or chills. Get help right away if:  You have chest pain.  You have trouble breathing or are short of breath.  You become extremely tired.  You are light-headed or you faint. These symptoms may represent a serious problem  that is an emergency. Do not wait to see if the symptoms will go away. Get medical help right away. Call your local emergency services (911 in the [place] Do not drive yourself to the hospital. Summary  After the procedure, it is common to have mild pain, swelling, or bruising over the incision.  Take over-the-counter and prescription medicines only as told by your health care provider.  Do not raise your arm above your shoulder or lift anything that is heavier than 10 lb (4.5 kg), or the limit that you are told, until your health care provider says that it is safe.  Carry a pacemaker ID card with you at all times. This information is not intended to replace advice given to you by your health care provider. Make sure you discuss any questions you have with your health care provider. Document Revised: 02/22/2019 Document Reviewed: 02/22/2019 Elsevier Patient Education  2021  [employer].       Supplemental Discharge Instructions for  Pacemaker/Defibrillator Patients  Tomorrow, 09/10/20, send in a device transmission  Activity No heavy lifting or vigorous activity with your left/right arm for 6 to 8 weeks.  Do not raise your left/right arm above your head for one week.  Gradually raise your affected arm as drawn below.             09/17/20                    09/18/20                     09/19/20                   09/20/20 __  NO DRIVING until cleared to at your wound check visit  WOUND CARE - Keep the wound area clean and dry.  Do not get this area wet , no showers for one week; you may shower on     . - Tomorrow, 09/10/20, remove the arm sling - Tomorrow, 09/10/20 remove the LARGE outer plastic bandage.  Underneath the plastic bandage there are steri strips (paper tapes), DO NOT remove these. - The tape/steri-strips on your wound will fall off; do not pull them off.  No bandage is needed on the site.  DO  NOT apply any creams, oils, or ointments to the wound area. - If you notice any  drainage or discharge from the wound, any swelling or bruising at the site, or you develop a fever > 101? F after you are discharged home, call the office at once.  Special Instructions - You are still able to use cellular telephones; use the ear opposite the side where you have your pacemaker/defibrillator.  Avoid carrying your cellular phone near your device. - When traveling through airports, show security personnel your identification card to avoid being screened in the metal detectors.  Ask the security personnel to use the hand wand. - Avoid arc welding equipment, MRI testing (magnetic resonance imaging), TENS units (transcutaneous nerve stimulators).  Call the office for questions about other devices. - Avoid electrical appliances that are in poor condition or are not properly grounded. - Microwave ovens are safe to be near or to operate.

## 2020-09-09 NOTE — Interval H&P Note (Signed)
History and Physical Interval Note:  09/09/2020 10:32 AM  Calvin Cochran  has presented today for surgery, with the diagnosis of heart block.  The various methods of treatment have been discussed with the patient and family. After consideration of risks, benefits and other options for treatment, the patient has consented to  Procedure(s): PACEMAKER IMPLANT (N/A) as a surgical intervention.  [name] history has been reviewed, patient examined, no change in status, stable for surgery.  I have reviewed the patient's chart and labs.  Questions were answered to the patient's satisfaction.     Lewayne Bunting

## 2020-09-10 ENCOUNTER — Telehealth: Payer: Self-pay | Admitting: Emergency Medicine

## 2020-09-10 MED FILL — Vancomycin HCl IV Soln 1000 MG/200ML (Base Equivalent): INTRAVENOUS | Qty: 200 | Status: AC

## 2020-09-10 MED FILL — Gentamicin Sulfate Inj 40 MG/ML: INTRAMUSCULAR | Qty: 80 | Status: AC

## 2020-09-10 NOTE — Telephone Encounter (Signed)
-----   Message from Sheilah Pigeon, [place] sent at 09/09/2020  2:29 PM EDT ----- SJM PPM implant  Same day d/c  GT

## 2020-09-10 NOTE — Telephone Encounter (Signed)
LMOM to call device clinic with # and hours. Same day discharge for ST Jude ppm implant 09/09/20. Wound check : 09/19/20 @ 1400 91 day F/U with Gt : 12/24/20 in [place]

## 2020-09-11 NOTE — Telephone Encounter (Signed)
Second unsuccessful telephone call to patient to follow up after same day discharge 09/09/20 s/p St. Jude PPM implant. Hipaa compliant vm message left requesting call back to [phone].

## 2020-09-12 NOTE — Telephone Encounter (Signed)
The patient wife returned nurses call. Her name is Calvin Cochran. I told her the nurse will call her back.

## 2020-09-12 NOTE — Telephone Encounter (Signed)
Follow-up after same day discharge: Implant date: 09/09/2020 MD: Lewayne Bunting, MD Device: PPM Assurity 2722 MRI Location: Right Chest   Wound check visit: 09/19/20 @ 2:00 PM 90 day MD follow-up: 12/24/20 @ 1:00 PM  Remote Transmission received: Yes.   Dressing removed: Yes Sling Removed: Yes Wound Site: WNL Wound care, lifting restrictions and mobility discussed with patient with verbal understanding.

## 2020-09-12 NOTE — Telephone Encounter (Signed)
3rd attempt to contact patient. Attempted to call patient and Darl Pikes on [place]. No answer, Left HIPPA compliant VM.

## 2020-09-19 ENCOUNTER — Other Ambulatory Visit: Payer: Self-pay

## 2020-09-19 ENCOUNTER — Ambulatory Visit (INDEPENDENT_AMBULATORY_CARE_PROVIDER_SITE_OTHER): Payer: Medicare HMO

## 2020-09-19 DIAGNOSIS — I495 Sick sinus syndrome: Secondary | ICD-10-CM

## 2020-09-19 DIAGNOSIS — I480 Paroxysmal atrial fibrillation: Secondary | ICD-10-CM

## 2020-09-19 LAB — CUP PACEART INCLINIC DEVICE CHECK
Battery Remaining Longevity: 126 mo
Battery Voltage: 3.07 V
Brady Statistic RA Percent Paced: 14 %
Brady Statistic RV Percent Paced: 1.7 %
Date Time Interrogation Session: 20220616151908
Implantable Lead Implant Date: 20220606
Implantable Lead Implant Date: 20220606
Implantable Lead Location: 753859
Implantable Lead Location: 753860
Implantable Pulse Generator Implant Date: 20220606
Lead Channel Impedance Value: 425 Ohm
Lead Channel Impedance Value: 587.5 Ohm
Lead Channel Pacing Threshold Amplitude: 0.25 V
Lead Channel Pacing Threshold Pulse Width: 0.5 ms
Lead Channel Sensing Intrinsic Amplitude: 12 mV
Lead Channel Sensing Intrinsic Amplitude: 2.9 mV
Lead Channel Setting Pacing Amplitude: 3.5 V
Lead Channel Setting Pacing Amplitude: 3.5 V
Lead Channel Setting Pacing Pulse Width: 0.5 ms
Lead Channel Setting Sensing Sensitivity: 2 mV
Pulse Gen Model: 2272
Pulse Gen Serial Number: 6460902

## 2020-09-19 NOTE — Progress Notes (Signed)
"  After Your Pacemaker" instructions given to patient with verbal education as well.   Wound check appointment. Steri-strips removed. Wound without redness or edema. Incision edges approximated, wound well healed. Normal device function. Presenting rhythm would vary from AP/VP, AF w/ w/ controlled rates as well as RVR. Patient asymptomatic. Advised to call if any symptoms arise, verbalized understanding.  Thresholds, sensing, and impedances consistent with implant measurements. Device programmed at 3.5V/auto capture programmed on for extra safety margin until 3 month visit. RV auto capture programmed off and fixed RV output to 3.5V for extra safety margin. Unable to run auto capture d/t AF w/ RVR and brady-tachy. Histogram distribution appropriate for patient and level of activity. AT/AF burden 4.7%, EGM's appear AF with peak VR 165 bpm, longest episode 36 minutes. OAC on file. Patient educated about wound care, arm mobility, lifting restrictions. ROV in 3 months with implanting physician.  Spoke with Dr. Elberta Fortis about patient and presenting rhythm, advised to send to Dr. Ladona Ridgel to review. No call warranted. Patient updated and made aware. Patient is aware to call if any symptoms arise.

## 2020-09-27 ENCOUNTER — Telehealth: Payer: Self-pay | Admitting: Cardiology

## 2020-09-27 MED ORDER — APIXABAN 5 MG PO TABS: 5.0000 mg | ORAL_TABLET | Freq: Two times a day (BID) | ORAL | 2 refills | Status: DC

## 2020-09-27 NOTE — Telephone Encounter (Signed)
Per patient, eliquis copay is $65/mth and he can not afford this. Reports 2 people in household and total income per year is around $33,000. Advised that he would qualify and that we will provide BMS-PAF that he is to fill out all patient sections and bring back to office with proof of income and prescription out of pocket expense for 2022. Verbalized understanding.

## 2020-09-27 NOTE — Telephone Encounter (Signed)
New message     Pt c/o medication issue:  1. Name of Medication: eliquis   2. How are you currently taking this medication (dosage and times per day)? 2x a day   3. Are you having a reaction (difficulty breathing--STAT)? no  4. What is your medication issue? Insurance does not want to cover this med and he helps dealing with the cost

## 2020-09-27 NOTE — Addendum Note (Signed)
Addended by: Eustace Moore on: 09/27/2020 12:05 PM   Modules accepted: Orders

## 2020-10-28 NOTE — Progress Notes (Signed)
Cardiology Office Note  Date: 10/29/2020   ID: Calvin Cochran, DOB 05-02-1940, MRN [id number]  PCP:  System, Provider Not In  Cardiologist:  Nona Dell, MD Electrophysiologist:  None   Chief Complaint: 42-month follow-up  History of Present Illness: Calvin Cochran is a 80 y.o. male with a history of CAD, HTN, NSTEMI, tachybradycardia syndrome, atrial fibrillation.  He was last seen by Dr. Ladona Ridgel on 08/21/2020 for atrial fibrillation and tachybradycardia syndrome.  Dr. Ladona Ridgel had discussed treatment options with the patient and the need for PPM insertion.  Patient agreed to proceed.  His atrial flutter was minimally symptomatic.  Plans were to follow once PPM was in place.  He was continuing blood thinners for his atrial fibrillation.  Dr. 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  Patient had no complications.  Patient had subsequent remote device check on 09/20/2020 which showed normal device function.  Here today for 44-month follow-up.  States he is doing well with his pacemaker.  The pacemaker was placed on the right side due to the patient being left-handed.  He denies any sensation of palpitations or arrhythmias.  Denies any orthostatic symptoms, CVA or TIA-like symptoms, DOE or SOB, PND, orthopnea.  Denies any bleeding issues.  No claudication-like symptoms, DVT or PE-like symptoms, or lower extremity edema.  Past Medical History:  Diagnosis Date   CAD (coronary artery disease)    DES to mid LAD October 2018   Essential hypertension    NSTEMI (non-ST elevated myocardial infarction) (HCC) 01/2017    Past Surgical History:  Procedure Laterality Date   CORONARY STENT INTERVENTION N/A 02/01/2017   Procedure: CORONARY STENT INTERVENTION;  Surgeon: Runell Gess, MD;  Location: [facility] INVASIVE [facility];  Service: Cardiovascular;  Laterality: N/A;  LAD Prox-Mid lesion  stented   LEFT HEART CATH AND CORONARY ANGIOGRAPHY N/A 02/01/2017   Procedure: LEFT HEART CATH AND CORONARY ANGIOGRAPHY;  Surgeon: Runell Gess, MD;  Location: [facility] INVASIVE CV LAB;  Service: Cardiovascular;  Laterality: N/A;   PACEMAKER IMPLANT N/A 09/09/2020   Procedure: PACEMAKER IMPLANT;  Surgeon: Marinus Maw, MD;  Location: [facility] INVASIVE CV LAB;  Service: Cardiovascular;  Laterality: N/A;    Current Outpatient Medications  Medication Sig Dispense Refill   amLODipine (NORVASC) 10 MG tablet Take 1 tablet (10 mg total) by mouth daily. 30 tablet 6   apixaban (ELIQUIS) 5 MG TABS tablet Take 1 tablet (5 mg total) by mouth 2 (two) times daily. 180 tablet 2   atorvastatin (LIPITOR) 40 MG tablet Take 1 tablet (40 mg total) by mouth daily. 90 tablet 3   losartan (COZAAR) 50 MG tablet Take 50 mg by mouth daily.     nitroGLYCERIN (NITROSTAT) 0.4 MG SL tablet Place 1 tablet (0.4 mg total) under the tongue every 5 (five) minutes x 3 doses as needed for chest pain. 10 tablet 0   No current facility-administered medications for this visit.   Allergies:  Amoxicillin   Social History: The patient  reports that he has never smoked. He has never used smokeless tobacco. He reports current alcohol use. He reports that he does not use drugs.   Family History: [name] family history includes Lung cancer in his father.   ROS:  Please see the history of present illness. Otherwise, complete review of systems is positive for none.  All other systems are reviewed and negative.   Physical Exam: VS:  BP [name])  146/82   Pulse 72   Ht 5\' 9"  (1.753 m)   Wt 202 lb (91.6 kg)   SpO2 98%   BMI 29.83 kg/m , BMI Body mass index is 29.83 kg/m.  Wt Readings from Last 3 Encounters:  10/29/20 202 lb (91.6 kg)  09/09/20 188 lb (85.3 kg)  08/21/20 201 lb (91.2 kg)    General: Patient appears comfortable at rest. Neck: Supple, no elevated JVP or carotid bruits, no thyromegaly. Lungs: Clear to auscultation,  nonlabored breathing at rest. Cardiac: Regular rate and rhythm, no S3 or significant systolic murmur, no pericardial rub. Extremities: No pitting edema, distal pulses 2+. Skin: Warm and dry.  Pacemaker pocket on the right side clean and dry and well-healed. Musculoskeletal: No kyphosis. Neuropsychiatric: Alert and oriented x3, affect grossly appropriate.  ECG:  June 26, 2020 normal sinus rhythm rate of 66.  Recent Labwork: 08/21/2020: BUN 28; Creatinine, Ser 1.23; Hemoglobin 14.0; Platelets 214; Potassium 4.4; Sodium 138     Component Value Date/Time   CHOL 193 02/01/2017 0758   TRIG 115 02/01/2017 0758   HDL 43 02/01/2017 0758   CHOLHDL 4.5 02/01/2017 0758   VLDL 23 02/01/2017 0758   LDLCALC 127 (H) 02/01/2017 0758    Other Studies Reviewed Today:    Cardiac monitor 07/21/2020 Study Highlights   ZIO XT reviewed.  13 days 21 hours analyzed.  Predominant rhythm is sinus with heart rate ranging from 37 bpm up to 162 bpm and average heart rate 66 bpm.  There were occasional PACs representing 1.7% total beats, rare atrial couplets and triplets representing less than 1% total beats.  There were also rare PVCs representing less than 1% total beats including couplets and triplets with some ventricular bigeminy and trigeminy.  Brief episodes of NSVT noted, the longest of which was 8 beats.  There was also paroxysmal atrial flutter and atrial fibrillation representing overall 22% rhythm burden with average heart rate in the 80s and longest episode lasting just over an hour.  In addition there were several pauses noted, the longest of which lasted 4.6 seconds.  Many of the documented pauses occurred post-termination from atrial flutter and fibrillation.     Echocardiogram 07/16/2020  1. Left ventricular ejection fraction, by estimation, is 60 to 65%. The left ventricle has normal function. The left ventricle has no regional wall motion abnormalities. There is mild left ventricular hypertrophy.  Left ventricular diastolic parameters are indeterminate. The average left ventricular global longitudinal strain is 17.1 %. The global longitudinal strain is normal. 2. Right ventricular systolic function is normal. The right ventricular size is normal. 3. The mitral valve is normal in structure. Trivial mitral valve regurgitation. No evidence of mitral stenosis. 4. The aortic valve is tricuspid. Aortic valve regurgitation is mild to moderate. Comparison(s): Cardiac catheterization done 02/01/17 showed an Ef of 55-65%.       Cardiac catheterization and PCI 02/01/2017: RPDA lesion, 50 %stenosed. 1st Mrg lesion, 30 %stenosed. 2nd Diag-2 lesion, 90 %stenosed. 2nd Diag-1 lesion, 80 %stenosed. Prox LAD to Mid LAD lesion, 95 %stenosed. Post intervention, there is a 0% residual stenosis. A stent was successfully placed. The left ventricular systolic function is normal. LV end diastolic pressure is mildly elevated. The left ventricular ejection fraction is 55-65% by visual estimate.   Carotid Dopplers 06/27/2019: Summary:  Right Carotid: Velocities in the right ICA are consistent with a 1-39%  stenosis.   Left Carotid: Velocities in the left ICA are consistent with a 1-39%  stenosis.  Vertebrals:  Bilateral vertebral arteries demonstrate antegrade flow.  Subclavians: Normal flow hemodynamics were seen in bilateral subclavian               arteries. Assessment and Plan:  1. Pacemaker   2. Tachycardia-bradycardia syndrome (HCC)   3. Paroxysmal atrial fibrillation (HCC)   4. CAD in native artery   5. Uncontrolled hypertension   6. Mixed hyperlipidemia    1. Pacemaker Status post pacemaker placement Dr. Dr. Ladona Ridgel on 09/09/2020.  Recent remote device check was normal.  Pacemaker site on the right side given the patient is left-handed.  2. Tachycardia-bradycardia syndrome (HCC) History of tachybradycardia syndrome with 4-second pauses on cardiac monitor requiring pacemaker placement as  noted above.    3. Paroxysmal atrial fibrillation (HCC) Denies any current palpitations or arrhythmias.  Heart rate today is 72 and regular.  Continue Eliquis 5 mg p.o. twice daily  4. CAD in native artery Denies any anginal or exertional symptoms.  Continue nitroglycerin as needed  5. Uncontrolled hypertension Blood pressure today 146/82.  Patient states his blood pressure is usually in the 130s over 70s to low 80s.  Advised him to check his blood pressure periodically and let us know if blood pressures are sustained at or below 130s over 80 consistently.  Continue amlodipine 10 mg daily.  Continue losartan 50 mg daily.   6. Mixed hyperlipidemia Continue atorvastatin 40 mg daily.  Recent lipids 08/01/2020; TG 106, TC 191, HDL 64, LDL 106.  Medication Adjustments/Labs and Tests Ordered: Current medicines are reviewed at length with the patient today.  Concerns regarding medicines are outlined above.   Disposition: Follow-up with Dr. Diona Browner or APP 6 months  Signed, Rennis Harding, NP 10/29/2020 9:58 AM    [facility] at [facility] [address] [place], [place], [place] [address] Phone: [phone]; Fax: [phone]

## 2020-10-29 ENCOUNTER — Ambulatory Visit: Payer: Medicare HMO | Admitting: Family Medicine

## 2020-10-29 ENCOUNTER — Encounter: Payer: Self-pay | Admitting: Family Medicine

## 2020-10-29 ENCOUNTER — Other Ambulatory Visit: Payer: Self-pay

## 2020-10-29 VITALS — BP 146/82 | HR 72 | Ht 69.0 in | Wt 202.0 lb

## 2020-10-29 DIAGNOSIS — I2510 Atherosclerotic heart disease of native coronary artery without angina pectoris: Secondary | ICD-10-CM | POA: Diagnosis not present

## 2020-10-29 DIAGNOSIS — I495 Sick sinus syndrome: Secondary | ICD-10-CM | POA: Diagnosis not present

## 2020-10-29 DIAGNOSIS — I10 Essential (primary) hypertension: Secondary | ICD-10-CM

## 2020-10-29 DIAGNOSIS — I480 Paroxysmal atrial fibrillation: Secondary | ICD-10-CM | POA: Diagnosis not present

## 2020-10-29 DIAGNOSIS — Z950 Presence of cardiac pacemaker: Secondary | ICD-10-CM

## 2020-10-29 DIAGNOSIS — E782 Mixed hyperlipidemia: Secondary | ICD-10-CM

## 2020-10-29 NOTE — Patient Instructions (Signed)
Medication Instructions:  Your physician recommends that you continue on your current medications as directed. Please refer to the Current Medication list given to you today.  *If you need a refill on your cardiac medications before your next appointment, please call your pharmacy*   Lab Work: NONE   If you have labs (blood work) drawn today and your tests are completely normal, you will receive your results only by: MyChart Message (if you have MyChart) OR A paper copy in the mail If you have any lab test that is abnormal or we need to change your treatment, we will call you to review the results.   Testing/Procedures: NONE    Follow-Up: At [facility], you and your health needs are our priority.  As part of our continuing mission to provide you with exceptional heart care, we have created designated Provider Care Teams.  These Care Teams include your primary Cardiologist (physician) and Advanced Practice Providers (APPs -  Physician Assistants and Nurse Practitioners) who all work together to provide you with the care you need, when you need it.  We recommend signing up for the patient portal called "MyChart".  Sign up information is provided on this After Visit Summary.  MyChart is used to connect with patients for Virtual Visits (Telemedicine).  Patients are able to view lab/test results, encounter notes, upcoming appointments, etc.  Non-urgent messages can be sent to your provider as well.   To learn more about what you can do with MyChart, go to [email or web address].    Your next appointment:   6 month(s)  The format for your next appointment:   In Person  Provider:   Nona Dell, MD or Nena Polio, NP   Other Instructions Thank you for choosing [facility]!

## 2020-12-10 ENCOUNTER — Ambulatory Visit (INDEPENDENT_AMBULATORY_CARE_PROVIDER_SITE_OTHER): Payer: Medicare HMO

## 2020-12-10 DIAGNOSIS — I495 Sick sinus syndrome: Secondary | ICD-10-CM | POA: Diagnosis not present

## 2020-12-11 LAB — CUP PACEART REMOTE DEVICE CHECK
Battery Remaining Longevity: 81 mo
Battery Remaining Percentage: 95.5 %
Battery Voltage: 3.01 V
Brady Statistic AP VP Percent: 1 %
Brady Statistic AP VS Percent: 32 %
Brady Statistic AS VP Percent: 1 %
Brady Statistic AS VS Percent: 67 %
Brady Statistic RA Percent Paced: 28 %
Brady Statistic RV Percent Paced: 3.2 %
Date Time Interrogation Session: 20220906040015
Implantable Lead Implant Date: 20220606
Implantable Lead Implant Date: 20220606
Implantable Lead Location: 753859
Implantable Lead Location: 753860
Implantable Pulse Generator Implant Date: 20220606
Lead Channel Impedance Value: 400 Ohm
Lead Channel Impedance Value: 600 Ohm
Lead Channel Pacing Threshold Amplitude: 0.25 V
Lead Channel Pacing Threshold Amplitude: 0.5 V
Lead Channel Pacing Threshold Pulse Width: 0.5 ms
Lead Channel Pacing Threshold Pulse Width: 0.5 ms
Lead Channel Sensing Intrinsic Amplitude: 12 mV
Lead Channel Sensing Intrinsic Amplitude: 3.3 mV
Lead Channel Setting Pacing Amplitude: 3.5 V
Lead Channel Setting Pacing Amplitude: 3.5 V
Lead Channel Setting Pacing Pulse Width: 0.5 ms
Lead Channel Setting Sensing Sensitivity: 2 mV
Pulse Gen Model: 2272
Pulse Gen Serial Number: 6460902

## 2020-12-19 NOTE — Progress Notes (Signed)
Remote pacemaker transmission.   

## 2020-12-24 ENCOUNTER — Other Ambulatory Visit: Payer: Self-pay

## 2020-12-24 ENCOUNTER — Encounter: Payer: Self-pay | Admitting: Internal Medicine

## 2020-12-24 ENCOUNTER — Ambulatory Visit (INDEPENDENT_AMBULATORY_CARE_PROVIDER_SITE_OTHER): Payer: Medicare HMO | Admitting: Internal Medicine

## 2020-12-24 VITALS — BP 130/82 | HR 72 | Ht 69.0 in | Wt 202.0 lb

## 2020-12-24 DIAGNOSIS — I480 Paroxysmal atrial fibrillation: Secondary | ICD-10-CM

## 2020-12-24 DIAGNOSIS — I495 Sick sinus syndrome: Secondary | ICD-10-CM | POA: Diagnosis not present

## 2020-12-24 NOTE — Patient Instructions (Signed)
Medication Instructions:  Your physician recommends that you continue on your current medications as directed. Please refer to the Current Medication list given to you today.  *If you need a refill on your cardiac medications before your next appointment, please call your pharmacy*   Lab Work: NONE   If you have labs (blood work) drawn today and your tests are completely normal, you will receive your results only by: . MyChart Message (if you have MyChart) OR . A paper copy in the mail If you have any lab test that is abnormal or we need to change your treatment, we will call you to review the results.   Testing/Procedures: NONE    Follow-Up: At [facility], you and your health needs are our priority.  As part of our continuing mission to provide you with exceptional heart care, we have created designated Provider Care Teams.  These Care Teams include your primary Cardiologist (physician) and Advanced Practice Providers (APPs -  Physician Assistants and Nurse Practitioners) who all work together to provide you with the care you need, when you need it.  We recommend signing up for the patient portal called "MyChart".  Sign up information is provided on this After Visit Summary.  MyChart is used to connect with patients for Virtual Visits (Telemedicine).  Patients are able to view lab/test results, encounter notes, upcoming appointments, etc.  Non-urgent messages can be sent to your provider as well.   To learn more about what you can do with MyChart, go to [email or web address].    Your next appointment:   1 year(s)  The format for your next appointment:   In Person  Provider:   Gregg Taylor, MD   Other Instructions Thank you for choosing [facility]!    

## 2020-12-24 NOTE — Progress Notes (Signed)
HPI [name] returns today for followup. 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 In the interim he has undergone  Allergies  Allergen Reactions   Amoxicillin Itching     Current Outpatient Medications  Medication Sig Dispense Refill   amLODipine (NORVASC) 10 MG tablet Take 1 tablet (10 mg total) by mouth daily. 30 tablet 6   apixaban (ELIQUIS) 5 MG TABS tablet Take 1 tablet (5 mg total) by mouth 2 (two) times daily. 180 tablet 2   atorvastatin (LIPITOR) 40 MG tablet Take 40 mg by mouth daily.     losartan (COZAAR) 50 MG tablet Take 50 mg by mouth daily.     nitroGLYCERIN (NITROSTAT) 0.4 MG SL tablet Place 1 tablet (0.4 mg total) under the tongue every 5 (five) minutes x 3 doses as needed for chest pain. 10 tablet 0   No current facility-administered medications for this visit.     Past Medical History:  Diagnosis Date   CAD (coronary artery disease)    DES to mid LAD October 2018   Essential hypertension    NSTEMI (non-ST elevated myocardial infarction) (HCC) 01/2017    ROS:   All systems reviewed and negative except as noted in the HPI.   Past Surgical History:  Procedure Laterality Date   CORONARY STENT INTERVENTION N/A 02/01/2017   Procedure: CORONARY STENT INTERVENTION;  Surgeon: Runell Gess, MD;  [facility];  Service: Cardiovascular;  Laterality: N/A;  LAD Prox-Mid lesion stented   LEFT HEART CATH AND CORONARY ANGIOGRAPHY N/A 02/01/2017   Procedure: LEFT HEART CATH AND CORONARY ANGIOGRAPHY;  Surgeon: Runell Gess, MD;  Location: [facility];  Service: Cardiovascular;  Laterality: N/A;   PACEMAKER IMPLANT N/A 09/09/2020   Procedure: PACEMAKER IMPLANT;  Surgeon: Marinus Maw, MD;  Location: [facility];  Service: Cardiovascular;  Laterality: N/A;     Family History  Problem Relation Age of Onset   Lung cancer Father      Social History   Socioeconomic History    Marital status: Married    Spouse name: Not on file   Number of children: Not on file   Years of education: Not on file   Highest education level: Not on file  Occupational History   Not on file  Tobacco Use   Smoking status: Never   Smokeless tobacco: Never  Vaping Use   Vaping Use: Never used  Substance and Sexual Activity   Alcohol use: Yes    Comment: Occasional   Drug use: No   Sexual activity: Not on file  Other Topics Concern   Not on file  Social History Narrative   Not on file   Social Determinants of Health   Financial Resource Strain: Not on file  Food Insecurity: Not on file  Transportation Needs: Not on file  Physical Activity: Not on file  Stress: Not on file  Social Connections: Not on file  Intimate Partner Violence: Not on file     BP 130/82   Pulse 72   Ht 5\' 9"  (1.753 m)   Wt 202 lb (91.6 kg)   SpO2 96%   BMI 29.83 kg/m   Physical Exam:  Well appearing NAD HEENT: Unremarkable Neck:  No JVD, no thyromegally Lymphatics:  No adenopathy Back:  No CVA tenderness Lungs:  Clear HEART:  Regular rate rhythm, no murmurs, no rubs, no clicks Abd:  soft,  positive bowel sounds, no organomegally, no rebound, no guarding Ext:  2 plus pulses, no edema, no cyanosis, no clubbing Skin:  No rashes no nodules Neuro:  CN II through XII intact, motor grossly intact  EKG  DEVICE  Normal device function.  See PaceArt for details.   Assess/Plan:  Pacemaker - interrogation of his St. Jude DDD PM is working normally. We will recheck in several months. Tachybrady syndrome - he is going out of rhythm about 10% of the time. He is asymptomatic. PAF - interrogation of his PPM demonstrates episodes of atrial fib lasting up to 10 minutes at a time. He will continue eliquis.  CAD -  he is s/p PCI with Dr. Dorma Russell and doing well. No change in meds. HTN - his bp is controlled. Continue current meds.  Sharlot Gowda Edelin Fryer,MD

## 2021-01-01 ENCOUNTER — Other Ambulatory Visit: Payer: Self-pay | Admitting: Cardiology

## 2021-01-01 ENCOUNTER — Other Ambulatory Visit: Payer: Self-pay | Admitting: Family Medicine

## 2021-01-10 ENCOUNTER — Other Ambulatory Visit: Payer: Self-pay | Admitting: Cardiology

## 2021-03-11 ENCOUNTER — Ambulatory Visit (INDEPENDENT_AMBULATORY_CARE_PROVIDER_SITE_OTHER): Payer: Medicare HMO

## 2021-03-11 DIAGNOSIS — I495 Sick sinus syndrome: Secondary | ICD-10-CM | POA: Diagnosis not present

## 2021-03-11 LAB — CUP PACEART REMOTE DEVICE CHECK
Battery Remaining Longevity: 114 mo
Battery Remaining Percentage: 95.5 %
Battery Voltage: 3.02 V
Brady Statistic AP VP Percent: 1 %
Brady Statistic AP VS Percent: 26 %
Brady Statistic AS VP Percent: 1.2 %
Brady Statistic AS VS Percent: 72 %
Brady Statistic RA Percent Paced: 20 %
Brady Statistic RV Percent Paced: 5.7 %
Date Time Interrogation Session: 20221206065623
Implantable Lead Implant Date: 20220606
Implantable Lead Implant Date: 20220606
Implantable Lead Location: 753859
Implantable Lead Location: 753860
Implantable Pulse Generator Implant Date: 20220606
Lead Channel Impedance Value: 430 Ohm
Lead Channel Impedance Value: 560 Ohm
Lead Channel Pacing Threshold Amplitude: 0.5 V
Lead Channel Pacing Threshold Amplitude: 0.5 V
Lead Channel Pacing Threshold Pulse Width: 0.5 ms
Lead Channel Pacing Threshold Pulse Width: 0.5 ms
Lead Channel Sensing Intrinsic Amplitude: 12 mV
Lead Channel Sensing Intrinsic Amplitude: 4.4 mV
Lead Channel Setting Pacing Amplitude: 2 V
Lead Channel Setting Pacing Amplitude: 2.5 V
Lead Channel Setting Pacing Pulse Width: 0.5 ms
Lead Channel Setting Sensing Sensitivity: 2 mV
Pulse Gen Model: 2272
Pulse Gen Serial Number: 6460902

## 2021-03-17 NOTE — Telephone Encounter (Signed)
BMS-PAF eliquis application never picked up by patient to be completed. Will put in shred bin.

## 2021-03-20 NOTE — Progress Notes (Signed)
Remote pacemaker transmission.   

## 2021-03-28 ENCOUNTER — Telehealth: Payer: Self-pay | Admitting: *Deleted

## 2021-03-28 MED ORDER — APIXABAN 5 MG PO TABS: 5.0000 mg | ORAL_TABLET | Freq: Two times a day (BID) | ORAL | 2 refills | Status: DC

## 2021-03-28 MED ORDER — APIXABAN 5 MG PO TABS
5.0000 mg | ORAL_TABLET | Freq: Two times a day (BID) | ORAL | 2 refills | Status: DC
Start: 2021-03-28 — End: 2022-05-20

## 2021-03-28 NOTE — Telephone Encounter (Signed)
Received fax from [facility] [place], [place] - request refill on Eliquis 5mg  twice a day.

## 2021-03-28 NOTE — Addendum Note (Signed)
Addended by: Memory Dance on: 03/28/2021 11:35 AM   Modules accepted: Orders

## 2021-03-28 NOTE — Telephone Encounter (Signed)
Prescription refill request for Eliquis received. Indication:Afib  Last office visit:12/24/20 Scr: 1.23 (08/21/20)  Age: 80 Weight: 91.6kg  Appropriate dose and refill sent to requested pharmacy.

## 2021-04-24 ENCOUNTER — Ambulatory Visit: Payer: Medicare HMO | Admitting: Cardiology

## 2021-06-04 ENCOUNTER — Telehealth: Payer: Self-pay | Admitting: Cardiology

## 2021-06-04 NOTE — Telephone Encounter (Signed)
? ?  Pre-operative Risk Assessment  ?  ?Patient Name: Calvin Cochran  ?DOB: Aug 22, 1940 ?MRN: [id number]  ? ? ? ?Request for Surgical Clearance   ? ?Procedure:  Dental Extraction - Amount of Teeth to be Pulled:  1 ? ?Date of Surgery:  Clearance TBD                              ?   ?Surgeon:  Lincoln Brigham  ?Surgeon's Group or Practice Name:  [facility] ?Phone number:  [phone]  ?Fax number:  [phone] ?  ?Type of Clearance Requested:   ?- Pharmacy:  Hold Apixaban (Eliquis) *We do not suggest the patient hold any medications; this is up to the prescribing doctor ?  ?Type of Anesthesia:   IV sedation with the following medications; Fentanyl, Versed, Zofran, and Decadron  ?  ?Additional requests/questions:  Please advise surgeon/provider what medications should be held. ? ?Signed, ?Vallarie Mare   ?06/04/2021, 3:07 PM   ?

## 2021-06-05 NOTE — Telephone Encounter (Signed)
? ?  Primary Cardiologist: Nona Dell, MD ? ?Chart reviewed as part of pre-operative protocol coverage. Simple dental extractions are considered low risk procedures per guidelines and generally do not require any specific cardiac clearance. It is also generally accepted that for simple extractions and dental cleanings, there is no need to interrupt blood thinner therapy.  ? ?SBE prophylaxis is not required for the patient. ? ?I will route this recommendation to the requesting party via Epic fax function and remove from pre-op pool. ? ?Please call with questions. ? ?Ronney Asters, NP ?06/05/2021, 9:23 AM ? ? ? ? ?

## 2021-07-04 ENCOUNTER — Ambulatory Visit (INDEPENDENT_AMBULATORY_CARE_PROVIDER_SITE_OTHER): Payer: Medicare HMO

## 2021-07-04 DIAGNOSIS — I495 Sick sinus syndrome: Secondary | ICD-10-CM

## 2021-07-04 LAB — CUP PACEART REMOTE DEVICE CHECK
Battery Remaining Longevity: 107 mo
Battery Remaining Percentage: 95 %
Battery Voltage: 3.01 V
Brady Statistic AP VP Percent: 2.6 %
Brady Statistic AP VS Percent: 36 %
Brady Statistic AS VP Percent: 2.7 %
Brady Statistic AS VS Percent: 59 %
Brady Statistic RA Percent Paced: 27 %
Brady Statistic RV Percent Paced: 12 %
Date Time Interrogation Session: 20230330205227
Implantable Lead Implant Date: 20220606
Implantable Lead Implant Date: 20220606
Implantable Lead Location: 753859
Implantable Lead Location: 753860
Implantable Pulse Generator Implant Date: 20220606
Lead Channel Impedance Value: 400 Ohm
Lead Channel Impedance Value: 530 Ohm
Lead Channel Pacing Threshold Amplitude: 0.5 V
Lead Channel Pacing Threshold Amplitude: 0.5 V
Lead Channel Pacing Threshold Pulse Width: 0.5 ms
Lead Channel Pacing Threshold Pulse Width: 0.5 ms
Lead Channel Sensing Intrinsic Amplitude: 12 mV
Lead Channel Sensing Intrinsic Amplitude: 4.2 mV
Lead Channel Setting Pacing Amplitude: 2 V
Lead Channel Setting Pacing Amplitude: 2.5 V
Lead Channel Setting Pacing Pulse Width: 0.5 ms
Lead Channel Setting Sensing Sensitivity: 2 mV
Pulse Gen Model: 2272
Pulse Gen Serial Number: 6460902

## 2021-07-10 ENCOUNTER — Ambulatory Visit: Payer: Medicare HMO | Admitting: Cardiology

## 2021-07-10 ENCOUNTER — Encounter: Payer: Self-pay | Admitting: Cardiology

## 2021-07-10 VITALS — BP 138/80 | HR 75 | Ht 69.0 in | Wt 199.4 lb

## 2021-07-10 DIAGNOSIS — I495 Sick sinus syndrome: Secondary | ICD-10-CM

## 2021-07-10 DIAGNOSIS — I480 Paroxysmal atrial fibrillation: Secondary | ICD-10-CM

## 2021-07-10 DIAGNOSIS — Z79899 Other long term (current) drug therapy: Secondary | ICD-10-CM

## 2021-07-10 DIAGNOSIS — R0602 Shortness of breath: Secondary | ICD-10-CM

## 2021-07-10 DIAGNOSIS — E782 Mixed hyperlipidemia: Secondary | ICD-10-CM

## 2021-07-10 MED ORDER — METOPROLOL SUCCINATE ER 25 MG PO TB24: 12.5000 mg | ORAL_TABLET | Freq: Every day | ORAL | 1 refills | Status: DC

## 2021-07-10 NOTE — Progress Notes (Signed)
? ? ?Cardiology Office Note ? ?Date: 07/10/2021  ? ?ID: Calvin Cochran, DOB [birth year], MRN [id number] ? ?PCP:  System, Provider Not In  ?Cardiologist:  Nona Dell, MD ?Electrophysiologist:  None  ? ?Chief Complaint  ?Patient presents with  ? Cardiac follow-up  ? ? ?History of Present Illness: ?Calvin Cochran is a 81 y.o. male last seen in July 2022 by Mr. Vincenza Hews NP.  He is here for a follow-up visit.  He has felt more short of breath with activity, also having a lot of chronic back pain however.  Reports some feelings of palpitations and orthopnea at nighttime. ? ?St. Jude dual-chamber pacemaker in place per Dr. Ladona Ridgel with device implantation in June 2022 for tachycardia-bradycardia syndrome recent device interrogation revealed normal function with approximately 28% AF burden. ? ?We discussed follow-up lab work on [employer].  I also went over his medications and we discussed addition of low-dose Toprol-XL. ? ?I personally reviewed his ECG today which shows sinus rhythm with PAC and a few atrial paced beats, nonspecific ST changes. ? ?Past Medical History:  ?Diagnosis Date  ? CAD (coronary artery disease)   ? DES to mid LAD October 2018  ? Essential hypertension   ? NSTEMI (non-ST elevated myocardial infarction) (HCC) 01/2017  ? Paroxysmal atrial fibrillation (HCC)   ? Tachycardia-bradycardia syndrome (HCC)   ? 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 Ladona Ridgel  ? ? ?Past Surgical History:  ?Procedure Laterality Date  ? CORONARY STENT INTERVENTION N/A 02/01/2017  ? Procedure: CORONARY STENT INTERVENTION;  Surgeon: Runell Gess, MD;  [facility]: [facility] INVASIVE CV LAB;  Service: Cardiovascular;  Laterality: N/A;  LAD Prox-Mid lesion stented  ? LEFT HEART CATH AND CORONARY ANGIOGRAPHY N/A 02/01/2017  ? Procedure: LEFT HEART CATH AND CORONARY ANGIOGRAPHY;  Surgeon: Runell Gess, MD;  [facility]: [facility];  Service: Cardiovascular;  Laterality: N/A;  ? PACEMAKER IMPLANT N/A 09/09/2020  ? Procedure: PACEMAKER IMPLANT;   Surgeon: Marinus Maw, MD;  Location: [facility] INVASIVE CV LAB;  Service: Cardiovascular;  Laterality: N/A;  ? ? ?Current Outpatient Medications  ?Medication Sig Dispense Refill  ? amLODipine (NORVASC) 10 MG tablet TAKE 1 TABLET BY MOUTH EVERY DAY 90 tablet 2  ? apixaban (ELIQUIS) 5 MG TABS tablet Take 1 tablet (5 mg total) by mouth 2 (two) times daily. 180 tablet 2  ? atorvastatin (LIPITOR) 40 MG tablet TAKE 1 TABLET BY MOUTH DAILY 90 tablet 2  ? losartan (COZAAR) 50 MG tablet TAKE 1 TABLET BY MOUTH DAILY 90 tablet 2  ? metoprolol succinate (TOPROL XL) 25 MG 24 hr tablet Take 0.5 tablets (12.5 mg total) by mouth daily. 45 tablet 1  ? nitroGLYCERIN (NITROSTAT) 0.4 MG SL tablet Place 1 tablet (0.4 mg total) under the tongue every 5 (five) minutes x 3 doses as needed for chest pain. 10 tablet 0  ? ?No current facility-administered medications for this visit.  ? ?Allergies:  Amoxicillin  ? ?ROS: No syncope. ? ?Physical Exam: ?VS:  BP 138/80   Pulse 75   Ht 5\' 9"  (1.753 m)   Wt 199 lb 6.4 oz (90.4 kg)   SpO2 98%   BMI 29.45 kg/m? , BMI Body mass index is 29.45 kg/m?. ? ?Wt Readings from Last 3 Encounters:  ?07/10/21 199 lb 6.4 oz (90.4 kg)  ?12/24/20 202 lb (91.6 kg)  ?10/29/20 202 lb (91.6 kg)  ?  ?General: Patient appears comfortable at rest. ?HEENT: Conjunctiva and lids normal. ?Neck: Supple, no elevated JVP or carotid  bruits, no thyromegaly. ?Lungs: Clear to auscultation, nonlabored breathing at rest. ?Cardiac: Regular rate and rhythm, no S3, 2/6 systolic murmur, no pericardial rub. ?Extremities: No pitting edema. ? ?ECG:  An ECG dated 06/26/2020 was personally reviewed today and demonstrated:  Sinus rhythm. ? ?Recent Labwork: ?08/21/2020: BUN 28; Creatinine, Ser 1.23; Hemoglobin 14.0; Platelets 214; Potassium 4.4; Sodium 138  ?   ?Component Value Date/Time  ? CHOL 193 02/01/2017 0758  ? TRIG 115 02/01/2017 0758  ? HDL 43 02/01/2017 0758  ? CHOLHDL 4.5 02/01/2017 0758  ? VLDL 23 02/01/2017 0758  ? LDLCALC 127 (H)  02/01/2017 0758  ? ? ?Other Studies Reviewed Today: ? ?Echocardiogram 07/16/2020: ? 1. Left ventricular ejection fraction, by estimation, is 60 to 65%. The  ?left ventricle has normal function. The left ventricle has no regional  ?wall motion abnormalities. There is mild left ventricular hypertrophy.  ?Left ventricular diastolic parameters  ?are indeterminate. The average left ventricular global longitudinal strain  ?is 17.1 %. The global longitudinal strain is normal.  ? 2. Right ventricular systolic function is normal. The right ventricular  ?size is normal.  ? 3. The mitral valve is normal in structure. Trivial mitral valve  ?regurgitation. No evidence of mitral stenosis.  ? 4. The aortic valve is tricuspid. Aortic valve regurgitation is mild to  ?moderate.  ? ?Assessment and Plan: ? ?1.  Paroxysmal atrial fibrillation with CHA2DS2-VASc score of 3.  He is on Eliquis for stroke prophylaxis, due for follow-up lab work which will be arranged, check CBC and BMET.  Device interrogation shows 28% AF burden.  Plan to add Toprol-XL 12.5 mg daily for now.  Continue observation. ? ?2.  Worsening shortness of breath.  We will update his echocardiogram, LVEF previously 60 to 65% in April of last year. ? ?3.  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 Ladona Ridgel. ? ?4.  CAD status post DES to the LAD in 2018.  May need to consider ischemic work-up, will review echocardiogram first. ? ? ?Medication Adjustments/Labs and Tests Ordered: ?Current medicines are reviewed at length with the patient today.  Concerns regarding medicines are outlined above.  ? ?Tests Ordered: ?Orders Placed This Encounter  ?Procedures  ? CBC  ? Basic metabolic panel  ? EKG 12-Lead  ? ECHOCARDIOGRAM COMPLETE  ? ? ?Medication Changes: ?Meds ordered this encounter  ?Medications  ? metoprolol succinate (TOPROL XL) 25 MG 24 hr tablet  ?  Sig: Take 0.5 tablets (12.5 mg total) by mouth daily.  ?  Dispense:  45 tablet  ?   Refill:  1  ?  07/10/2021 NEW  ? ? ?Disposition:  Follow up  6 months. ? ?Signed, ?Jonelle Sidle, MD, [facility] ?07/10/2021 4:20 PM    ?[facility] ?[address] [place], [place], [place] [address] ?Phone: [phone]; Fax: [phone]  ?

## 2021-07-10 NOTE — Patient Instructions (Addendum)
Medication Instructions:  ?Your physician has recommended you make the following change in your medication:  ?Start Toprol XL 12.5 mg daily ?Continue other medications the same ? ?Labwork: ?BMET & CBC within 1 week ?Non-fasting ? ?Testing/Procedures: ?Your physician has requested that you have an echocardiogram. Echocardiography is a painless test that uses sound waves to create images of your heart. It provides your doctor with information about the size and shape of your heart and how well your heart?s chambers and valves are working. This procedure takes approximately one hour. There are no restrictions for this procedure. ? ?Follow-Up: ?Your physician recommends that you schedule a follow-up appointment in: 6 months ? ?Any Other Special Instructions Will Be Listed Below (If Applicable). ? ?If you need a refill on your cardiac medications before your next appointment, please call your pharmacy. ?

## 2021-07-15 ENCOUNTER — Encounter: Payer: Self-pay | Admitting: *Deleted

## 2021-07-16 ENCOUNTER — Telehealth: Payer: Self-pay | Admitting: *Deleted

## 2021-07-16 ENCOUNTER — Other Ambulatory Visit: Payer: Self-pay | Admitting: *Deleted

## 2021-07-16 DIAGNOSIS — E876 Hypokalemia: Secondary | ICD-10-CM

## 2021-07-16 DIAGNOSIS — Z79899 Other long term (current) drug therapy: Secondary | ICD-10-CM

## 2021-07-16 NOTE — Telephone Encounter (Signed)
Patient informed and verbalized understanding of plan. ?Lab order mailed to home address ?Will have lab done at [facility] ?

## 2021-07-16 NOTE — Telephone Encounter (Signed)
-----   Message from Ellsworth Lennox, [place] sent at 07/16/2021  7:45 AM EDT ----- ?Covering for [employer] - Please let the patient know his hemoglobin and platelets are within a normal range. Kidney function remains stable with creatinine at 1.22. His potassium was slightly low at 3.3. He is not currently on a diuretic. Would recommend increasing his intake of K+ rich foods (bananas, greens, tomatoes, etc.) and rechecking a BMET in 2-3 weeks for reassessment. If this remains low, may need to add a low-dose K+ supplement.  ?

## 2021-07-16 NOTE — Progress Notes (Signed)
Remote pacemaker transmission.   

## 2021-08-01 ENCOUNTER — Encounter: Payer: Self-pay | Admitting: Cardiology

## 2021-08-12 ENCOUNTER — Ambulatory Visit (INDEPENDENT_AMBULATORY_CARE_PROVIDER_SITE_OTHER): Payer: Medicare HMO

## 2021-08-12 DIAGNOSIS — R0602 Shortness of breath: Secondary | ICD-10-CM | POA: Diagnosis not present

## 2021-08-12 LAB — ECHOCARDIOGRAM COMPLETE
AR max vel: 2.33 cm2
AV Area VTI: 2.73 cm2
AV Area mean vel: 2.12 cm2
AV Mean grad: 4 mmHg
AV Peak grad: 7.7 mmHg
AV Vena cont: 0.24 cm
Ao pk vel: 1.39 m/s
Area-P 1/2: 3.3 cm2
Calc EF: 67.9 %
P 1/2 time: 917 msec
S' Lateral: 2.91 cm
Single Plane A2C EF: 72.1 %
Single Plane A4C EF: 61 %

## 2021-09-30 ENCOUNTER — Other Ambulatory Visit: Payer: Self-pay | Admitting: *Deleted

## 2021-09-30 MED ORDER — LOSARTAN POTASSIUM 50 MG PO TABS: 50.0000 mg | ORAL_TABLET | Freq: Every day | ORAL | 2 refills | Status: DC

## 2021-09-30 MED ORDER — ATORVASTATIN CALCIUM 40 MG PO TABS: 40.0000 mg | ORAL_TABLET | Freq: Every day | ORAL | 2 refills | Status: DC

## 2021-10-03 ENCOUNTER — Ambulatory Visit (INDEPENDENT_AMBULATORY_CARE_PROVIDER_SITE_OTHER): Payer: Medicare HMO

## 2021-10-03 DIAGNOSIS — I495 Sick sinus syndrome: Secondary | ICD-10-CM | POA: Diagnosis not present

## 2021-10-03 LAB — CUP PACEART REMOTE DEVICE CHECK
Battery Remaining Longevity: 103 mo
Battery Remaining Percentage: 92 %
Battery Voltage: 3.01 V
Brady Statistic AP VP Percent: 2.4 %
Brady Statistic AP VS Percent: 37 %
Brady Statistic AS VP Percent: 2.6 %
Brady Statistic AS VS Percent: 58 %
Brady Statistic RA Percent Paced: 29 %
Brady Statistic RV Percent Paced: 12 %
Date Time Interrogation Session: 20230630020030
Implantable Lead Implant Date: 20220606
Implantable Lead Implant Date: 20220606
Implantable Lead Location: 753859
Implantable Lead Location: 753860
Implantable Pulse Generator Implant Date: 20220606
Lead Channel Impedance Value: 410 Ohm
Lead Channel Impedance Value: 480 Ohm
Lead Channel Pacing Threshold Amplitude: 0.5 V
Lead Channel Pacing Threshold Amplitude: 0.5 V
Lead Channel Pacing Threshold Pulse Width: 0.5 ms
Lead Channel Pacing Threshold Pulse Width: 0.5 ms
Lead Channel Sensing Intrinsic Amplitude: 12 mV
Lead Channel Sensing Intrinsic Amplitude: 4.4 mV
Lead Channel Setting Pacing Amplitude: 2 V
Lead Channel Setting Pacing Amplitude: 2.5 V
Lead Channel Setting Pacing Pulse Width: 0.5 ms
Lead Channel Setting Sensing Sensitivity: 2 mV
Pulse Gen Model: 2272
Pulse Gen Serial Number: 6460902

## 2021-10-16 NOTE — Progress Notes (Signed)
Remote pacemaker transmission.   

## 2021-10-17 ENCOUNTER — Other Ambulatory Visit: Payer: Self-pay | Admitting: Cardiology

## 2021-12-03 ENCOUNTER — Telehealth: Payer: Self-pay | Admitting: Cardiology

## 2021-12-03 NOTE — Telephone Encounter (Signed)
   Pre-operative Risk Assessment    Patient Name: Calvin Cochran  DOB: [birth year] MRN: [id number]     Request for Surgical Clearance    Procedure:   IMPLANT PLACEMENT AND GUM FLAP  Date of Surgery:  Clearance TBD                                 Surgeon:  J. PEYTON MOORE Surgeon's Group or Practice Name:  [facility], DDS [facility] Phone number:  [phone] Fax number:  [phone]   Type of Clearance Requested:   Pharmacy- Eliquis    Type of Anesthesia:  Not Indicated   Additional requests/questions:  Please advise surgeon/provider what medications should be held.  Signed, Vallarie Mare   12/03/2021, 9:25 AM

## 2021-12-03 NOTE — Telephone Encounter (Signed)
Will route to PharmD for rec's re: holding anticoagulation. Tereso Newcomer, PA-C    12/03/2021 5:10 PM

## 2021-12-06 NOTE — Telephone Encounter (Signed)
Patient with diagnosis of atrial fibrillation on Eliquis for anticoagulation.    Procedure: implant placement and gum flap Date of procedure: TBD   CHA2DS2-VASc Score = 4   This indicates a 4.8% annual risk of stroke. [name] score is based upon: CHF History: 0 HTN History: 1 Diabetes History: 0 Stroke History: 0 Vascular Disease History: 1 Age Score: 2 Gender Score: 0   CrCl 68 Platelet count 247  Patient does not require pre-op antibiotics for dental procedure.  Per office protocol, patient can hold Eliquis for 2 days prior to procedure.   Patient will not need bridging with Lovenox (enoxaparin) around procedure.  **This guidance is not considered finalized until pre-operative APP has relayed final recommendations.**

## 2021-12-11 NOTE — Telephone Encounter (Signed)
   Primary Cardiologist: Nona Dell, MD  Chart reviewed as part of pre-operative protocol coverage. *Please note, this is not a medical cardiac clearance, it only addresses anticoagulation per your request.   Patient does not require pre-op antibiotics for dental procedure.   Per office protocol, patient can hold Eliquis for 2 days prior to procedure.   Patient will not need bridging with Lovenox (enoxaparin) around procedure.  I will route this recommendation to the requesting party via Epic fax function and remove from pre-op pool.  Please call with questions.  Levi Aland, NP-C     12/11/2021, 2:53 PM [address], [address] Office [phone] Fax [phone]

## 2022-01-02 ENCOUNTER — Ambulatory Visit (INDEPENDENT_AMBULATORY_CARE_PROVIDER_SITE_OTHER): Payer: Medicare HMO

## 2022-01-02 DIAGNOSIS — I495 Sick sinus syndrome: Secondary | ICD-10-CM | POA: Diagnosis not present

## 2022-01-02 LAB — CUP PACEART REMOTE DEVICE CHECK
Battery Remaining Longevity: 98 mo
Battery Remaining Percentage: 90 %
Battery Voltage: 3.01 V
Brady Statistic AP VP Percent: 2.1 %
Brady Statistic AP VS Percent: 44 %
Brady Statistic AS VP Percent: 2.1 %
Brady Statistic AS VS Percent: 52 %
Brady Statistic RA Percent Paced: 36 %
Brady Statistic RV Percent Paced: 11 %
Date Time Interrogation Session: 20230929020028
Implantable Lead Implant Date: 20220606
Implantable Lead Implant Date: 20220606
Implantable Lead Location: 753859
Implantable Lead Location: 753860
Implantable Pulse Generator Implant Date: 20220606
Lead Channel Impedance Value: 380 Ohm
Lead Channel Impedance Value: 430 Ohm
Lead Channel Pacing Threshold Amplitude: 0.5 V
Lead Channel Pacing Threshold Amplitude: 0.5 V
Lead Channel Pacing Threshold Pulse Width: 0.5 ms
Lead Channel Pacing Threshold Pulse Width: 0.5 ms
Lead Channel Sensing Intrinsic Amplitude: 12 mV
Lead Channel Sensing Intrinsic Amplitude: 3.5 mV
Lead Channel Setting Pacing Amplitude: 2 V
Lead Channel Setting Pacing Amplitude: 2.5 V
Lead Channel Setting Pacing Pulse Width: 0.5 ms
Lead Channel Setting Sensing Sensitivity: 2 mV
Pulse Gen Model: 2272
Pulse Gen Serial Number: 6460902

## 2022-01-04 ENCOUNTER — Other Ambulatory Visit: Payer: Self-pay | Admitting: Cardiology

## 2022-01-06 ENCOUNTER — Telehealth: Payer: Self-pay | Admitting: *Deleted

## 2022-01-06 ENCOUNTER — Ambulatory Visit: Payer: Medicare HMO | Attending: Internal Medicine | Admitting: Internal Medicine

## 2022-01-06 ENCOUNTER — Encounter: Payer: Self-pay | Admitting: Internal Medicine

## 2022-01-06 VITALS — BP 122/64 | HR 70 | Ht 69.0 in | Wt 206.0 lb

## 2022-01-06 DIAGNOSIS — I10 Essential (primary) hypertension: Secondary | ICD-10-CM | POA: Diagnosis not present

## 2022-01-06 DIAGNOSIS — R0683 Snoring: Secondary | ICD-10-CM

## 2022-01-06 DIAGNOSIS — I495 Sick sinus syndrome: Secondary | ICD-10-CM | POA: Diagnosis not present

## 2022-01-06 MED ORDER — FUROSEMIDE 20 MG PO TABS: 20.0000 mg | ORAL_TABLET | Freq: Every day | ORAL | 3 refills | Status: DC

## 2022-01-06 NOTE — Telephone Encounter (Signed)
PLEASE DISREGARD THE NOTES ABOUT ITAMAR SLEEP STUDY, (these notes should had been in a separate phone note).      In regard the pre op clearance see notes from Christen Bame, NP.

## 2022-01-06 NOTE — Progress Notes (Signed)
HPI Calvin Cochran returns today for followup. 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 In the interim he has had some sob and he notes that his wife states that he snores loudly and at times stops breathing. He is pending a possible spinal injection. He does not have palpitations. He is out of rhythm about 20% of the time.  Allergies  Allergen Reactions   Amoxicillin Itching     Current Outpatient Medications  Medication Sig Dispense Refill   amLODipine (NORVASC) 10 MG tablet TAKE 1 TABLET BY MOUTH EVERY DAY 90 tablet 2   apixaban (ELIQUIS) 5 MG TABS tablet Take 1 tablet (5 mg total) by mouth 2 (two) times daily. 180 tablet 2   atorvastatin (LIPITOR) 40 MG tablet Take 1 tablet (40 mg total) by mouth daily. 90 tablet 2   losartan (COZAAR) 50 MG tablet Take 1 tablet (50 mg total) by mouth daily. 90 tablet 2   metoprolol succinate (TOPROL-XL) 25 MG 24 hr tablet TAKE 1/2 TABLET BY MOUTH EVERY DAY 45 tablet 1   nitroGLYCERIN (NITROSTAT) 0.4 MG SL tablet Place 1 tablet (0.4 mg total) under the tongue every 5 (five) minutes x 3 doses as needed for chest pain. 10 tablet 0   No current facility-administered medications for this visit.     Past Medical History:  Diagnosis Date   CAD (coronary artery disease)    DES to mid LAD October 2018   Essential hypertension    NSTEMI (non-ST elevated myocardial infarction) ([place]) 01/2017   Paroxysmal atrial fibrillation (HCC)    Tachycardia-bradycardia syndrome [facility])    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 Lovena Le    ROS:   All systems reviewed and negative except as noted in the HPI.   Past Surgical History:  Procedure Laterality Date   CORONARY STENT INTERVENTION N/A 02/01/2017   Procedure: CORONARY STENT INTERVENTION;  Surgeon: Lorretta Harp, MD;  Location: [place] CV LAB;  Service: Cardiovascular;  Laterality: N/A;  LAD Prox-Mid lesion stented   LEFT HEART CATH AND CORONARY ANGIOGRAPHY  N/A 02/01/2017   Procedure: LEFT HEART CATH AND CORONARY ANGIOGRAPHY;  Surgeon: Lorretta Harp, MD;  Location: [place] [facility] LAB;  Service: Cardiovascular;  Laterality: N/A;   PACEMAKER IMPLANT N/A 09/09/2020   Procedure: PACEMAKER IMPLANT;  Surgeon: Evans Lance, MD;  Location: [place] [facility];  Service: Cardiovascular;  Laterality: N/A;     Family History  Problem Relation Age of Onset   Lung cancer Father      Social History   Socioeconomic History   Marital status: Married    Spouse name: Not on file   Number of children: Not on file   Years of education: Not on file   Highest education level: Not on file  Occupational History   Not on file  Tobacco Use   Smoking status: Never   Smokeless tobacco: Never  Vaping Use   Vaping Use: Never used  Substance and Sexual Activity   Alcohol use: Yes    Comment: Occasional   Drug use: No   Sexual activity: Not on file  Other Topics Concern   Not on file  Social History Narrative   Not on file   Social Determinants of Health   Financial Resource Strain: Not on file  Food Insecurity: Not on file  Transportation Needs: Not on file  Physical Activity: Not on file  Stress: Not on  file  Social Connections: Not on file  Intimate Partner Violence: Not on file     BP 122/64   Pulse 70   Ht 5\' 9"  (1.753 m)   Wt 206 lb (93.4 kg)   SpO2 98%   BMI 30.42 kg/m   Physical Exam:  Well appearing NAD HEENT: Unremarkable Neck:  No JVD, no thyromegally Lymphatics:  No adenopathy Back:  No CVA tenderness Lungs:  Clear with no wheezes HEART:  Regular rate rhythm, no murmurs, no rubs, no clicks Abd:  soft, positive bowel sounds, no organomegally, no rebound, no guarding Ext:  2 plus pulses, no edema, no cyanosis, no clubbing Skin:  No rashes no nodules Neuro:  CN II through XII intact, motor grossly intact  DEVICE  Normal device function.  See PaceArt for details.   Assess/Plan:   Pacemaker - interrogation of his  [employer] DDD PM is working normally. We will recheck in several months. Tachybrady syndrome - he is going out of rhythm about 20% of the time. He is asymptomatic. PAF - interrogation of his PPM demonstrates episodes of atrial fib lasting over an hour at a time. He will continue eliquis.  CAD -  he is s/p PCI with Dr. Hiram Comber and doing well. No change in meds. Dyspnea - I have asked him to start lasix 20 mg daily.  Possible sleep apnea - he snores and he tells me his wife says he stops breathing when he sleeps at night. We will check a home sleep study.  Calvin Peru, MD

## 2022-01-06 NOTE — Telephone Encounter (Signed)
   Pre-operative Risk Assessment    Patient Name: Calvin Cochran  DOB: 20-Sep-1940 MRN: [id number]      Request for Surgical Clearance    Procedure:   B/L L4-5 TFESI  Date of Surgery:  Clearance 02/10/22                                 Surgeon:  DR. O'TOOLE Surgeon's Group or Practice Name:  [facility] Phone number:  [phone] Fax number:  [phone]   Type of Clearance Requested:   - Medical  - Pharmacy:  Hold Apixaban (Eliquis) x 3 DAYS PRIOR   Type of Anesthesia: NOT INDICATED ( LOCAL ?)   Additional requests/questions:    Jiles Prows   01/06/2022, 11:48 AM

## 2022-01-06 NOTE — Telephone Encounter (Signed)
Freada Bergeron, CMA  You 23 minutes ago (4:07 PM)    Ready No PA Req    Freada Bergeron, CMA 23 minutes ago (4:06 PM)    Prior Authorization for [employer] sent to [employer] via web portal. Tracking Number . NO PA REQ.       Note

## 2022-01-06 NOTE — Telephone Encounter (Signed)
Prior Authorization for [employer] sent to [employer] via web portal. Tracking Number . NO PA REQ.

## 2022-01-06 NOTE — Telephone Encounter (Signed)
Patient had an office visit today, 01/06/22, with Dr. Lovena Le for device check. He has an upcoming appointment with Dr. Domenic Polite on 01/28/22 at which time cardiac clearance for procedure on 11/7 can be addressed. Additionally, clearance request has been faxed to pharmacy for recommendation regarding holding Eliquis.  Preop callback staff, please update patient and requesting provider.  Thank you Emmaline Life, NP-C  01/06/2022, 12:11 PM [address], [address] Office [phone] Fax [phone]

## 2022-01-06 NOTE — Patient Instructions (Signed)
Medication Instructions:   Start Lasix 20 mg Daily   *If you need a refill on your cardiac medications before your next appointment, please call your pharmacy*   Lab Work: Your physician recommends that you return for lab work in: 2 Weeks ( BMP)   If you have labs (blood work) drawn today and your tests are completely normal, you will receive your results only by: [place] (if you have MyChart) OR A paper copy in the mail If you have any lab test that is abnormal or we need to change your treatment, we will call you to review the results.   Testing/Procedures: Your physician has recommended that you have a sleep study. This test records several body functions during sleep, including: brain activity, eye movement, oxygen and carbon dioxide blood levels, heart rate and rhythm, breathing rate and rhythm, the flow of air through your mouth and nose, snoring, body muscle movements, and chest and belly movement.    Follow-Up: At [facility], you and your health needs are our priority.  As part of our continuing mission to provide you with exceptional heart care, we have created designated Provider Care Teams.  These Care Teams include your primary Cardiologist (physician) and Advanced Practice Providers (APPs -  Physician Assistants and Nurse Practitioners) who all work together to provide you with the care you need, when you need it.  We recommend signing up for the patient portal called "MyChart".  Sign up information is provided on this After Visit Summary.  MyChart is used to connect with patients for Virtual Visits (Telemedicine).  Patients are able to view lab/test results, encounter notes, upcoming appointments, etc.  Non-urgent messages can be sent to your provider as well.   To learn more about what you can do with MyChart, go to [email or web address].    Your next appointment:   3 -4 month(s)  The format for your next appointment:   In Person  Provider:    Cristopher Peru, MD    Other Instructions Thank you for choosing [facility]!    Important Information About [place]

## 2022-01-07 NOTE — Telephone Encounter (Signed)
Called and notified pt via private voicemail that he can do sleep study. Pt given pin for sleep study and informed that he could call office with any questions.

## 2022-01-07 NOTE — Progress Notes (Signed)
Remote pacemaker transmission.   

## 2022-01-08 NOTE — Telephone Encounter (Signed)
Patient with diagnosis of atrial fibrillation on Eliquis for anticoagulation.    Procedure: B/L L4-5 TFESI  Date of procedure: 02/10/22   CHA2DS2-VASc Score = 4   This indicates a 4.8% annual risk of stroke. [name] score is based upon: CHF History: 0 HTN History: 1 Diabetes History: 0 Stroke History: 0 Vascular Disease History: 1 Age Score: 2 Gender Score: 0    CrCl 71 Platelet count 247  Per office protocol, patient can hold Eliquis for 3 days prior to procedure.   Patient will not need bridging with Lovenox (enoxaparin) around procedure.  **This guidance is not considered finalized until pre-operative APP has relayed final recommendations.**

## 2022-01-12 ENCOUNTER — Telehealth: Payer: Self-pay | Admitting: Internal Medicine

## 2022-01-12 NOTE — Telephone Encounter (Signed)
Pt would like a callback in regards to [facility]. Pt states that is will not connect to his phone. Please advise

## 2022-01-12 NOTE — Telephone Encounter (Signed)
Will forward to device for directions regarding watch app.

## 2022-01-13 ENCOUNTER — Encounter: Payer: Self-pay | Admitting: Internal Medicine

## 2022-01-14 ENCOUNTER — Encounter: Payer: Self-pay | Admitting: Internal Medicine

## 2022-01-16 ENCOUNTER — Encounter: Payer: Self-pay | Admitting: Internal Medicine

## 2022-01-19 ENCOUNTER — Other Ambulatory Visit (HOSPITAL_COMMUNITY)
Admission: RE | Admit: 2022-01-19 | Discharge: 2022-01-19 | Disposition: A | Discharge status: Home / Self Care | Payer: Medicare HMO | Source: Ambulatory Visit | Attending: Internal Medicine | Admitting: Internal Medicine

## 2022-01-19 ENCOUNTER — Encounter (HOSPITAL_BASED_OUTPATIENT_CLINIC_OR_DEPARTMENT_OTHER): Payer: Medicare HMO | Admitting: Cardiology

## 2022-01-19 DIAGNOSIS — G4733 Obstructive sleep apnea (adult) (pediatric): Secondary | ICD-10-CM

## 2022-01-19 DIAGNOSIS — I10 Essential (primary) hypertension: Secondary | ICD-10-CM | POA: Diagnosis present

## 2022-01-19 DIAGNOSIS — G4734 Idiopathic sleep related nonobstructive alveolar hypoventilation: Secondary | ICD-10-CM | POA: Diagnosis not present

## 2022-01-19 LAB — BASIC METABOLIC PANEL
Anion gap: 10 (ref 5–15)
BUN: 21 mg/dL (ref 8–23)
CO2: 26 mmol/L (ref 22–32)
Calcium: 9.2 mg/dL (ref 8.9–10.3)
Chloride: 104 mmol/L (ref 98–111)
Creatinine, Ser: 1.36 mg/dL — ABNORMAL HIGH (ref 0.61–1.24)
GFR, Estimated: 53 mL/min — ABNORMAL LOW (ref >60)
Glucose, Bld: 112 mg/dL — ABNORMAL HIGH (ref 70–99)
Potassium: 3.6 mmol/L (ref 3.5–5.1)
Sodium: 140 mmol/L (ref 135–145)

## 2022-01-25 NOTE — Procedures (Signed)
Patient Information Study Date: 01/19/22 Patient Name: Calvin Cochran Patient ID: [id number] Birth Date: 08/02/2040 Age: 81 Gender: Male BMI: 30.7 (W=207 lb, H=5' 9'') Referring Physician: Fransico Him, MD  TEST DESCRIPTION: Home sleep apnea testing was completed using the WatchPat, a Type 1 device, utilizing peripheral arterial tonometry (PAT), chest movement, actigraphy, pulse oximetry, pulse rate, body position and snore. AHI was calculated with apnea and hypopnea using valid sleep time as the denominator. RDI includes apneas, hypopneas, and RERAs. The data acquired and the scoring of sleep and all associated events were performed in accordance with the recommended standards and specifications as outlined in the AASM Manual for the Scoring of Sleep and Associated Events 2.2.0 (2015).   FINDINGS:   1. Mild Obstructive Sleep Apnea with AHI 10.2/hr.   2. No Central Sleep Apnea with pAHIc 0.9/hr.   3. Oxygen desaturations as low as 76%.   4. Mild snoring was present. O2 sats were < 88% for 9.2 min.   5. Total sleep time was 7 hrs and 33 min.   6. 25% of total sleep time was spent in REM sleep.   7. Shortened sleep onset latency at 6 min.   8. Prolonged REM sleep onset latency at 188 min.   9. Total awakenings were 12.  10. Arrhythmia detection:  Suggestive of possible brief atrial fibrillation lasting 56 seconds.  This is not diagnostic and further testing with outpatient telemetry monitoring is recommended.  DIAGNOSIS: Mild Obstructive Sleep Apnea (G47.33) Nocturnal Hypoxemia Possible Atrial Fibrillation  RECOMMENDATIONS:   1.  Clinical correlation of these findings is necessary.  The decision to treat obstructive sleep apnea (OSA) is usually based on the presence of apnea symptoms or the presence of associated medical conditions such as Hypertension, Congestive Heart Failure, Atrial Fibrillation or Obesity.  The most common symptoms of OSA are snoring, gasping for breath while  sleeping, daytime sleepiness and fatigue.   2.  Initiating apnea therapy is recommended given the presence of symptoms and/or associated conditions. Recommend proceeding with one of the following:     a.  Auto-CPAP therapy with a pressure range of 5-20cm H2O.     b.  An oral appliance (OA) that can be obtained from certain dentists with expertise in sleep medicine.  These are primarily of use in non-obese patients with mild and moderate disease.     c.  An ENT consultation which may be useful to look for specific causes of obstruction and possible treatment options.     d.  If patient is intolerant to PAP therapy, consider referral to ENT for evaluation for hypoglossal nerve stimulator.   3.  Close follow-up is necessary to ensure success with CPAP or oral appliance therapy for maximum benefit.  4.  A follow-up oximetry study on CPAP is recommended to assess the adequacy of therapy and determine the need for supplemental oxygen or the potential need for Bi-level therapy.  An arterial blood gas to determine the adequacy of baseline ventilation and oxygenation should also be considered.  5.  Healthy sleep recommendations include:  adequate nightly sleep (normal 7-9 hrs/night), avoidance of caffeine after noon and alcohol near bedtime, and maintaining a sleep environment that is cool, dark and quiet.  6.  Weight loss for overweight patients is recommended.  Even modest amounts of weight loss can significantly improve the severity of sleep apnea.  7.  Snoring recommendations include:  weight loss where appropriate, side sleeping, and avoidance of alcohol before bed.  8.  Operation of motor vehicle should be avoided when sleepy.  9.  Consider outpatient event monitor to assess for atrial arrhythmias if clinically indicated.   Signature: Fransico Him, MD; [facility]; [place], [place] Board of Sleep Medicine Electronically Signed: 01/25/22

## 2022-01-26 ENCOUNTER — Ambulatory Visit: Payer: Medicare HMO | Attending: Internal Medicine

## 2022-01-26 DIAGNOSIS — R0683 Snoring: Secondary | ICD-10-CM

## 2022-01-26 NOTE — Telephone Encounter (Signed)
I tried to call the requesting office back but was on hold. Requesting office checking on clearance request. We did send FYI to requesting office 01/08/22 that the pt has appt 01/28/22 with DR. McDowell which pre op clearance will be addressed at that time.  I will fax another FYI to the requesting office the pt has appt 01/28/22. Once Dr. Domenic Polite clears the pt he will have his nurse/cma fax over his ov notes giving with clearance and any medication recommendations.

## 2022-01-27 NOTE — Progress Notes (Unsigned)
Cardiology Office Note  Date: 01/28/2022   ID: Calvin Cochran, DOB January 19, 1941, MRN [id number]  PCP:  System, Provider Not In  Cardiologist:  Rozann Lesches, MD Electrophysiologist:  Cristopher Peru, MD   Chief Complaint  Patient presents with   Cardiac follow-up    History of Present Illness: Calvin Cochran is a 81 y.o. male last seen in April.  He is here for a routine visit.  Reports intermittent shortness of breath, no definite sense of palpitations and no exertional chest pain.  He has been limited by significant lower back pain.  He is being considered for a bilateral L4-5 TFESI with request to hold Eliquis.  Recommendation would be to hold Eliquis 3 days prior to the injection.  We discussed this today.  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 Lovena Le.  Device interrogation in September demonstrated 21% AF burden.  I reviewed his medications and we also discussed increasing Toprol-XL to 25 mg daily.  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 Lovena Le    Past Surgical History:  Procedure Laterality Date   CORONARY STENT INTERVENTION N/A 02/01/2017   Procedure: CORONARY STENT INTERVENTION;  Surgeon: Lorretta Harp, MD;  Location: [place] CV LAB;  Service: Cardiovascular;  Laterality: N/A;  LAD Prox-Mid lesion stented   LEFT HEART CATH AND CORONARY ANGIOGRAPHY N/A 02/01/2017   Procedure: LEFT HEART CATH AND CORONARY ANGIOGRAPHY;  Surgeon: Lorretta Harp, MD;  Location: [place] CV LAB;  Service: Cardiovascular;  Laterality: N/A;   PACEMAKER IMPLANT N/A 09/09/2020   Procedure: PACEMAKER IMPLANT;  Surgeon: Evans Lance, MD;  Location: [facility];  Service: Cardiovascular;   Laterality: N/A;    Current Outpatient Medications  Medication Sig Dispense Refill   amLODipine (NORVASC) 10 MG tablet TAKE 1 TABLET BY MOUTH EVERY DAY 90 tablet 2   apixaban (ELIQUIS) 5 MG TABS tablet Take 1 tablet (5 mg total) by mouth 2 (two) times daily. 180 tablet 2   atorvastatin (LIPITOR) 40 MG tablet Take 1 tablet (40 mg total) by mouth daily. 90 tablet 2   baclofen (LIORESAL) 10 MG tablet TAKE 1 TABLET BY MOUTH EVERY DAY AS NEEDED FOR UP TO 30 DAYS     furosemide (LASIX) 20 MG tablet Take 1 tablet (20 mg total) by mouth daily. 90 tablet 3   HYDROcodone-acetaminophen (NORCO/VICODIN) 5-325 MG tablet Take 1 tablet by mouth every 6 (six) hours as needed.     losartan (COZAAR) 50 MG tablet Take 1 tablet (50 mg total) by mouth daily. 90 tablet 2   nitroGLYCERIN (NITROSTAT) 0.4 MG SL tablet Place 1 tablet (0.4 mg total) under the tongue every 5 (five) minutes x 3 doses as needed for chest pain. 10 tablet 0   metoprolol succinate (TOPROL-XL) 25 MG 24 hr tablet Take 1 tablet (25 mg total) by mouth daily. 90 tablet 1   No current facility-administered medications for this visit.   Allergies:  Amoxicillin   ROS: No syncope.  Physical Exam: VS:  BP 126/74   Pulse 74   Ht 5\' 9"  (1.753 m)   Wt 200 lb (90.7 kg)   SpO2 97%   BMI 29.53 kg/m , BMI  Body mass index is 29.53 kg/m.  Wt Readings from Last 3 Encounters:  01/28/22 200 lb (90.7 kg)  01/06/22 206 lb (93.4 kg)  07/10/21 199 lb 6.4 oz (90.4 kg)    General: Patient appears comfortable at rest. HEENT: Conjunctiva and lids normal. Neck: Supple, no elevated JVP or carotid bruits. Lungs: Clear to auscultation, nonlabored breathing at rest. Cardiac: Regular rate and rhythm, no S3, 2/6 systolic murmur. Extremities: No pitting edema.  ECG:  An ECG dated 07/10/2021 was personally reviewed today and demonstrated:  Sinus rhythm with intermittent atrial pacing, nonspecific ST-T changes.  Recent Labwork: 01/19/2022: BUN 21; Creatinine,  Ser 1.36; Potassium 3.6; Sodium 140     Component Value Date/Time   CHOL 193 02/01/2017 0758   TRIG 115 02/01/2017 0758   HDL 43 02/01/2017 0758   CHOLHDL 4.5 02/01/2017 0758   VLDL 23 02/01/2017 0758   LDLCALC 127 (H) 02/01/2017 0758    Other Studies Reviewed Today:  Echocardiogram 08/12/2021:  1. Left ventricular ejection fraction, by estimation, is 65 to 70%. The  left ventricle has normal function. The left ventricle has no regional  wall motion abnormalities. Left ventricular diastolic parameters are  indeterminate. The average left  ventricular global longitudinal strain is -20.0 %. The global longitudinal  strain is normal.   2. Right ventricular systolic function is normal. The right ventricular  size is normal. There is normal pulmonary artery systolic pressure.   3. The mitral valve was not well visualized. No evidence of mitral valve  regurgitation. No evidence of mitral stenosis.   4. The aortic valve is tricuspid. Aortic valve regurgitation is mild. No  aortic stenosis is present.   5. The inferior vena cava is normal in size with greater than 50%  respiratory variability, suggesting right atrial pressure of 3 mmHg.   Assessment and Plan:  1.  Paroxysmal atrial fibrillation with CHA2DS2-VASc score of 3.  Last device interrogation indicated 21% AF burden.  He does have intermittent shortness of breath but no definite palpitations.  Plan to increase Toprol-XL to 25 mg daily.  May need to be considered for antiarrhythmic therapy if rhythm burden increases, he already follows with Dr. Ladona Ridgel.  In terms of Eliquis, this will need to be held 3 days prior to lumbar injection as discussed above.  2.  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  Keep follow-up with Dr. Ladona Ridgel.  3.  CAD status post DES to the LAD in 2018.  Reports no definite angina at this time.  Not on aspirin given use of Eliquis.  Continue Norvasc, Cozaar, Toprol-XL, Lipitor and as  needed nitroglycerin.  Medication Adjustments/Labs and Tests Ordered: Current medicines are reviewed at length with the patient today.  Concerns regarding medicines are outlined above.   Tests Ordered: No orders of the defined types were placed in this encounter.   Medication Changes: Meds ordered this encounter  Medications   metoprolol succinate (TOPROL-XL) 25 MG 24 hr tablet    Sig: Take 1 tablet (25 mg total) by mouth daily.    Dispense:  90 tablet    Refill:  1    01/28/22 Dose Increase    Disposition:  Follow up  6 months.  Signed, Jonelle Sidle, MD, [facility] 01/28/2022 9:45 AM    [facility] [address] [place], [place], [place] [address] Phone: [phone]; Fax: [phone]

## 2022-01-28 ENCOUNTER — Encounter: Payer: Self-pay | Admitting: Cardiology

## 2022-01-28 ENCOUNTER — Ambulatory Visit: Payer: Medicare HMO | Attending: Cardiology | Admitting: Cardiology

## 2022-01-28 VITALS — BP 126/74 | HR 74 | Ht 69.0 in | Wt 200.0 lb

## 2022-01-28 DIAGNOSIS — I25119 Atherosclerotic heart disease of native coronary artery with unspecified angina pectoris: Secondary | ICD-10-CM | POA: Diagnosis not present

## 2022-01-28 DIAGNOSIS — I480 Paroxysmal atrial fibrillation: Secondary | ICD-10-CM | POA: Diagnosis not present

## 2022-01-28 DIAGNOSIS — I495 Sick sinus syndrome: Secondary | ICD-10-CM

## 2022-01-28 MED ORDER — METOPROLOL SUCCINATE ER 25 MG PO TB24: 25.0000 mg | ORAL_TABLET | Freq: Every day | ORAL | 1 refills | Status: DC

## 2022-01-28 NOTE — Patient Instructions (Addendum)
Medication Instructions:  Your physician has recommended you make the following change in your medication:  Increase metoprolol succinate to 25 mg once a day Continue all other medications as directed  Labwork: none  Testing/Procedures: none  Follow-Up:  Your physician recommends that you schedule a follow-up appointment in: 6 months  Any Other Special Instructions Will Be Listed Below (If Applicable).  If you need a refill on your cardiac medications before your next appointment, please call your pharmacy.

## 2022-02-25 ENCOUNTER — Telehealth: Payer: Self-pay | Admitting: *Deleted

## 2022-02-25 DIAGNOSIS — G4733 Obstructive sleep apnea (adult) (pediatric): Secondary | ICD-10-CM

## 2022-02-25 DIAGNOSIS — I10 Essential (primary) hypertension: Secondary | ICD-10-CM

## 2022-02-25 DIAGNOSIS — I25119 Atherosclerotic heart disease of native coronary artery with unspecified angina pectoris: Secondary | ICD-10-CM

## 2022-02-25 NOTE — Telephone Encounter (Signed)
-----   Message from Gaynelle Cage, CMA sent at 01/26/2022  8:30 AM EDT -----  ----- Message ----- From: Quintella Reichert, MD Sent: 01/25/2022   6:48 PM EDT To: Cv Div Sleep Studies  Please let patient know that they have sleep apnea.  Recommend therapeutic CPAP titration for treatment of patient's sleep disordered breathing.  If unable to perform an in lab titration then initiate ResMed auto CPAP from 4 to 15cm H2O with heated humidity and mask of choice and overnight pulse ox on CPAP.

## 2022-03-02 NOTE — Telephone Encounter (Signed)
The patient has been notified of the result and verbalized understanding.  All questions (if any) were answered. Latrelle Dodrill, CMA 03/02/2022 5:07 PM    The patient has declined to go back for more testing and states he does not want a cpap machine.

## 2022-03-31 NOTE — Telephone Encounter (Signed)
Prior Authorization for TITRATION sent to [facility] via Phone. Reference # DENIED.

## 2022-04-03 LAB — CUP PACEART REMOTE DEVICE CHECK
Battery Remaining Longevity: 96 mo
Battery Remaining Percentage: 87 %
Battery Voltage: 3.02 V
Brady Statistic AP VP Percent: 2.5 %
Brady Statistic AP VS Percent: 55 %
Brady Statistic AS VP Percent: 1.7 %
Brady Statistic AS VS Percent: 40 %
Brady Statistic RA Percent Paced: 39 %
Brady Statistic RV Percent Paced: 17 %
Date Time Interrogation Session: 20231229020033
Implantable Lead Connection Status: 753985
Implantable Lead Connection Status: 753985
Implantable Lead Implant Date: 20220606
Implantable Lead Implant Date: 20220606
Implantable Lead Location: 753859
Implantable Lead Location: 753860
Implantable Pulse Generator Implant Date: 20220606
Lead Channel Impedance Value: 400 Ohm
Lead Channel Impedance Value: 480 Ohm
Lead Channel Pacing Threshold Amplitude: 0.5 V
Lead Channel Pacing Threshold Amplitude: 1 V
Lead Channel Pacing Threshold Pulse Width: 0.5 ms
Lead Channel Pacing Threshold Pulse Width: 0.5 ms
Lead Channel Sensing Intrinsic Amplitude: 12 mV
Lead Channel Sensing Intrinsic Amplitude: 3.4 mV
Lead Channel Setting Pacing Amplitude: 2 V
Lead Channel Setting Pacing Amplitude: 2.5 V
Lead Channel Setting Pacing Pulse Width: 0.5 ms
Lead Channel Setting Sensing Sensitivity: 2 mV
Pulse Gen Model: 2272
Pulse Gen Serial Number: 6460902

## 2022-04-22 ENCOUNTER — Ambulatory Visit: Payer: Medicare HMO | Attending: Internal Medicine | Admitting: Internal Medicine

## 2022-04-22 ENCOUNTER — Ambulatory Visit: Payer: Medicare HMO | Admitting: Internal Medicine

## 2022-04-22 ENCOUNTER — Encounter: Payer: Self-pay | Admitting: Internal Medicine

## 2022-04-22 ENCOUNTER — Telehealth: Payer: Self-pay | Admitting: Pharmacist

## 2022-04-22 VITALS — BP 116/78 | HR 70 | Ht 69.0 in | Wt 199.0 lb

## 2022-04-22 DIAGNOSIS — I495 Sick sinus syndrome: Secondary | ICD-10-CM | POA: Diagnosis not present

## 2022-04-22 DIAGNOSIS — I480 Paroxysmal atrial fibrillation: Secondary | ICD-10-CM

## 2022-04-22 LAB — CUP PACEART INCLINIC DEVICE CHECK
Battery Remaining Longevity: 102 mo
Battery Voltage: 3.01 V
Brady Statistic RA Percent Paced: 43 %
Brady Statistic RV Percent Paced: 15 %
Date Time Interrogation Session: 20240117161404
Implantable Lead Connection Status: 753985
Implantable Lead Connection Status: 753985
Implantable Lead Implant Date: 20220606
Implantable Lead Implant Date: 20220606
Implantable Lead Location: 753859
Implantable Lead Location: 753860
Implantable Pulse Generator Implant Date: 20220606
Lead Channel Impedance Value: 412.5 Ohm
Lead Channel Impedance Value: 475 Ohm
Lead Channel Pacing Threshold Amplitude: 1.25 V
Lead Channel Pacing Threshold Amplitude: 1.25 V
Lead Channel Pacing Threshold Pulse Width: 0.5 ms
Lead Channel Pacing Threshold Pulse Width: 0.5 ms
Lead Channel Sensing Intrinsic Amplitude: 0.8 mV
Lead Channel Sensing Intrinsic Amplitude: 12 mV
Lead Channel Setting Pacing Amplitude: 2 V
Lead Channel Setting Pacing Amplitude: 2.5 V
Lead Channel Setting Pacing Pulse Width: 0.5 ms
Lead Channel Setting Sensing Sensitivity: 2 mV
Pulse Gen Model: 2272
Pulse Gen Serial Number: 6460902

## 2022-04-22 NOTE — Patient Instructions (Signed)
Medication Instructions:  Your physician recommends that you continue on your current medications as directed. Please refer to the Current Medication list given to you today.  *If you need a refill on your cardiac medications before your next appointment, please call your pharmacy*   Lab Work: NONE   If you have labs (blood work) drawn today and your tests are completely normal, you will receive your results only by: [place] (if you have MyChart) OR A paper copy in the mail If you have any lab test that is abnormal or we need to change your treatment, we will call you to review the results.   Testing/Procedures: Dofetilide (Tikosyn) Admission  Prior to day of admission: Check with drug insurance company for cost of drug to ensure affordability --- Dofetilide 500 mcg twice a day.  GoodRx is an option if insurance copay is unaffordable.  A pharmacist will review all your medications for potential interactions with Tikosyn. If any medication changes are needed prior to admission we will be in touch with you.  If any new medications are started AFTER your admission date is set with [facility] [phone]). Please notify our office immediately so your medication list can be updated and reviewed by our pharmacist again.  No Benadryl is allowed 3 days prior to admission.  Please ensure no missed doses of your anticoagulation (blood thinner) for 3 weeks prior to admission. If a dose is missed please notify our office immediately.  Tikosyn initiation requires a 3 night/4 day hospital stay with constant telemetry monitoring. You will have an EKG after each dose of Tikosyn as well as daily lab draws. On day of admission: [facility] office visit on the morning of admission is needed for preliminary labs/ekg.  You may bring personal belongings/clothing with you to the hospital. Please leave your suitcase in the car until you arrive in admissions.  Time of admission is dependent on bed  availability in the hospital. In some instances, you will be sent home until bed is available. Rarely admission can be delayed to the following day if hospital census prevents available beds.  If the drug does not convert you to normal rhythm a cardioversion after the 4th dose of Tikosyn.  Questions please call our office at [phone]    Follow-Up: At [facility], you and your health needs are our priority.  As part of our continuing mission to provide you with exceptional heart care, we have created designated Provider Care Teams.  These Care Teams include your primary Cardiologist (physician) and Advanced Practice Providers (APPs -  Physician Assistants and Nurse Practitioners) who all work together to provide you with the care you need, when you need it.  We recommend signing up for the patient portal called "MyChart".  Sign up information is provided on this After Visit Summary.  MyChart is used to connect with patients for Virtual Visits (Telemedicine).  Patients are able to view lab/test results, encounter notes, upcoming appointments, etc.  Non-urgent messages can be sent to your provider as well.   To learn more about what you can do with MyChart, go to [email or web address].    Your next appointment:    To Be Determined   Provider:   Cristopher Peru, MD    Other Instructions Thank you for choosing [facility]!   [name]

## 2022-04-22 NOTE — Progress Notes (Signed)
HPI [name] returns today for followup. 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 In the interim he has had some sob and he notes that his wife states that he snores loudly and at times stops breathing. He is pending a possible spinal injection. He does not have palpitations. He is out of rhythm about 20% of the time.   Allergies  Allergen Reactions   Amoxicillin Itching     Current Outpatient Medications  Medication Sig Dispense Refill   amLODipine (NORVASC) 10 MG tablet TAKE 1 TABLET BY MOUTH EVERY DAY 90 tablet 2   apixaban (ELIQUIS) 5 MG TABS tablet Take 1 tablet (5 mg total) by mouth 2 (two) times daily. 180 tablet 2   atorvastatin (LIPITOR) 40 MG tablet Take 1 tablet (40 mg total) by mouth daily. 90 tablet 2   baclofen (LIORESAL) 10 MG tablet TAKE 1 TABLET BY MOUTH EVERY DAY AS NEEDED FOR UP TO 30 DAYS     furosemide (LASIX) 20 MG tablet Take 1 tablet (20 mg total) by mouth daily. 90 tablet 3   HYDROcodone-acetaminophen (NORCO/VICODIN) 5-325 MG tablet Take 1 tablet by mouth every 6 (six) hours as needed.     losartan (COZAAR) 50 MG tablet Take 1 tablet (50 mg total) by mouth daily. 90 tablet 2   metoprolol succinate (TOPROL-XL) 25 MG 24 hr tablet Take 1 tablet (25 mg total) by mouth daily. 90 tablet 1   nitroGLYCERIN (NITROSTAT) 0.4 MG SL tablet Place 1 tablet (0.4 mg total) under the tongue every 5 (five) minutes x 3 doses as needed for chest pain. 10 tablet 0   No current facility-administered medications for this visit.     Past Medical History:  Diagnosis Date   CAD (coronary artery disease)    DES to mid LAD October 2018   Essential hypertension    NSTEMI (non-ST elevated myocardial infarction) ([place]) 01/2017   Paroxysmal atrial fibrillation (HCC)    Tachycardia-bradycardia syndrome [facility]    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 Lovena Le    ROS:   All systems reviewed and negative except as noted in the HPI.   Past  Surgical History:  Procedure Laterality Date   CORONARY STENT INTERVENTION N/A 02/01/2017   Procedure: CORONARY STENT INTERVENTION;  Surgeon: Lorretta Harp, MD;  Location: [place] [facility];  Service: Cardiovascular;  Laterality: N/A;  LAD Prox-Mid lesion stented   LEFT HEART CATH AND CORONARY ANGIOGRAPHY N/A 02/01/2017   Procedure: LEFT HEART CATH AND CORONARY ANGIOGRAPHY;  Surgeon: Lorretta Harp, MD;  Location: [place] [facility];  Service: Cardiovascular;  Laterality: N/A;   PACEMAKER IMPLANT N/A 09/09/2020   Procedure: PACEMAKER IMPLANT;  Surgeon: Evans Lance, MD;  Location: [place] [facility];  Service: Cardiovascular;  Laterality: N/A;     Family History  Problem Relation Age of Onset   Lung cancer Father      Social History   Socioeconomic History   Marital status: Married    Spouse name: Not on file   Number of children: Not on file   Years of education: Not on file   Highest education level: Not on file  Occupational History   Not on file  Tobacco Use   Smoking status: Never   Smokeless tobacco: Never  Vaping Use   Vaping Use: Never used  Substance and Sexual Activity   Alcohol use: Yes    Comment: Occasional  Drug use: No   Sexual activity: Not on file  Other Topics Concern   Not on file  Social History Narrative   Not on file   Social Determinants of Health   Financial Resource Strain: Not on file  Food Insecurity: Not on file  Transportation Needs: Not on file  Physical Activity: Not on file  Stress: Not on file  Social Connections: Not on file  Intimate Partner Violence: Not on file     There were no vitals taken for this visit.  Physical Exam:  Well appearing NAD HEENT: Unremarkable Neck:  No JVD, no thyromegally Lymphatics:  No adenopathy Back:  No CVA tenderness Lungs:  Clear HEART:  Regular rate rhythm, no murmurs, no rubs, no clicks Abd:  soft, positive bowel sounds, no organomegally, no rebound, no guarding Ext:  2 plus  pulses, no edema, no cyanosis, no clubbing Skin:  No rashes no nodules Neuro:  CN II through XII intact, motor grossly intact   DEVICE  Normal device function.  See PaceArt for details.   Assess/Plan:  Pacemaker - interrogation of his [place] DDD PM is working normally. We will recheck in several months. Tachybrady syndrome - he is going out of rhythm about 20% of the time. He is asymptomatic. PAF - interrogation of his PPM demonstrates worsening atrial fib.  He will continue eliquis. He appears to have worsening symptoms. I have offered him admit for initiation of dofetilide. CAD -  he is s/p PCI with Dr. Hiram Comber and doing well. No change in meds. Dyspnea - I have asked him to start lasix 20 mg daily.   [name],MD

## 2022-04-22 NOTE — Telephone Encounter (Signed)
Medication list reviewed in anticipation of upcoming Tikosyn initiation. Patient is not taking any contraindicated or QTc prolonging medications.   Patient is anticoagulated on Eliquis 5mg BID on the appropriate dose. Please ensure that patient has not missed any anticoagulation doses in the 3 weeks prior to Tikosyn initiation.   Patient will need to be counseled to avoid use of Benadryl while on Tikosyn and in the 2-3 days prior to Tikosyn initiation. 

## 2022-05-05 ENCOUNTER — Telehealth: Payer: Self-pay | Admitting: Cardiology

## 2022-05-05 NOTE — Telephone Encounter (Signed)
   Pre-operative Risk Assessment    Patient Name: Calvin Cochran  DOB: 1941/02/09 MRN: [id number]      Request for Surgical Clearance    Procedure:   BIL LM-S TFESI   Date of Surgery:  Clearance TBD                                  Surgeon:  DAVID O'TOOLE  Surgeon's Group or Practice Name:  [facility]  Phone number:  [phone] Fax number:  [phone]   Type of Clearance Requested:   - Pharmacy:  Hold Apixaban (Eliquis) and FOR   3 DAYS   Type of Anesthesia:  Not Indicated   Additional requests/questions:    [name]   05/05/2022, 10:53 AM

## 2022-05-06 NOTE — Telephone Encounter (Signed)
   Primary Cardiologist: Rozann Lesches, MD  Chart reviewed as part of pre-operative protocol coverage. Given past medical history and time since last visit, based on ACC/AHA guidelines, Calvin Cochran would be at acceptable risk for the planned procedure without further cardiovascular testing.   Spoke with patient and advised that if he develops new symptoms prior to surgery to contact our office to arrange a follow-up appointment. He verbalized understanding.  Per office protocol, he may hold Eliquis for 3 days prior to [facility], HOWEVER, patient is aware he cannot hold Eliquis for 3 weeks prior to and 4 weeks following the initiation of Tikosyn. He verbalized understanding and agreement of these instructions.   I will route this recommendation to the requesting party via Epic fax function and remove from pre-op pool.  Please call with questions.  Emmaline Life, NP-C  05/06/2022, 3:14 PM [address], [address] Office [phone] Fax [phone]

## 2022-05-06 NOTE — Telephone Encounter (Signed)
Patient with diagnosis of afib on Eliquis for anticoagulation.    Procedure: BIL LM-S TFESI   Date of procedure: TBD  CHA2DS2-VASc Score = 4  This indicates a 4.8% annual risk of stroke. [name] score is based upon: CHF History: 0 HTN History: 1 Diabetes History: 0 Stroke History: 0 Vascular Disease History: 1 Age Score: 2 Gender Score: 0   CrCl 46mL/min Platelet count 247K  Pt is pending Tikosyn initiation but date is not set yet. He will not be able to hold anticoag for 3 weeks prior to and 4 weeks after Tikosyn initiation. As long as procedure falls outside this window, he can hold Eliquis for 3 days prior to procedure.  **This guidance is not considered finalized until pre-operative APP has relayed final recommendations.**

## 2022-05-06 NOTE — Telephone Encounter (Signed)
Left message for patient to call back to ensure he is aware of guidelines for anticoagulation in regards to Tikosyn initiation.

## 2022-05-20 ENCOUNTER — Other Ambulatory Visit: Payer: Self-pay | Admitting: Cardiology

## 2022-05-20 NOTE — Telephone Encounter (Signed)
Prescription refill request for Eliquis received. Indication: PAF Last office visit:  04/22/22  Beckie Salts MD Scr: 1.36 on 01/19/22 Age: 82 Weight: 90.3kg  Based on above findings Eliquis 65m twice daily is the appropriate dose.  Refill approved.

## 2022-05-29 ENCOUNTER — Encounter (HOSPITAL_COMMUNITY): Payer: Self-pay

## 2022-06-01 ENCOUNTER — Encounter (HOSPITAL_COMMUNITY): Payer: Self-pay | Admitting: Internal Medicine

## 2022-06-01 ENCOUNTER — Ambulatory Visit (HOSPITAL_COMMUNITY)
Admission: RE | Admit: 2022-06-01 | Discharge: 2022-06-01 | Disposition: A | Discharge status: Home / Self Care | Payer: Medicare HMO | Source: Ambulatory Visit | Attending: Physician Assistant | Admitting: Physician Assistant

## 2022-06-01 ENCOUNTER — Inpatient Hospital Stay (HOSPITAL_COMMUNITY)
Admission: RE | Admit: 2022-06-01 | Discharge: 2022-06-04 | DRG: 309 | Disposition: A | Discharge status: Home / Self Care | Payer: Medicare HMO | Source: Ambulatory Visit | Attending: Internal Medicine | Admitting: Internal Medicine

## 2022-06-01 ENCOUNTER — Other Ambulatory Visit (HOSPITAL_COMMUNITY): Payer: Self-pay

## 2022-06-01 ENCOUNTER — Other Ambulatory Visit: Payer: Self-pay

## 2022-06-01 VITALS — BP 118/70 | HR 72 | Ht 69.0 in | Wt 200.8 lb

## 2022-06-01 DIAGNOSIS — Z955 Presence of coronary angioplasty implant and graft: Secondary | ICD-10-CM | POA: Diagnosis not present

## 2022-06-01 DIAGNOSIS — Z880 Allergy status to penicillin: Secondary | ICD-10-CM | POA: Diagnosis not present

## 2022-06-01 DIAGNOSIS — I480 Paroxysmal atrial fibrillation: Secondary | ICD-10-CM | POA: Diagnosis not present

## 2022-06-01 DIAGNOSIS — Z950 Presence of cardiac pacemaker: Secondary | ICD-10-CM

## 2022-06-01 DIAGNOSIS — Z801 Family history of malignant neoplasm of trachea, bronchus and lung: Secondary | ICD-10-CM | POA: Diagnosis not present

## 2022-06-01 DIAGNOSIS — I10 Essential (primary) hypertension: Secondary | ICD-10-CM | POA: Diagnosis present

## 2022-06-01 DIAGNOSIS — I4819 Other persistent atrial fibrillation: Secondary | ICD-10-CM | POA: Diagnosis present

## 2022-06-01 DIAGNOSIS — I252 Old myocardial infarction: Secondary | ICD-10-CM | POA: Diagnosis not present

## 2022-06-01 DIAGNOSIS — I2510 Atherosclerotic heart disease of native coronary artery without angina pectoris: Secondary | ICD-10-CM | POA: Diagnosis present

## 2022-06-01 DIAGNOSIS — D6869 Other thrombophilia: Secondary | ICD-10-CM | POA: Diagnosis present

## 2022-06-01 DIAGNOSIS — I495 Sick sinus syndrome: Secondary | ICD-10-CM | POA: Diagnosis present

## 2022-06-01 DIAGNOSIS — Z79899 Other long term (current) drug therapy: Secondary | ICD-10-CM | POA: Diagnosis not present

## 2022-06-01 DIAGNOSIS — Z7901 Long term (current) use of anticoagulants: Secondary | ICD-10-CM | POA: Diagnosis not present

## 2022-06-01 LAB — BASIC METABOLIC PANEL
Anion gap: 10 (ref 5–15)
BUN: 21 mg/dL (ref 8–23)
CO2: 27 mmol/L (ref 22–32)
Calcium: 9 mg/dL (ref 8.9–10.3)
Chloride: 105 mmol/L (ref 98–111)
Creatinine, Ser: 1.43 mg/dL — ABNORMAL HIGH (ref 0.61–1.24)
GFR, Estimated: 50 mL/min — ABNORMAL LOW (ref >60)
Glucose, Bld: 97 mg/dL (ref 70–99)
Potassium: 3.9 mmol/L (ref 3.5–5.1)
Sodium: 142 mmol/L (ref 135–145)

## 2022-06-01 LAB — MAGNESIUM: Magnesium: 1.9 mg/dL (ref 1.7–2.4)

## 2022-06-01 MED ORDER — APIXABAN 5 MG PO TABS
5.0000 mg | ORAL_TABLET | Freq: Two times a day (BID) | ORAL | Status: DC
  Administered 2022-06-01 – 2022-06-04 (×6): 5 mg via ORAL
  Filled 2022-06-01 (×6): qty 1

## 2022-06-01 MED ORDER — SODIUM CHLORIDE 0.9 % IV SOLN: 250.0000 mL | INTRAVENOUS | Status: DC | PRN

## 2022-06-01 MED ORDER — SODIUM CHLORIDE 0.9% FLUSH
3.0000 mL | Freq: Two times a day (BID) | INTRAVENOUS | Status: DC
  Administered 2022-06-01 – 2022-06-04 (×4): 3 mL via INTRAVENOUS

## 2022-06-01 MED ORDER — AMLODIPINE BESYLATE 10 MG PO TABS
10.0000 mg | ORAL_TABLET | Freq: Every day | ORAL | Status: DC
  Administered 2022-06-02 – 2022-06-04 (×3): 10 mg via ORAL
  Filled 2022-06-01 (×3): qty 1

## 2022-06-01 MED ORDER — MAGNESIUM SULFATE 2 GM/50ML IV SOLN
2.0000 g | Freq: Once | INTRAVENOUS | Status: AC
  Administered 2022-06-01: 2 g via INTRAVENOUS
  Filled 2022-06-01: qty 50

## 2022-06-01 MED ORDER — LOSARTAN POTASSIUM 50 MG PO TABS
50.0000 mg | ORAL_TABLET | Freq: Every day | ORAL | Status: DC
  Administered 2022-06-02 – 2022-06-04 (×3): 50 mg via ORAL
  Filled 2022-06-01 (×3): qty 1

## 2022-06-01 MED ORDER — FUROSEMIDE 20 MG PO TABS
20.0000 mg | ORAL_TABLET | Freq: Every day | ORAL | Status: DC
  Administered 2022-06-02 – 2022-06-04 (×3): 20 mg via ORAL
  Filled 2022-06-01 (×3): qty 1

## 2022-06-01 MED ORDER — DOFETILIDE 250 MCG PO CAPS
250.0000 ug | ORAL_CAPSULE | Freq: Two times a day (BID) | ORAL | Status: DC
  Administered 2022-06-01 – 2022-06-04 (×6): 250 ug via ORAL
  Filled 2022-06-01 (×6): qty 1

## 2022-06-01 MED ORDER — NITROGLYCERIN 0.4 MG SL SUBL: 0.4000 mg | SUBLINGUAL_TABLET | SUBLINGUAL | Status: DC | PRN

## 2022-06-01 MED ORDER — ATORVASTATIN CALCIUM 40 MG PO TABS
40.0000 mg | ORAL_TABLET | Freq: Every day | ORAL | Status: DC
  Administered 2022-06-02 – 2022-06-04 (×3): 40 mg via ORAL
  Filled 2022-06-01 (×3): qty 1

## 2022-06-01 MED ORDER — METOPROLOL SUCCINATE ER 25 MG PO TB24
25.0000 mg | ORAL_TABLET | Freq: Every day | ORAL | Status: DC
  Administered 2022-06-02 – 2022-06-04 (×3): 25 mg via ORAL
  Filled 2022-06-01 (×3): qty 1

## 2022-06-01 MED ORDER — SODIUM CHLORIDE 0.9% FLUSH: 3.0000 mL | INTRAVENOUS | Status: DC | PRN

## 2022-06-01 MED ORDER — HYDROCODONE-ACETAMINOPHEN 5-325 MG PO TABS: 1.0000 | ORAL_TABLET | Freq: Four times a day (QID) | ORAL | Status: DC | PRN

## 2022-06-01 MED ORDER — POTASSIUM CHLORIDE CRYS ER 20 MEQ PO TBCR
40.0000 meq | EXTENDED_RELEASE_TABLET | Freq: Once | ORAL | Status: AC
  Administered 2022-06-01: 40 meq via ORAL
  Filled 2022-06-01: qty 2

## 2022-06-01 NOTE — TOC Benefit Eligibility Note (Signed)
Patient Teacher, English as a foreign language completed.    The patient is currently admitted and upon discharge could be taking dofetilide (Tikosyn) 250 mcg capsules.  The current 30 day co-pay is $100.00.   The patient is insured through [place], [place] Patient Advocate Specialist [place] Patient Advocate Team Direct Number: [phone]  Fax: [phone]

## 2022-06-01 NOTE — Care Management (Signed)
  Transition of Care [facility]) Screening Note   Patient Details  Name: Calvin Cochran Date of Birth: 10/10/1940   Transition of Care [facility]) CM/SW Contact:    Bethena Roys, RN Phone Number: 06/01/2022, 2:36 PM    Transition of Care Department [facility]) has reviewed the patient. Patient presented for Tikosyn Load. Benefits check submitted for cost $100.00. Case Manager will discuss cost and pharmacy of choice as the patient progresses.

## 2022-06-01 NOTE — H&P (Addendum)
Primary Care Physician: System, Provider Not In Primary Cardiologist: Dr Domenic Polite Primary Electrophysiologist: Dr Lovena Le Referring Physician: Dr Alona Bene is a 82 y.o. male with a history of CAD, HTN, tachybradycardia syndrome s/p PPM, atrial fibrillation who presents for follow up in the [facility]. Patient noted to have a higher burden on afib on his PPM, >20% and Dr Lovena Le recommended dofetilide. Patient is on Eliquis for a CHADS2VASC score of 4.  On follow up today, patient reports that he frequently feels SOB and fatigued with exertion. He presents today for dofetilide loading. He has only been taking Eliquis once daily. He was in SR this morning during AF clinic appt. At admission to [facility], is in AFib.   Today, he denies symptoms of palpitations, chest pain, orthopnea, PND, lower extremity edema, dizziness, presyncope, syncope, snoring, daytime somnolence, bleeding, or neurologic sequela. The patient is tolerating medications without difficulties and is otherwise without complaint today.    Atrial Fibrillation Risk Factors:  he does not have symptoms or diagnosis of sleep apnea. he does not have a history of rheumatic fever.   he has a BMI of Body mass index is 29.65 kg/m.[name] Filed Weights   06/01/22 1240  Weight: 91.1 kg    Family History  Problem Relation Age of Onset   Lung cancer Father      Atrial Fibrillation Management history:  Previous antiarrhythmic drugs: none Previous cardioversions: none Previous ablations: none CHADS2VASC score: 4 Anticoagulation history: Eliquis   Past Medical History:  Diagnosis Date   CAD (coronary artery disease)    DES to mid LAD October 2018   Essential hypertension    NSTEMI (non-ST elevated myocardial infarction) ([place]) 01/2017   Paroxysmal atrial fibrillation ([place])    Tachycardia-bradycardia syndrome [facility])    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 Lovena Le   Past Surgical History:   Procedure Laterality Date   CORONARY STENT INTERVENTION N/A 02/01/2017   Procedure: CORONARY STENT INTERVENTION;  Surgeon: Lorretta Harp, MD;  Location: [place] [facility];  Service: Cardiovascular;  Laterality: N/A;  LAD Prox-Mid lesion stented   LEFT HEART CATH AND CORONARY ANGIOGRAPHY N/A 02/01/2017   Procedure: LEFT HEART CATH AND CORONARY ANGIOGRAPHY;  Surgeon: Lorretta Harp, MD;  Location: [place] [facility];  Service: Cardiovascular;  Laterality: N/A;   PACEMAKER IMPLANT N/A 09/09/2020   Procedure: PACEMAKER IMPLANT;  Surgeon: Evans Lance, MD;  Location: [place] [facility];  Service: Cardiovascular;  Laterality: N/A;    Current Facility-Administered Medications  Medication Dose Route Frequency Provider Last Rate Last Admin   0.9 %  sodium chloride infusion  250 mL Intravenous PRN Fenton, Clint R, PA       [START ON 06/02/2022] amLODipine (NORVASC) tablet 10 mg  10 mg Oral Daily Fenton, Clint R, PA       apixaban (ELIQUIS) tablet 5 mg  5 mg Oral BID Fenton, Clint R, PA       [START ON 06/02/2022] atorvastatin (LIPITOR) tablet 40 mg  40 mg Oral Daily Fenton, Clint R, PA       dofetilide (TIKOSYN) capsule 250 mcg  250 mcg Oral BID Fenton, Clint R, PA       [START ON 06/02/2022] furosemide (LASIX) tablet 20 mg  20 mg Oral Daily Fenton, Clint R, PA       HYDROcodone-acetaminophen (NORCO/VICODIN) 5-325 MG per tablet 1 tablet  1 tablet Oral Q6H PRN Fenton, Clint R, PA       [  START ON 06/02/2022] losartan (COZAAR) tablet 50 mg  50 mg Oral Daily Fenton, Clint R, PA       [START ON 06/02/2022] metoprolol succinate (TOPROL-XL) 24 hr tablet 25 mg  25 mg Oral Daily Fenton, Clint R, PA       nitroGLYCERIN (NITROSTAT) SL tablet 0.4 mg  0.4 mg Sublingual Q5 Min x 3 PRN Fenton, Clint R, PA       sodium chloride flush (NS) 0.9 % injection 3 mL  3 mL Intravenous Q12H Fenton, Clint R, PA       sodium chloride flush (NS) 0.9 % injection 3 mL  3 mL Intravenous PRN Fenton, Clint R, PA         Allergies  Allergen Reactions   Amoxicillin Itching    Social History   Socioeconomic History   Marital status: Married    Spouse name: Not on file   Number of children: Not on file   Years of education: Not on file   Highest education level: Not on file  Occupational History   Not on file  Tobacco Use   Smoking status: Never   Smokeless tobacco: Never  Vaping Use   Vaping Use: Never used  Substance and Sexual Activity   Alcohol use: Yes    Comment: Occasional   Drug use: No   Sexual activity: Not on file  Other Topics Concern   Not on file  Social History Narrative   Not on file   Social Determinants of Health   Financial Resource Strain: Not on file  Food Insecurity: Not on file  Transportation Needs: Not on file  Physical Activity: Not on file  Stress: Not on file  Social Connections: Not on file  Intimate Partner Violence: Not on file     ROS- All systems are reviewed and negative except as per the HPI above.  Physical Exam: Vitals:   06/01/22 1240  BP: 104/75  Temp: 97.9 F (36.6 C)  TempSrc: Oral  Weight: 91.1 kg  Height: '5\' 9"'$  (1.753 m)    GEN- The patient is a well appearing elderly male, alert and oriented x 3 today.   Head- normocephalic, atraumatic Eyes-  Sclera clear, conjunctiva pink Ears- hearing intact Oropharynx- clear Neck- supple  Lungs- Clear to ausculation bilaterally, normal work of breathing Heart- Regular rate and rhythm, no murmurs, rubs or gallops  GI- soft, NT, ND, + BS Extremities- no clubbing, or cyanosis, 1+ BLE edema MS- no significant deformity or atrophy Skin- no rash or lesion Psych- euthymic mood, full affect Neuro- strength and sensation are intact  Wt Readings from Last 3 Encounters:  06/01/22 91.1 kg  06/01/22 91.1 kg  04/22/22 90.3 kg    EKG today demonstrates  SR Vent. rate 72 BPM PR interval 200 ms QRS duration 78 ms QT/QTcB 374/409 ms  Echo 08/12/21 demonstrated   1. Left ventricular  ejection fraction, by estimation, is 65 to 70%. The  left ventricle has normal function. The left ventricle has no regional  wall motion abnormalities. Left ventricular diastolic parameters are  indeterminate. The average left ventricular global longitudinal strain is -20.0 %. The global longitudinal strain is normal.   2. Right ventricular systolic function is normal. The right ventricular  size is normal. There is normal pulmonary artery systolic pressure.   3. The mitral valve was not well visualized. No evidence of mitral valve  regurgitation. No evidence of mitral stenosis.   4. The aortic valve is tricuspid. Aortic valve regurgitation is  mild. No  aortic stenosis is present.   5. The inferior vena cava is normal in size with greater than 50%  respiratory variability, suggesting right atrial pressure of 3 mmHg.   Comparison(s): LVEF 60-65%.   Epic records are reviewed at length today  CHA2DS2-VASc Score = 4  [name] score is based upon: CHF History: 0 HTN History: 1 Diabetes History: 0 Stroke History: 0 Vascular Disease History: 1 Age Score: 2 Gender Score: 0       ASSESSMENT AND PLAN: 1. Paroxysmal Atrial Fibrillation (ICD10:  I48.0) [name] CHA2DS2-VASc score is 4, indicating a 4.8% annual risk of stroke.   Patient presents for dofetilide admission. Continue Eliquis 5 mg BID, stressed importance of taking as prescribed. Was in SR this morning, is now in AF by tele No recent benadryl use PharmD has screened medications QTc in SR 409 ms Labs today show creatinine at 1.43, K+ 3.9 and mag 1.9, CrCl calculated at 54 mL/min Continue Toprol 25 mg daily  2. Secondary Hypercoagulable State (ICD10:  D68.69) The patient is at significant risk for stroke/thromboembolism based upon his CHA2DS2-VASc Score of 4.  Continue Apixaban (Eliquis).   3. HTN Stable, no changes today.  4. CAD DES to LAD in 2018 On statin No anginal symptoms.  5. Tachybradycardia  syndrome S/p PPM, followed by Dr Lovena Le and the device clinic.    [facility] [address] [place], [place] [address] [phone] 06/01/2022 1:24 PM  EP Attending  Patient seen and examined. Agree with above. The patient presents today with PAF to undergo initiation of dofetilide. His QT and electrolytes are acceptable. He is in and out of rhythm. We will initiate dofetilide based on weight and renal function.   Carleene Overlie Vickie Ponds,MD

## 2022-06-01 NOTE — Progress Notes (Signed)
Pharmacy: Dofetilide (Tikosyn) - Initial Consult Assessment and Electrolyte Replacement  Pharmacy consulted to assist in monitoring and replacing electrolytes in this 82 y.o. male admitted on 06/01/2022 undergoing dofetilide initiation. First dofetilide dose: 250 mcg planned tonight at 8 pm  Assessment:  Patient Exclusion Criteria: If any screening criteria checked as "Yes", then  patient  should NOT receive dofetilide until criteria item is corrected.  If "Yes" please indicate correction plan.  YES  NO Patient  Exclusion Criteria Correction Plan   '[]'$   '[x]'$   Baseline QTc interval is greater than or equal to 440 msec. IF above YES box checked dofetilide contraindicated unless patient has ICD; then may proceed if QTc 500-550 msec or with known ventricular conduction abnormalities may proceed with QTc 550-600 msec. QTc =      '[]'$   '[x]'$   Patient is known or suspected to have a digoxin level greater than 2 ng/ml: No results found for: "DIGOXIN"     '[]'$   '[x]'$   Creatinine clearance less than 20 ml/min (calculated using Cockcroft-Gault, actual body weight and serum creatinine): Estimated Creatinine Clearance: 46.7 mL/min (A) (by C-G formula based on SCr of 1.43 mg/dL (H)).     '[]'$   '[x]'$  Patient has received drugs known to prolong the QT intervals within the last 48 hours (phenothiazines, tricyclics or tetracyclic antidepressants, erythromycin, H-1 antihistamines, cisapride, fluoroquinolones, azithromycin, ondansetron).   Updated information on QT prolonging agents is available to be searched on the following database:QT prolonging agents     '[]'$   '[x]'$   Patient received a dose of hydrochlorothiazide (Oretic) alone or in any combination including triamterene (Dyazide, Maxzide) in the last 48 hours.    '[]'$   '[x]'$  Patient received a medication known to increase dofetilide plasma concentrations prior to initial dofetilide dose:  Trimethoprim (Primsol, Proloprim) in the last 36 hours Verapamil (Calan,  Verelan) in the last 36 hours or a sustained release dose in the last 72 hours Megestrol (Megace) in the last 5 days  Cimetidine (Tagamet) in the last 6 hours Ketoconazole (Nizoral) in the last 24 hours Itraconazole (Sporanox) in the last 48 hours  Prochlorperazine (Compazine) in the last 36 hours     '[]'$   '[x]'$   Patient is known to have a history of torsades de pointes; congenital or acquired long QT syndromes.    '[]'$   '[x]'$   Patient has received a Class 1 antiarrhythmic with less than 2 half-lives since last dose. (Disopyramide, Quinidine, Procainamide, Lidocaine, Mexiletine, Flecainide, Propafenone)    '[]'$   '[x]'$   Patient has received amiodarone therapy in the past 3 months or amiodarone level is greater than 0.3 ng/ml.    Labs:    Component Value Date/Time   K 3.9 06/01/2022 1046   MG 1.9 06/01/2022 1046     Plan: [facility] One Calculated CrCl  Dose q12h  '[]'$  > 60 ml/min 500 mcg  '[x]'$  40-60 ml/min 250 mcg  '[]'$  20-40 ml/min 125 mcg   '[x]'$   Physician selected initial dose within range recommended for patients level of renal function - will monitor for response.  '[]'$   Physician selected initial dose outside of range recommended for patients level of renal function - will discuss if the dose should be altered at this time.   Patient has been anticoagulated with Eliquis - was taking once daily prior to admission, but was in NSR this AM.  Reviewed with EP team, okay to proceed with tikosyn  Potassium: K 3.8-3.9:  Hold Tikosyn initiation and give KCl 40 mEq po x1 then begin  Tikosyn at least 2hr after KCl dose - do not need to recheck K   Magnesium: Mg 1.8-2: Give Mg 2 gm IV x1 to prevent Mg from dropping below 1.8 - do not need to recheck Mg. Appropriate to initiate Tikosyn   Thank you for allowing pharmacy to participate in this patient's care   [place] Crane, Roylene Reason, [facility] Clinical Pharmacist  06/01/2022 1:36 PM   [facility] pharmacy phone numbers are listed on [email or web address]

## 2022-06-01 NOTE — Progress Notes (Signed)
Primary Care Physician: System, Provider Not In Primary Cardiologist: Dr Domenic Polite Primary Electrophysiologist: Dr Lovena Le Referring Physician: Dr Alona Bene is a 82 y.o. male with a history of CAD, HTN, tachybradycardia syndrome s/p PPM, atrial fibrillation who presents for follow up in the [facility]. Patient noted to have a higher burden on afib on his PPM, >20% and Dr Lovena Le recommended dofetilide. Patient is on Eliquis for a CHADS2VASC score of 4.  On follow up today, patient reports that he frequently feels SOB and fatigued with exertion. He presents today for dofetilide loading. He has only been taking Eliquis once daily, fortunately he is in SR today.   Today, he denies symptoms of palpitations, chest pain, orthopnea, PND, lower extremity edema, dizziness, presyncope, syncope, snoring, daytime somnolence, bleeding, or neurologic sequela. The patient is tolerating medications without difficulties and is otherwise without complaint today.    Atrial Fibrillation Risk Factors:  he does not have symptoms or diagnosis of sleep apnea. he does not have a history of rheumatic fever.   he has a BMI of Body mass index is 29.65 kg/m.[name] Filed Weights   06/01/22 1046  Weight: 91.1 kg    Family History  Problem Relation Age of Onset   Lung cancer Father      Atrial Fibrillation Management history:  Previous antiarrhythmic drugs: none Previous cardioversions: none Previous ablations: none CHADS2VASC score: 4 Anticoagulation history: Eliquis   Past Medical History:  Diagnosis Date   CAD (coronary artery disease)    DES to mid LAD October 2018   Essential hypertension    NSTEMI (non-ST elevated myocardial infarction) ([place]) 01/2017   Paroxysmal atrial fibrillation ([place])    Tachycardia-bradycardia syndrome [facility]    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 Lovena Le   Past Surgical History:  Procedure Laterality Date   CORONARY STENT  INTERVENTION N/A 02/01/2017   Procedure: CORONARY STENT INTERVENTION;  Surgeon: Lorretta Harp, MD;  Location: [place] [facility];  Service: Cardiovascular;  Laterality: N/A;  LAD Prox-Mid lesion stented   LEFT HEART CATH AND CORONARY ANGIOGRAPHY N/A 02/01/2017   Procedure: LEFT HEART CATH AND CORONARY ANGIOGRAPHY;  Surgeon: Lorretta Harp, MD;  Location: [place] [facility];  Service: Cardiovascular;  Laterality: N/A;   PACEMAKER IMPLANT N/A 09/09/2020   Procedure: PACEMAKER IMPLANT;  Surgeon: Evans Lance, MD;  Location: [place] [facility];  Service: Cardiovascular;  Laterality: N/A;    Current Outpatient Medications  Medication Sig Dispense Refill   amLODipine (NORVASC) 10 MG tablet TAKE 1 TABLET BY MOUTH EVERY DAY 90 tablet 2   apixaban (ELIQUIS) 5 MG TABS tablet TAKE 1 TABLET BY MOUTH TWICE A DAY 180 tablet 1   atorvastatin (LIPITOR) 40 MG tablet Take 1 tablet (40 mg total) by mouth daily. 90 tablet 2   baclofen (LIORESAL) 10 MG tablet TAKE 1 TABLET BY MOUTH EVERY DAY AS NEEDED FOR UP TO 30 DAYS     furosemide (LASIX) 20 MG tablet Take 1 tablet (20 mg total) by mouth daily. 90 tablet 3   HYDROcodone-acetaminophen (NORCO/VICODIN) 5-325 MG tablet Take 1 tablet by mouth every 6 (six) hours as needed.     losartan (COZAAR) 50 MG tablet Take 1 tablet (50 mg total) by mouth daily. 90 tablet 2   metoprolol succinate (TOPROL-XL) 25 MG 24 hr tablet Take 1 tablet (25 mg total) by mouth daily. 90 tablet 1   nitroGLYCERIN (NITROSTAT) 0.4 MG SL tablet Place 1 tablet (  0.4 mg total) under the tongue every 5 (five) minutes x 3 doses as needed for chest pain. 10 tablet 0   No current facility-administered medications for this encounter.    Allergies  Allergen Reactions   Amoxicillin Itching    Social History   Socioeconomic History   Marital status: Married    Spouse name: Not on file   Number of children: Not on file   Years of education: Not on file   Highest education level: Not on  file  Occupational History   Not on file  Tobacco Use   Smoking status: Never   Smokeless tobacco: Never  Vaping Use   Vaping Use: Never used  Substance and Sexual Activity   Alcohol use: Yes    Comment: Occasional   Drug use: No   Sexual activity: Not on file  Other Topics Concern   Not on file  Social History Narrative   Not on file   Social Determinants of Health   Financial Resource Strain: Not on file  Food Insecurity: Not on file  Transportation Needs: Not on file  Physical Activity: Not on file  Stress: Not on file  Social Connections: Not on file  Intimate Partner Violence: Not on file     ROS- All systems are reviewed and negative except as per the HPI above.  Physical Exam: Vitals:   06/01/22 1046  Weight: 91.1 kg  Height: '5\' 9"'$  (1.753 m)    GEN- The patient is a well appearing elderly male, alert and oriented x 3 today.   Head- normocephalic, atraumatic Eyes-  Sclera clear, conjunctiva pink Ears- hearing intact Oropharynx- clear Neck- supple  Lungs- Clear to ausculation bilaterally, normal work of breathing Heart- Regular rate and rhythm, no murmurs, rubs or gallops  GI- soft, NT, ND, + BS Extremities- no clubbing, cyanosis, or edema MS- no significant deformity or atrophy Skin- no rash or lesion Psych- euthymic mood, full affect Neuro- strength and sensation are intact  Wt Readings from Last 3 Encounters:  06/01/22 91.1 kg  04/22/22 90.3 kg  01/28/22 90.7 kg    EKG today demonstrates  SR Vent. rate 72 BPM PR interval 200 ms QRS duration 78 ms QT/QTcB 374/409 ms  Echo 08/12/21 demonstrated   1. Left ventricular ejection fraction, by estimation, is 65 to 70%. The  left ventricle has normal function. The left ventricle has no regional  wall motion abnormalities. Left ventricular diastolic parameters are  indeterminate. The average left ventricular global longitudinal strain is -20.0 %. The global longitudinal strain is normal.   2. Right  ventricular systolic function is normal. The right ventricular  size is normal. There is normal pulmonary artery systolic pressure.   3. The mitral valve was not well visualized. No evidence of mitral valve  regurgitation. No evidence of mitral stenosis.   4. The aortic valve is tricuspid. Aortic valve regurgitation is mild. No  aortic stenosis is present.   5. The inferior vena cava is normal in size with greater than 50%  respiratory variability, suggesting right atrial pressure of 3 mmHg.   Comparison(s): LVEF 60-65%.   Epic records are reviewed at length today  CHA2DS2-VASc Score = 4  [name] score is based upon: CHF History: 0 HTN History: 1 Diabetes History: 0 Stroke History: 0 Vascular Disease History: 1 Age Score: 2 Gender Score: 0       ASSESSMENT AND PLAN: 1. Paroxysmal Atrial Fibrillation (ICD10:  I48.0) [name] CHA2DS2-VASc score is 4, indicating a  4.8% annual risk of stroke.   Patient presents for dofetilide admission. Continue Eliquis 5 mg BID, stressed importance of taking as prescribed. Fortunately he is in SR today.  No recent benadryl use PharmD has screened medications QTc in SR 409 ms Labs today show creatinine at 1.43, K+ 3.9 and mag 1.9, CrCl calculated at 54 mL/min Continue Toprol 25 mg daily  2. Secondary Hypercoagulable State (ICD10:  D68.69) The patient is at significant risk for stroke/thromboembolism based upon his CHA2DS2-VASc Score of 4.  Continue Apixaban (Eliquis).   3. HTN Stable, no changes today.  4. CAD DES to LAD in 2018 On statin No anginal symptoms.  5. Tachybradycardia syndrome S/p PPM, followed by Dr Lovena Le and the device clinic.   To be admitted later today once a bed becomes available.    [facility] [address] [place], [place] [address] [phone] 06/01/2022 11:00 AM

## 2022-06-02 DIAGNOSIS — I480 Paroxysmal atrial fibrillation: Secondary | ICD-10-CM | POA: Diagnosis not present

## 2022-06-02 LAB — BASIC METABOLIC PANEL
Anion gap: 9 (ref 5–15)
BUN: 22 mg/dL (ref 8–23)
CO2: 22 mmol/L (ref 22–32)
Calcium: 8.5 mg/dL — ABNORMAL LOW (ref 8.9–10.3)
Chloride: 107 mmol/L (ref 98–111)
Creatinine, Ser: 1.25 mg/dL — ABNORMAL HIGH (ref 0.61–1.24)
GFR, Estimated: 59 mL/min — ABNORMAL LOW (ref >60)
Glucose, Bld: 101 mg/dL — ABNORMAL HIGH (ref 70–99)
Potassium: 4.2 mmol/L (ref 3.5–5.1)
Sodium: 138 mmol/L (ref 135–145)

## 2022-06-02 LAB — MAGNESIUM: Magnesium: 2.1 mg/dL (ref 1.7–2.4)

## 2022-06-02 NOTE — Progress Notes (Addendum)
   Electrophysiology Rounding Note  Patient Name: Calvin Cochran Date of Encounter: 06/02/2022  Primary Cardiologist: Rozann Lesches, MD  Electrophysiologist: Cristopher Peru, MD    Subjective   Pt in SR this AM on Tikosyn 250 mcg BID   QTc from EKG last pm shows stable QT at 426m; stable QTc at 4344m The patient is doing well today.  At this time, the patient denies chest pain, shortness of breath, or any new concerns.  Inpatient Medications    Scheduled Meds:  amLODipine  10 mg Oral Daily   apixaban  5 mg Oral BID   atorvastatin  40 mg Oral Daily   dofetilide  250 mcg Oral BID   furosemide  20 mg Oral Daily   losartan  50 mg Oral Daily   metoprolol succinate  25 mg Oral Daily   sodium chloride flush  3 mL Intravenous Q12H   Continuous Infusions:  sodium chloride     PRN Meds: sodium chloride, HYDROcodone-acetaminophen, nitroGLYCERIN, sodium chloride flush   Vital Signs    Vitals:   06/01/22 1619 06/01/22 2012 06/02/22 0542 06/02/22 0753  BP: 125/88 119/77 121/78 116/81  Pulse: 60  63 66  Resp: 17 18 18 18  $ Temp: 97.8 F (36.6 C) (!) 97.4 F (36.3 C) 98.3 F (36.8 C) 98 F (36.7 C)  TempSrc: Oral Oral Oral Oral  SpO2:   97%   Weight:      Height:        Intake/Output Summary (Last 24 hours) at 06/02/2022 0807 Last data filed at 06/01/2022 2015 Gross per 24 hour  Intake 243 ml  Output --  Net 243 ml   Filed Weights   06/01/22 1240  Weight: 91.1 kg    Physical Exam    GEN- NAD, A&O x 3. Normal affect.  Lungs- CTAB, Normal effort.  Heart- Regular rate and rhythm. No M/G/R GI- Soft, NT, ND Extremities- No clubbing, cyanosis, or edema Skin- no rash or lesion  Labs    CBC No results for input(s): "WBC", "NEUTROABS", "HGB", "HCT", "MCV", "PLT" in the last 72 hours. Basic Metabolic Panel Recent Labs    06/01/22 1046 06/02/22 0202  NA 142 138  K 3.9 4.2  CL 105 107  CO2 27 22  GLUCOSE 97 101*  BUN 21 22  CREATININE 1.43* 1.25*  CALCIUM  9.0 8.5*  MG 1.9 2.1    Telemetry    Intermittent Afib / SR, intermittent A paced, intermittent V paced Ventricular rates well controlled (personally reviewed)  Patient Profile     Calvin Cochran a [age].o. male with a past medical history significant for persistent atrial fibrillation.  They were admitted for tikosyn load.   Assessment & Plan    Persistent atrial fibrillation Pt converted to sinus rhythm on Tikosyn 250 mcg BID  Continue Eliquis Creatinine, ser  1.25* (02/27 0202) Magnesium  2.1 (02/27 0202) Potassium4.2 (02/27 0202) No electrolyte supplementation needed  Plan for home Thursday if QTc remains stable.   For questions or updates, please contact [place] consult [email or web address] for contact info under Cardiology/STEMI.  Signed, SuMamie LeversNP  06/02/2022, 8:07 AM   EP Attending  Patient seen and examined. Agree with above. He is maintaining NSR and his QT is acceptable. Continue dofetilide load.  GrCarleene Overlieaylor,MD

## 2022-06-02 NOTE — Progress Notes (Signed)
Pharmacy: Dofetilide (Tikosyn) - Follow Up Assessment and Electrolyte Replacement  Pharmacy consulted to assist in monitoring and replacing electrolytes in this 82 y.o. male admitted on 06/01/2022 undergoing dofetilide initiation.   Labs:    Component Value Date/Time   K 4.2 06/02/2022 0202   MG 2.1 06/02/2022 0202     Plan: Potassium: K >/= 4: No additional supplementation needed  Magnesium: Mg > 2: No additional supplementation needed   Thank you for allowing pharmacy to participate in this patient's care   Hildred Laser, PharmD Clinical Pharmacist **Pharmacist phone directory can now be found on [email or web address] ([email or web address]).  Listed under [place].

## 2022-06-02 NOTE — Progress Notes (Signed)
Morning EKG reviewed     Shows remains in NSR at 60 bpm with stable QTc at 420 ms.  Continue  Tikosyn 250 mcg BID.   Potassium4.2 (02/27 0202) Magnesium  2.1 (02/27 0202) Creatinine, ser  1.25* (02/27 0202)  Plan for home Thursday if QTc remains stable   Mamie Levers, NP  06/02/2022 1:45 PM

## 2022-06-03 DIAGNOSIS — I480 Paroxysmal atrial fibrillation: Secondary | ICD-10-CM | POA: Diagnosis not present

## 2022-06-03 LAB — BASIC METABOLIC PANEL
Anion gap: 6 (ref 5–15)
BUN: 20 mg/dL (ref 8–23)
CO2: 24 mmol/L (ref 22–32)
Calcium: 8.6 mg/dL — ABNORMAL LOW (ref 8.9–10.3)
Chloride: 107 mmol/L (ref 98–111)
Creatinine, Ser: 1.16 mg/dL (ref 0.61–1.24)
GFR, Estimated: >60 mL/min (ref >60)
Glucose, Bld: 95 mg/dL (ref 70–99)
Potassium: 4 mmol/L (ref 3.5–5.1)
Sodium: 137 mmol/L (ref 135–145)

## 2022-06-03 LAB — MAGNESIUM: Magnesium: 1.9 mg/dL (ref 1.7–2.4)

## 2022-06-03 MED ORDER — POTASSIUM CHLORIDE CRYS ER 20 MEQ PO TBCR
20.0000 meq | EXTENDED_RELEASE_TABLET | Freq: Once | ORAL | Status: AC
  Administered 2022-06-03: 20 meq via ORAL
  Filled 2022-06-03: qty 1

## 2022-06-03 MED ORDER — MAGNESIUM SULFATE 2 GM/50ML IV SOLN
2.0000 g | Freq: Once | INTRAVENOUS | Status: AC
  Administered 2022-06-03: 2 g via INTRAVENOUS

## 2022-06-03 NOTE — Care Management (Signed)
06-03-22 1237 Case Manager spoke with the patient regarding Tikosyn co pay cost. Patient is agreeable to cost and would like to have the initial Rx filled via [facility] and the Rx refills 30 day supply escribed to [facility] on [address] in [place]. No further needs identified at this time.

## 2022-06-03 NOTE — Progress Notes (Signed)
Pharmacy: Dofetilide (Tikosyn) - Follow Up Assessment and Electrolyte Replacement  Pharmacy consulted to assist in monitoring and replacing electrolytes in this 82 y.o. male admitted on 06/01/2022 undergoing dofetilide initiation.   Labs:    Component Value Date/Time   K 4.0 06/03/2022 0201   MG 1.9 06/03/2022 0201     Plan: Potassium: -Kdur 22mq already ordered  Magnesium: -Mg 2gm IV ordered   Thank you for allowing pharmacy to participate in this patient's care   AHildred Laser PharmD Clinical Pharmacist **Pharmacist phone directory can now be found on [email or web address] ([email or web address]).  Listed under [place]

## 2022-06-03 NOTE — Progress Notes (Signed)
Morning EKG reviewed     Shows remains in NSR bpm with stable QTc at ~440-450 ms.  Continue  Tikosyn 250 mcg BID.   Potassium4.0 (02/28 0201) Magnesium  1.9 (02/28 0201) Creatinine, ser  1.16 (02/28 0201)  Plan for home Thursday if QTc remains stable   Devantae Sellman, [place]  06/03/2022 11:06 AM

## 2022-06-03 NOTE — Progress Notes (Addendum)
   Electrophysiology Rounding Note  Patient Name: Calvin Cochran Date of Encounter: 06/03/2022  Primary Cardiologist: Rozann Lesches, MD  Electrophysiologist: Cristopher Peru, MD    Subjective   Pt remains in NSR on Tikosyn 250 mcg BID   QTc from EKG last pm shows stable QTc at ~460  The patient is doing well today.  At this time, the patient denies chest pain, shortness of breath, or any new concerns.  Inpatient Medications    Scheduled Meds:  amLODipine  10 mg Oral Daily   apixaban  5 mg Oral BID   atorvastatin  40 mg Oral Daily   dofetilide  250 mcg Oral BID   furosemide  20 mg Oral Daily   losartan  50 mg Oral Daily   metoprolol succinate  25 mg Oral Daily   sodium chloride flush  3 mL Intravenous Q12H   Continuous Infusions:  sodium chloride     PRN Meds: sodium chloride, HYDROcodone-acetaminophen, nitroGLYCERIN, sodium chloride flush   Vital Signs    Vitals:   06/02/22 1400 06/02/22 1705 06/02/22 2007 06/03/22 0632  BP: 123/70 119/74 117/75 123/71  Pulse: 62 60 60   Resp: 16 18 18   $ Temp: 98.1 F (36.7 C) 98 F (36.7 C) 98 F (36.7 C) 98.1 F (36.7 C)  TempSrc: Oral Oral Oral Oral  SpO2:  98% 96% 96%  Weight:      Height:        Intake/Output Summary (Last 24 hours) at 06/03/2022 0801 Last data filed at 06/02/2022 2000 Gross per 24 hour  Intake 960 ml  Output --  Net 960 ml   Filed Weights   06/01/22 1240  Weight: 91.1 kg    Physical Exam    GEN- NAD, A&O x 3. Normal affect.  Lungs- CTAB, Normal effort.  Heart- Regular rate and rhythm. No M/G/R GI- Soft, NT, ND Extremities- No clubbing, cyanosis, or edema Skin- no rash or lesion  Labs    CBC No results for input(s): "WBC", "NEUTROABS", "HGB", "HCT", "MCV", "PLT" in the last 72 hours. Basic Metabolic Panel Recent Labs    06/02/22 0202 06/03/22 0201  NA 138 137  K 4.2 4.0  CL 107 107  CO2 22 24  GLUCOSE 101* 95  BUN 22 20  CREATININE 1.25* 1.16  CALCIUM 8.5* 8.6*  MG 2.1 1.9     Telemetry    NSR 60s (personally reviewed)  Patient Profile     Calvin Cochran is a 82 y.o. male with a past medical history significant for persistent atrial fibrillation.  They were admitted for tikosyn load.   Assessment & Plan    Persistent atrial fibrillation Pt remains in NSR on Tikosyn 250 mcg BID  Continue Eliquis Creatinine, ser  1.16 (02/28 0201) Magnesium  1.9 (02/28 0201) Potassium4.0 (02/28 0201) Supplement both K and Mg per protocol  Plan for home Thursday if QTc remains stable.   For questions or updates, please contact [place] Please consult [email or web address] for contact info under Cardiology/STEMI.  Signed, Shirley Friar, PA-C  06/03/2022, 8:01 AM   EP Attending  Patient seen and examined. Agree with above. The patient is doing well and he is maintained NSR. He will continue dofetilide. His QT is stable.   Carleene Overlie Aanvi Voyles,MD

## 2022-06-04 ENCOUNTER — Other Ambulatory Visit (HOSPITAL_COMMUNITY): Payer: Self-pay

## 2022-06-04 LAB — BASIC METABOLIC PANEL
Anion gap: 7 (ref 5–15)
BUN: 17 mg/dL (ref 8–23)
CO2: 24 mmol/L (ref 22–32)
Calcium: 8.8 mg/dL — ABNORMAL LOW (ref 8.9–10.3)
Chloride: 106 mmol/L (ref 98–111)
Creatinine, Ser: 1.27 mg/dL — ABNORMAL HIGH (ref 0.61–1.24)
GFR, Estimated: 57 mL/min — ABNORMAL LOW (ref >60)
Glucose, Bld: 91 mg/dL (ref 70–99)
Potassium: 4.2 mmol/L (ref 3.5–5.1)
Sodium: 137 mmol/L (ref 135–145)

## 2022-06-04 LAB — MAGNESIUM: Magnesium: 2.2 mg/dL (ref 1.7–2.4)

## 2022-06-04 MED ORDER — APIXABAN 5 MG PO TABS
5.0000 mg | ORAL_TABLET | Freq: Two times a day (BID) | ORAL | 1 refills | Status: DC
  Filled 2022-06-04: qty 60, 30d supply, fill #0

## 2022-06-04 MED ORDER — DOFETILIDE 250 MCG PO CAPS
250.0000 ug | ORAL_CAPSULE | Freq: Two times a day (BID) | ORAL | 11 refills | Status: DC
  Filled 2022-06-04: qty 60, 30d supply, fill #0

## 2022-06-04 NOTE — Progress Notes (Signed)
Pharmacy: Dofetilide (Tikosyn) - Follow Up Assessment and Electrolyte Replacement  Pharmacy consulted to assist in monitoring and replacing electrolytes in this 82 y.o. male admitted on 06/01/2022 undergoing dofetilide initiation.   Labs:    Component Value Date/Time   K 4.2 06/04/2022 0144   MG 2.2 06/04/2022 0144     Plan: Potassium: K >/= 4: No additional supplementation needed  Magnesium: Mg > 2: No additional supplementation needed  I anticipate no need for a potassium supplement at discharge  Thank you for allowing pharmacy to participate in this patient's care   Hildred Laser, PharmD Clinical Pharmacist **Pharmacist phone directory can now be found on [email or web address] ([email or web address]).  Listed under [place].

## 2022-06-04 NOTE — Discharge Summary (Addendum)
ELECTROPHYSIOLOGY PROCEDURE DISCHARGE SUMMARY    Patient ID: Calvin Cochran,  MRN: [id number], DOB/AGE: 06/02/1940 82 y.o.  Admit date: 06/01/2022 Discharge date: 06/04/2022  Primary Care Physician: System, Provider Not In  Primary Cardiologist: Rozann Lesches, MD  Electrophysiologist: Dr. Lovena Le   Primary Discharge Diagnosis:  1.  Paroxysmal atrial fibrillation status post Tikosyn loading this admission  Secondary Discharge Diagnosis:  HTN CAD Tachy-brady s/p PPM  Allergies  Allergen Reactions   Amoxicillin Itching     Procedures This Admission:  1.  Tikosyn loading   Brief HPI: Calvin Cochran is a 82 y.o. male with a past medical history as noted above.  They were referred to EP for treatment options of atrial fibrillation.  Risks, benefits, and alternatives to Tikosyn were reviewed with the patient who wished to proceed with admission for loading.  Hospital Course:  The patient was admitted and Tikosyn was initiated.  Renal function and electrolytes were followed during the hospitalization.  Their QTc remained stable. The patient converted chemically and did not require cardioversion. The patients QTc remained stable. They were monitored on telemetry up to discharge. On the day of discharge, they were examined by Dr. Lovena Le  who considered them stable for discharge to home.  Follow-up has been arranged with the Atrial Fibrillation clinic in approximately 1 week.   Physical Exam: Vitals:   06/03/22 1514 06/03/22 2005 06/04/22 0515 06/04/22 0800  BP: 116/72 132/75 110/70 120/72  Pulse: 60 61 61 67  Resp: '16  19 15  '$ Temp: 98 F (36.7 C) 98 F (36.7 C) 97.8 F (36.6 C) (!) 97.5 F (36.4 C)  TempSrc: Oral Oral Oral Oral  SpO2: 98% 97% 96% 97%  Weight:      Height:        GEN- NAD, A&O x 3. Normal affect.  Lungs- CTAB, Normal effort.  Heart- Regular rate and rhythm. No M/G/R GI- Soft, NT, ND Extremities- No clubbing, cyanosis, or edema Skin- no rash or  lesion  Labs:   Lab Results  Component Value Date   WBC 12.2 (H) 08/21/2020   HGB 14.0 08/21/2020   HCT 41.7 08/21/2020   MCV 90.7 08/21/2020   PLT 214 08/21/2020    Recent Labs  Lab 06/04/22 0144  NA 137  K 4.2  CL 106  CO2 24  BUN 17  CREATININE 1.27*  CALCIUM 8.8*  GLUCOSE 91    Discharge Medications:  Allergies as of 06/04/2022       Reactions   Amoxicillin Itching        Medication List     TAKE these medications    amLODipine 10 MG tablet Commonly known as: NORVASC TAKE 1 TABLET BY MOUTH EVERY DAY   apixaban 5 MG Tabs tablet Commonly known as: Eliquis Take 1 tablet (5 mg total) by mouth 2 (two) times daily.   atorvastatin 40 MG tablet Commonly known as: LIPITOR Take 1 tablet (40 mg total) by mouth daily.   dofetilide 250 MCG capsule Commonly known as: TIKOSYN Take 1 capsule (250 mcg total) by mouth 2 (two) times daily.   furosemide 20 MG tablet Commonly known as: Lasix Take 1 tablet (20 mg total) by mouth daily.   losartan 50 MG tablet Commonly known as: COZAAR Take 1 tablet (50 mg total) by mouth daily.   metoprolol succinate 25 MG 24 hr tablet Commonly known as: TOPROL-XL Take 1 tablet (25 mg total) by mouth daily.   nitroGLYCERIN 0.4 MG SL tablet Commonly known  as: NITROSTAT Place 1 tablet (0.4 mg total) under the tongue every 5 (five) minutes x 3 doses as needed for chest pain.        Disposition:    Follow-up Information     Fenton, Clint R, PA. Go in 1 week(s).   Specialty: Cardiology Why: March 7 at Contact information: [place] [address] [phone]                 Duration of Discharge Encounter: Greater than 30 minutes including physician time.  Signed, Mamie Levers, NP  06/04/2022 12:15 PM  EP Attending  Patient seen and examined. Agree with above. The patient is doing well and his QT is acceptable. He will continue his current meds and can be discharged home with usual followup.    Carleene Overlie Fahad Cisse,MD

## 2022-06-04 NOTE — Progress Notes (Addendum)
EKG from yesterday evening 06/03/2022 reviewed     Shows remains in NSR at 60 bpm with stable QT at 440/454m, QTc at 440/480 ms.  Continue  Tikosyn 250 mcg BID.   Potassium4.2 (02/29 0144) Magnesium  2.2 (02/29 0144) Creatinine, ser  1.27* (02/29 0144)  Plan for home this afternoon if QTc remains stable.   SMamie Levers NP  06/04/2022 8:10 AM

## 2022-06-04 NOTE — Plan of Care (Signed)

## 2022-06-04 NOTE — Care Management Important Message (Signed)
Important Message  Patient Details  Name: Calvin Cochran MRN: [id number] Date of Birth: 09-26-1940   Medicare Important Message Given:  Yes     Shelda Altes 06/04/2022, 9:07 AM

## 2022-06-08 IMAGING — CR DG CHEST 2V
2 series · 2 of 2 positions shown · non-contrast
Comparison: 02/01/2017

CLINICAL DATA: Status post pacer placement

EXAM:
CHEST - 2 VIEW

[w chest pa]
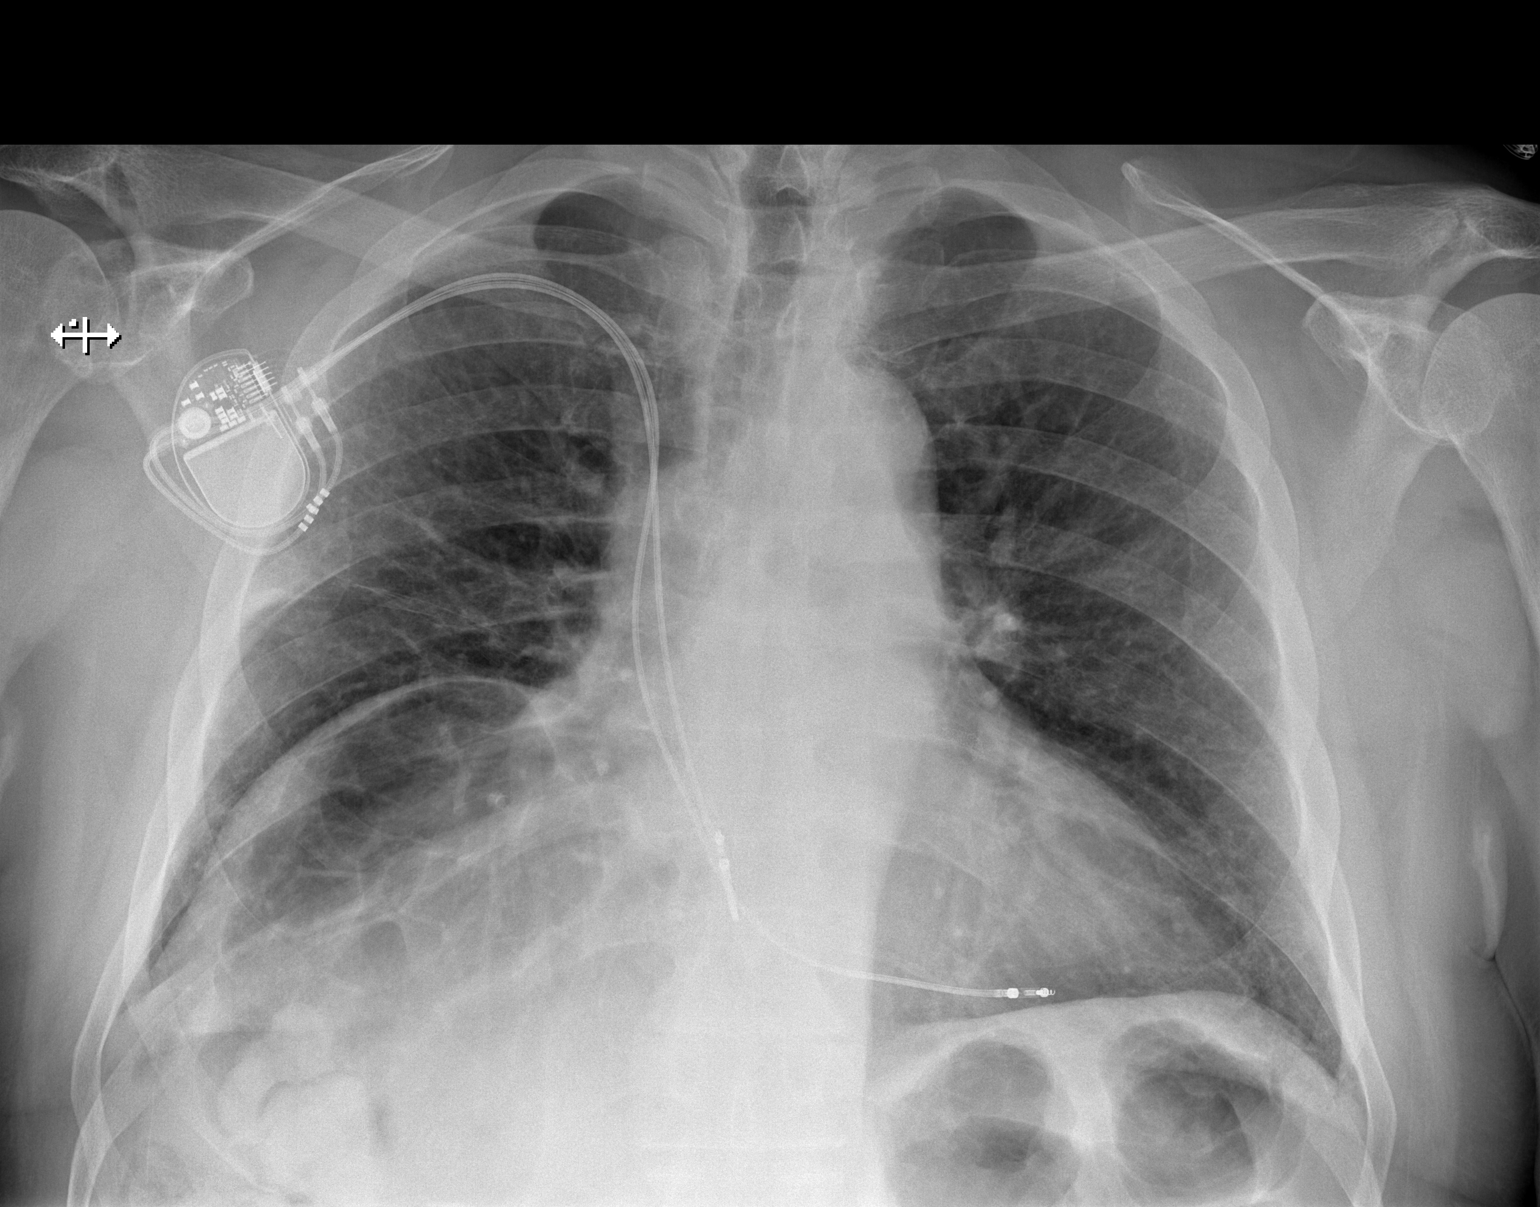

[w chest lat]
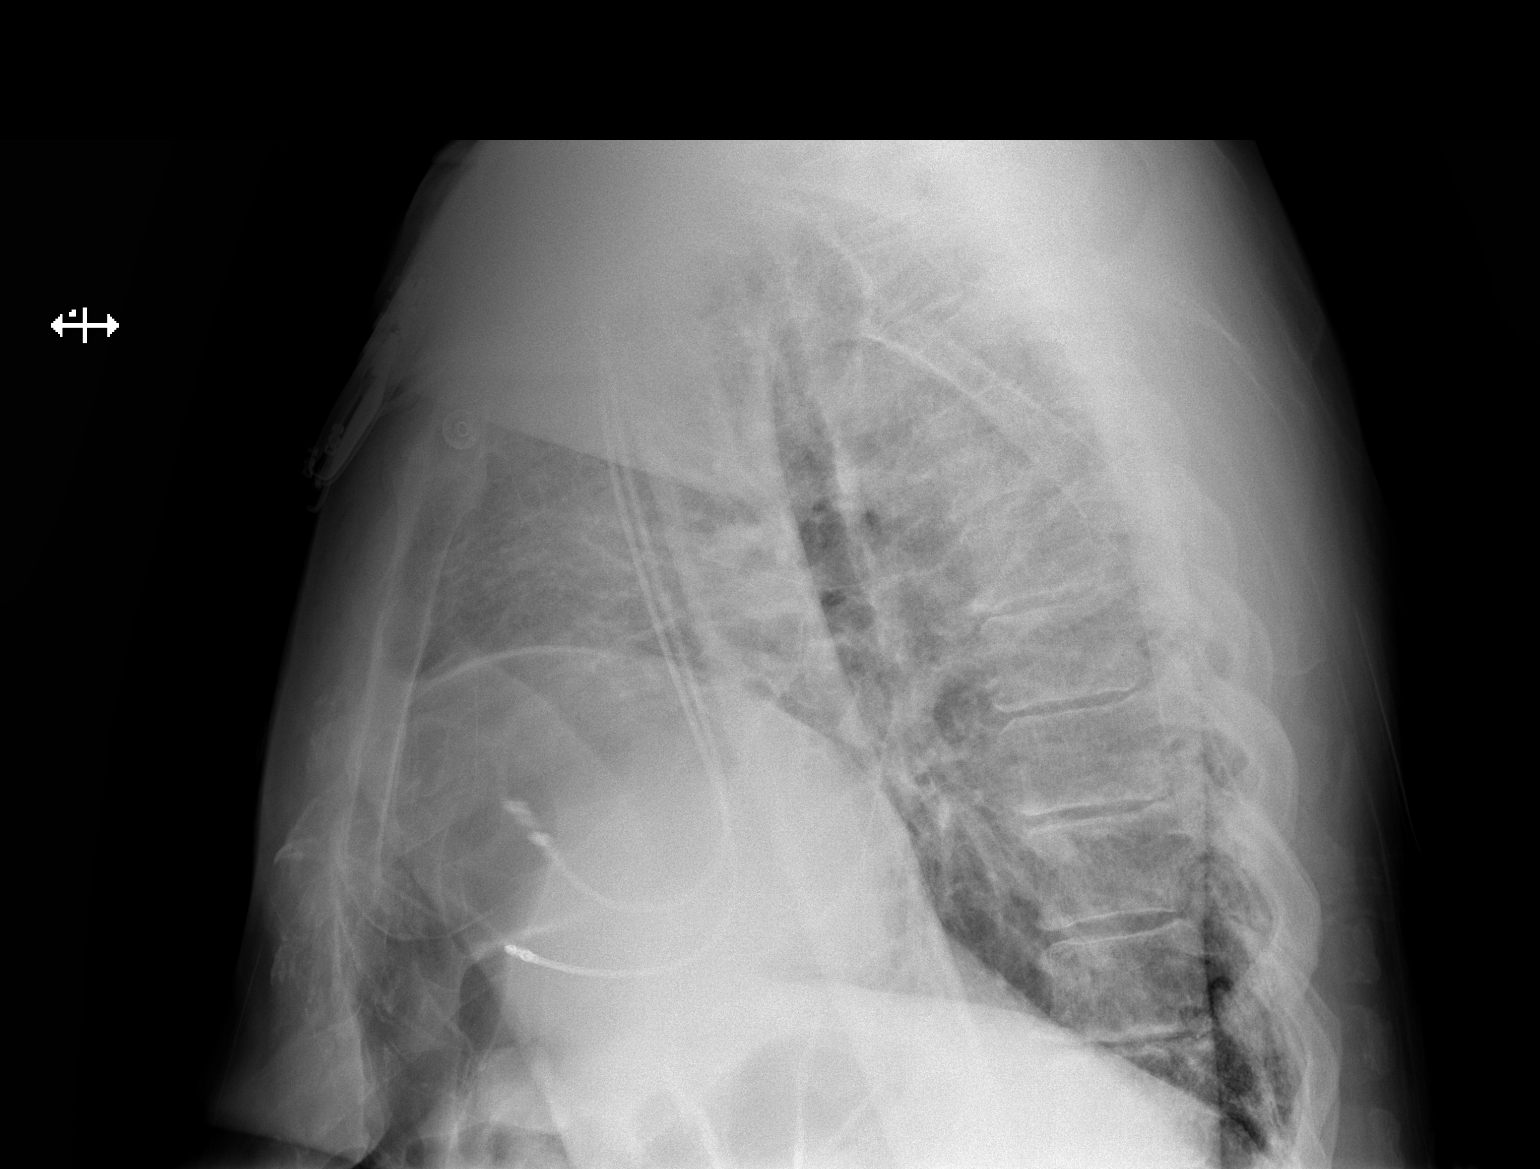

[2 of 2 positions shown; findings below may reference images not displayed]

FINDINGS: Interval placement of right chest wall pacer device with leads in
the right atrial appendage and right ventricle. No pneumothorax
identified after pacer placement. Heart size appears within normal
limits. Chronic asymmetric elevation of right hemidiaphragm
identified. No pleural effusion or edema.
IMPRESSION: Interval placement of right chest wall pacer device. No
pneumothorax.

## 2022-06-11 ENCOUNTER — Ambulatory Visit (HOSPITAL_COMMUNITY)
Admit: 2022-06-11 | Discharge: 2022-06-11 | Disposition: A | Discharge status: Home / Self Care | Payer: Medicare HMO | Attending: Physician Assistant | Admitting: Physician Assistant

## 2022-06-11 ENCOUNTER — Encounter (HOSPITAL_COMMUNITY): Payer: Self-pay | Admitting: Physician Assistant

## 2022-06-11 VITALS — BP 132/90 | HR 65 | Ht 69.0 in | Wt 199.0 lb

## 2022-06-11 DIAGNOSIS — Z955 Presence of coronary angioplasty implant and graft: Secondary | ICD-10-CM | POA: Insufficient documentation

## 2022-06-11 DIAGNOSIS — Z7901 Long term (current) use of anticoagulants: Secondary | ICD-10-CM | POA: Insufficient documentation

## 2022-06-11 DIAGNOSIS — I10 Essential (primary) hypertension: Secondary | ICD-10-CM | POA: Diagnosis not present

## 2022-06-11 DIAGNOSIS — Z5181 Encounter for therapeutic drug level monitoring: Secondary | ICD-10-CM | POA: Diagnosis not present

## 2022-06-11 DIAGNOSIS — I495 Sick sinus syndrome: Secondary | ICD-10-CM | POA: Diagnosis not present

## 2022-06-11 DIAGNOSIS — I2510 Atherosclerotic heart disease of native coronary artery without angina pectoris: Secondary | ICD-10-CM | POA: Diagnosis not present

## 2022-06-11 DIAGNOSIS — I480 Paroxysmal atrial fibrillation: Secondary | ICD-10-CM | POA: Diagnosis not present

## 2022-06-11 DIAGNOSIS — I491 Atrial premature depolarization: Secondary | ICD-10-CM | POA: Diagnosis not present

## 2022-06-11 DIAGNOSIS — D6869 Other thrombophilia: Secondary | ICD-10-CM | POA: Insufficient documentation

## 2022-06-11 DIAGNOSIS — Z79899 Other long term (current) drug therapy: Secondary | ICD-10-CM | POA: Insufficient documentation

## 2022-06-11 DIAGNOSIS — Z950 Presence of cardiac pacemaker: Secondary | ICD-10-CM | POA: Insufficient documentation

## 2022-06-11 LAB — BASIC METABOLIC PANEL
Anion gap: 9 (ref 5–15)
BUN: 20 mg/dL (ref 8–23)
CO2: 24 mmol/L (ref 22–32)
Calcium: 8.5 mg/dL — ABNORMAL LOW (ref 8.9–10.3)
Chloride: 107 mmol/L (ref 98–111)
Creatinine, Ser: 1.43 mg/dL — ABNORMAL HIGH (ref 0.61–1.24)
GFR, Estimated: 50 mL/min — ABNORMAL LOW (ref >60)
Glucose, Bld: 114 mg/dL — ABNORMAL HIGH (ref 70–99)
Potassium: 3.5 mmol/L (ref 3.5–5.1)
Sodium: 140 mmol/L (ref 135–145)

## 2022-06-11 LAB — MAGNESIUM: Magnesium: 1.9 mg/dL (ref 1.7–2.4)

## 2022-06-11 MED ORDER — DOFETILIDE 125 MCG PO CAPS: 125.0000 ug | ORAL_CAPSULE | Freq: Two times a day (BID) | ORAL | 3 refills | Status: DC

## 2022-06-11 MED ORDER — POTASSIUM CHLORIDE CRYS ER 20 MEQ PO TBCR: 20.0000 meq | EXTENDED_RELEASE_TABLET | Freq: Every day | ORAL | 3 refills | Status: DC

## 2022-06-11 NOTE — Progress Notes (Signed)
Primary Care Physician: System, Provider Not In Primary Cardiologist: Dr Domenic Polite Primary Electrophysiologist: Dr Lovena Le Referring Physician: Dr Alona Bene is a 82 y.o. male with a history of CAD, HTN, tachybradycardia syndrome s/p PPM, atrial fibrillation who presents for follow up in the [facility]. Patient noted to have a higher burden on afib on his PPM, >20% and Dr Lovena Le recommended dofetilide. Patient is on Eliquis for a CHADS2VASC score of 4.  On follow up today, patient is s/p dofetilide loading 2/26-2/29/24. Patient presented in [place]. Patient does report today that he feels less fatigue since his hospitalization. No bleeding issues on anticoagulation.   Today, he denies symptoms of palpitations, chest pain, orthopnea, PND, lower extremity edema, dizziness, presyncope, syncope, snoring, daytime somnolence, bleeding, or neurologic sequela. The patient is tolerating medications without difficulties and is otherwise without complaint today.    Atrial Fibrillation Risk Factors:  he does not have symptoms or diagnosis of sleep apnea. he does not have a history of rheumatic fever.   he has a BMI of Body mass index is 29.39 kg/m.[name] Filed Weights   06/11/22 1443  Weight: 90.3 kg    Family History  Problem Relation Age of Onset   Lung cancer Father     Atrial Fibrillation Management history:  Previous antiarrhythmic drugs: dofetilide  Previous cardioversions: none Previous ablations: none CHADS2VASC score: 4 Anticoagulation history: Eliquis   Past Medical History:  Diagnosis Date   CAD (coronary artery disease)    DES to mid LAD October 2018   Essential hypertension    NSTEMI (non-ST elevated myocardial infarction) ([place]) 01/2017   Paroxysmal atrial fibrillation ([place])    Tachycardia-bradycardia syndrome [facility])    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 Lovena Le   Past Surgical History:  Procedure Laterality Date   CORONARY STENT  INTERVENTION N/A 02/01/2017   Procedure: CORONARY STENT INTERVENTION;  Surgeon: Lorretta Harp, MD;  Location: [place] [facility];  Service: Cardiovascular;  Laterality: N/A;  LAD Prox-Mid lesion stented   LEFT HEART CATH AND CORONARY ANGIOGRAPHY N/A 02/01/2017   Procedure: LEFT HEART CATH AND CORONARY ANGIOGRAPHY;  Surgeon: Lorretta Harp, MD;  Location: [place] [facility];  Service: Cardiovascular;  Laterality: N/A;   PACEMAKER IMPLANT N/A 09/09/2020   Procedure: PACEMAKER IMPLANT;  Surgeon: Evans Lance, MD;  Location: [place] [facility];  Service: Cardiovascular;  Laterality: N/A;    Current Outpatient Medications  Medication Sig Dispense Refill   amLODipine (NORVASC) 10 MG tablet TAKE 1 TABLET BY MOUTH EVERY DAY 90 tablet 2   apixaban (ELIQUIS) 5 MG TABS tablet Take 1 tablet (5 mg total) by mouth 2 (two) times daily. 180 tablet 1   atorvastatin (LIPITOR) 40 MG tablet Take 1 tablet (40 mg total) by mouth daily. 90 tablet 2   dofetilide (TIKOSYN) 250 MCG capsule Take 1 capsule (250 mcg total) by mouth 2 (two) times daily. 60 capsule 11   furosemide (LASIX) 20 MG tablet Take 1 tablet (20 mg total) by mouth daily. 90 tablet 3   losartan (COZAAR) 50 MG tablet Take 1 tablet (50 mg total) by mouth daily. 90 tablet 2   metoprolol succinate (TOPROL-XL) 25 MG 24 hr tablet Take 1 tablet (25 mg total) by mouth daily. 90 tablet 1   nitroGLYCERIN (NITROSTAT) 0.4 MG SL tablet Place 1 tablet (0.4 mg total) under the tongue every 5 (five) minutes x 3 doses as needed for chest pain. 10 tablet 0  No current facility-administered medications for this encounter.    Allergies  Allergen Reactions   Amoxicillin Itching    Social History   Socioeconomic History   Marital status: Married    Spouse name: Not on file   Number of children: Not on file   Years of education: Not on file   Highest education level: Not on file  Occupational History   Not on file  Tobacco Use   Smoking status: Never    Smokeless tobacco: Never   Tobacco comments:    Never smoke 06/11/22  Vaping Use   Vaping Use: Never used  Substance and Sexual Activity   Alcohol use: Yes    Alcohol/week: 1.0 standard drink of alcohol    Types: 1 Shots of liquor per week    Comment: 1 shot of bourbon weekly 06/11/22   Drug use: No   Sexual activity: Not on file  Other Topics Concern   Not on file  Social History Narrative   Not on file   Social Determinants of Health   Financial Resource Strain: Not on file  Food Insecurity: No Food Insecurity (06/01/2022)   Hunger Vital Sign    Worried About Running Out of Food in the Last Year: Never true    Ran Out of Food in the Last Year: Never true  Transportation Needs: No Transportation Needs (06/01/2022)   PRAPARE - Hydrologist (Medical): No    Lack of Transportation (Non-Medical): No  Physical Activity: Not on file  Stress: Not on file  Social Connections: Not on file  Intimate Partner Violence: Not At Risk (06/01/2022)   Humiliation, Afraid, Rape, and Kick questionnaire    Fear of Current or Ex-Partner: No    Emotionally Abused: No    Physically Abused: No    Sexually Abused: No     ROS- All systems are reviewed and negative except as per the HPI above.  Physical Exam: Vitals:   06/11/22 1443  BP: (!) 132/90  Pulse: 65  Weight: 90.3 kg  Height: '5\' 9"'$  (1.753 m)     GEN- The patient is a well appearing elderly male, alert and oriented x 3 today.   HEENT-head normocephalic, atraumatic, sclera clear, conjunctiva pink, hearing intact, trachea midline. Lungs- Clear to ausculation bilaterally, normal work of breathing Heart- Regular rate and rhythm, no murmurs, rubs or gallops  GI- soft, NT, ND, + BS Extremities- no clubbing, cyanosis, or edema MS- no significant deformity or atrophy Skin- no rash or lesion Psych- euthymic mood, full affect Neuro- strength and sensation are intact   Wt Readings from Last 3 Encounters:   06/11/22 90.3 kg  06/01/22 91.1 kg  06/01/22 91.1 kg    EKG today demonstrates  A paced rhythm Vent. rate 65 BPM PR interval 212 ms QRS duration 72 ms QT/QTcB 482/501 ms  Echo 08/12/21 demonstrated   1. Left ventricular ejection fraction, by estimation, is 65 to 70%. The  left ventricle has normal function. The left ventricle has no regional  wall motion abnormalities. Left ventricular diastolic parameters are  indeterminate. The average left ventricular global longitudinal strain is -20.0 %. The global longitudinal strain is normal.   2. Right ventricular systolic function is normal. The right ventricular  size is normal. There is normal pulmonary artery systolic pressure.   3. The mitral valve was not well visualized. No evidence of mitral valve  regurgitation. No evidence of mitral stenosis.   4. The aortic valve is tricuspid.  Aortic valve regurgitation is mild. No  aortic stenosis is present.   5. The inferior vena cava is normal in size with greater than 50%  respiratory variability, suggesting right atrial pressure of 3 mmHg.   Comparison(s): LVEF 60-65%.   Epic records are reviewed at length today  CHA2DS2-VASc Score = 4  [name] score is based upon: CHF History: 0 HTN History: 1 Diabetes History: 0 Stroke History: 0 Vascular Disease History: 1 Age Score: 2 Gender Score: 0       ASSESSMENT AND PLAN: 1. Paroxysmal Atrial Fibrillation (ICD10:  I48.0) [name] CHA2DS2-VASc score is 4, indicating a 4.8% annual risk of stroke.   S/p dofetilide admission 2/26-2/29/24 Patient remains in [place] today.  QT measuring manually ~500 ms today, appears longer than when in the hospital. D/w EP, will reduce dose to 125 mcg BID. Recheck ECG early next week.  Check bmet/mag today.  Continue Eliquis 5 mg BID Continue Toprol 25 mg daily  2. Secondary Hypercoagulable State (ICD10:  D68.69) The patient is at significant risk for stroke/thromboembolism based upon his  CHA2DS2-VASc Score of 4.  Continue Apixaban (Eliquis).   3. HTN Stable, no changes today.  4. CAD DES to LAD in 2018 On statin No anginal symptoms.  5. Tachybradycardia syndrome S/p PPM, follow up with Dr Lovena Le and the device clinic.   Follow up next week for ECG and then one month for office visit.     [facility] [address] [place], [place] [address] [phone] 06/11/2022 2:56 PM

## 2022-06-12 ENCOUNTER — Other Ambulatory Visit (HOSPITAL_COMMUNITY): Payer: Self-pay | Admitting: *Deleted

## 2022-06-12 MED ORDER — DOFETILIDE 125 MCG PO CAPS: 125.0000 ug | ORAL_CAPSULE | Freq: Two times a day (BID) | ORAL | 3 refills | Status: DC

## 2022-06-17 ENCOUNTER — Ambulatory Visit (HOSPITAL_COMMUNITY)
Admission: RE | Admit: 2022-06-17 | Discharge: 2022-06-17 | Disposition: A | Discharge status: Home / Self Care | Payer: Medicare HMO | Source: Ambulatory Visit | Attending: Physician Assistant | Admitting: Physician Assistant

## 2022-06-17 DIAGNOSIS — Z79899 Other long term (current) drug therapy: Secondary | ICD-10-CM | POA: Diagnosis not present

## 2022-06-17 DIAGNOSIS — Z5181 Encounter for therapeutic drug level monitoring: Secondary | ICD-10-CM | POA: Insufficient documentation

## 2022-06-17 DIAGNOSIS — I480 Paroxysmal atrial fibrillation: Secondary | ICD-10-CM | POA: Diagnosis present

## 2022-06-17 LAB — BASIC METABOLIC PANEL
Anion gap: 10 (ref 5–15)
BUN: 13 mg/dL (ref 8–23)
CO2: 23 mmol/L (ref 22–32)
Calcium: 9.1 mg/dL (ref 8.9–10.3)
Chloride: 107 mmol/L (ref 98–111)
Creatinine, Ser: 1.23 mg/dL (ref 0.61–1.24)
GFR, Estimated: 60 mL/min — ABNORMAL LOW (ref >60)
Glucose, Bld: 113 mg/dL — ABNORMAL HIGH (ref 70–99)
Potassium: 3.9 mmol/L (ref 3.5–5.1)
Sodium: 140 mmol/L (ref 135–145)

## 2022-06-17 LAB — MAGNESIUM: Magnesium: 1.8 mg/dL (ref 1.7–2.4)

## 2022-06-17 NOTE — Progress Notes (Signed)
Patient returns for ECG after decreasing dofetilide. ECG shows:  SR, 1st degree AV block Vent. rate 64 BPM PR interval 202 ms QRS duration 80 ms QT/QTcB 394/406 ms  QT much improved on lower dose. Continue dofetilide 125 mcg BID. Follow up in AF clinic as scheduled.

## 2022-06-20 ENCOUNTER — Other Ambulatory Visit: Payer: Self-pay | Admitting: Cardiology

## 2022-07-03 ENCOUNTER — Ambulatory Visit (INDEPENDENT_AMBULATORY_CARE_PROVIDER_SITE_OTHER): Payer: Medicare HMO

## 2022-07-03 DIAGNOSIS — I495 Sick sinus syndrome: Secondary | ICD-10-CM | POA: Diagnosis not present

## 2022-07-03 LAB — CUP PACEART REMOTE DEVICE CHECK
Battery Remaining Longevity: 92 mo
Battery Remaining Percentage: 85 %
Battery Voltage: 3.01 V
Brady Statistic AP VP Percent: 1 %
Brady Statistic AP VS Percent: 45 %
Brady Statistic AS VP Percent: 1 %
Brady Statistic AS VS Percent: 53 %
Brady Statistic RA Percent Paced: 35 %
Brady Statistic RV Percent Paced: 7.8 %
Date Time Interrogation Session: 20240329032856
Implantable Lead Connection Status: 753985
Implantable Lead Connection Status: 753985
Implantable Lead Implant Date: 20220606
Implantable Lead Implant Date: 20220606
Implantable Lead Location: 753859
Implantable Lead Location: 753860
Implantable Pulse Generator Implant Date: 20220606
Lead Channel Impedance Value: 380 Ohm
Lead Channel Impedance Value: 400 Ohm
Lead Channel Pacing Threshold Amplitude: 0.5 V
Lead Channel Pacing Threshold Amplitude: 1.25 V
Lead Channel Pacing Threshold Pulse Width: 0.5 ms
Lead Channel Pacing Threshold Pulse Width: 0.5 ms
Lead Channel Sensing Intrinsic Amplitude: 12 mV
Lead Channel Sensing Intrinsic Amplitude: 2.1 mV
Lead Channel Setting Pacing Amplitude: 2 V
Lead Channel Setting Pacing Amplitude: 2.5 V
Lead Channel Setting Pacing Pulse Width: 0.5 ms
Lead Channel Setting Sensing Sensitivity: 2 mV
Pulse Gen Model: 2272
Pulse Gen Serial Number: 6460902

## 2022-07-15 ENCOUNTER — Ambulatory Visit (HOSPITAL_COMMUNITY)
Admission: RE | Admit: 2022-07-15 | Discharge: 2022-07-15 | Disposition: A | Discharge status: Home / Self Care | Payer: Medicare HMO | Source: Ambulatory Visit | Attending: Physician Assistant | Admitting: Physician Assistant

## 2022-07-15 ENCOUNTER — Encounter (HOSPITAL_COMMUNITY): Payer: Self-pay | Admitting: Physician Assistant

## 2022-07-15 VITALS — BP 132/76 | HR 60 | Ht 69.0 in | Wt 206.8 lb

## 2022-07-15 DIAGNOSIS — R0602 Shortness of breath: Secondary | ICD-10-CM | POA: Insufficient documentation

## 2022-07-15 DIAGNOSIS — D6869 Other thrombophilia: Secondary | ICD-10-CM | POA: Diagnosis not present

## 2022-07-15 DIAGNOSIS — Z7901 Long term (current) use of anticoagulants: Secondary | ICD-10-CM | POA: Insufficient documentation

## 2022-07-15 DIAGNOSIS — I2510 Atherosclerotic heart disease of native coronary artery without angina pectoris: Secondary | ICD-10-CM | POA: Diagnosis not present

## 2022-07-15 DIAGNOSIS — Z5181 Encounter for therapeutic drug level monitoring: Secondary | ICD-10-CM | POA: Diagnosis not present

## 2022-07-15 DIAGNOSIS — I495 Sick sinus syndrome: Secondary | ICD-10-CM | POA: Diagnosis not present

## 2022-07-15 DIAGNOSIS — Z79899 Other long term (current) drug therapy: Secondary | ICD-10-CM

## 2022-07-15 DIAGNOSIS — I480 Paroxysmal atrial fibrillation: Secondary | ICD-10-CM

## 2022-07-15 DIAGNOSIS — Z950 Presence of cardiac pacemaker: Secondary | ICD-10-CM | POA: Insufficient documentation

## 2022-07-15 DIAGNOSIS — I10 Essential (primary) hypertension: Secondary | ICD-10-CM | POA: Diagnosis not present

## 2022-07-15 DIAGNOSIS — I351 Nonrheumatic aortic (valve) insufficiency: Secondary | ICD-10-CM | POA: Insufficient documentation

## 2022-07-15 LAB — BASIC METABOLIC PANEL
Anion gap: 12 (ref 5–15)
BUN: 16 mg/dL (ref 8–23)
CO2: 23 mmol/L (ref 22–32)
Calcium: 9 mg/dL (ref 8.9–10.3)
Chloride: 105 mmol/L (ref 98–111)
Creatinine, Ser: 1.35 mg/dL — ABNORMAL HIGH (ref 0.61–1.24)
GFR, Estimated: 53 mL/min — ABNORMAL LOW (ref >60)
Glucose, Bld: 93 mg/dL (ref 70–99)
Potassium: 3.3 mmol/L — ABNORMAL LOW (ref 3.5–5.1)
Sodium: 140 mmol/L (ref 135–145)

## 2022-07-15 LAB — MAGNESIUM: Magnesium: 1.7 mg/dL (ref 1.7–2.4)

## 2022-07-15 MED ORDER — DOFETILIDE 125 MCG PO CAPS: 125.0000 ug | ORAL_CAPSULE | Freq: Two times a day (BID) | ORAL | 1 refills | Status: DC

## 2022-07-15 NOTE — Progress Notes (Signed)
Primary Care Physician: System, Provider Not In Primary Cardiologist: Dr Diona Browner Primary Electrophysiologist: Dr Ladona Ridgel Referring Physician: Dr [name] is a 82 y.o. male with a history of CAD, HTN, tachybradycardia syndrome s/p PPM, atrial fibrillation who presents for follow up in the [facility]. Patient noted to have a higher burden on afib on his PPM, >20% and Dr Ladona Ridgel recommended dofetilide. Patient is on Eliquis for a CHADS2VASC score of 4.  Patient is s/p dofetilide loading 2/26-2/29/24. His dose was decreased for QT prolongation at his visit on 06/11/22.  On follow up today, patient reports that he has done well since his last visit. He does have some SOB with exertion but admits he has not been able to be very active due to back issues. His PPM shows his afib burden has decreased since his hospitalization. No bleeding issues on anticoagulation.   Today, he denies symptoms of palpitations, chest pain, orthopnea, PND, lower extremity edema, dizziness, presyncope, syncope, snoring, daytime somnolence, bleeding, or neurologic sequela. The patient is tolerating medications without difficulties and is otherwise without complaint today.    Atrial Fibrillation Risk Factors:  he does not have symptoms or diagnosis of sleep apnea. he does not have a history of rheumatic fever.   he has a BMI of Body mass index is 30.54 kg/m.[name] Filed Weights   07/15/22 1417  Weight: 93.8 kg    Family History  Problem Relation Age of Onset   Lung cancer Father     Atrial Fibrillation Management history:  Previous antiarrhythmic drugs: dofetilide  Previous cardioversions: none Previous ablations: none CHADS2VASC score: 4 Anticoagulation history: Eliquis   Past Medical History:  Diagnosis Date   CAD (coronary artery disease)    DES to mid LAD October 2018   Essential hypertension    NSTEMI (non-ST elevated myocardial infarction) 01/2017    Paroxysmal atrial fibrillation    Tachycardia-bradycardia syndrome    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 Ladona Ridgel   Past Surgical History:  Procedure Laterality Date   CORONARY STENT INTERVENTION N/A 02/01/2017   Procedure: CORONARY STENT INTERVENTION;  Surgeon: Runell Gess, MD;  Location: [facility];  Service: Cardiovascular;  Laterality: N/A;  LAD Prox-Mid lesion stented   LEFT HEART CATH AND CORONARY ANGIOGRAPHY N/A 02/01/2017   Procedure: LEFT HEART CATH AND CORONARY ANGIOGRAPHY;  Surgeon: Runell Gess, MD;  Location: [facility];  Service: Cardiovascular;  Laterality: N/A;   PACEMAKER IMPLANT N/A 09/09/2020   Procedure: PACEMAKER IMPLANT;  Surgeon: Marinus Maw, MD;  Location: [facility];  Service: Cardiovascular;  Laterality: N/A;    Current Outpatient Medications  Medication Sig Dispense Refill   amLODipine (NORVASC) 10 MG tablet TAKE 1 TABLET BY MOUTH EVERY DAY 90 tablet 2   apixaban (ELIQUIS) 5 MG TABS tablet Take 1 tablet (5 mg total) by mouth 2 (two) times daily. 180 tablet 1   atorvastatin (LIPITOR) 40 MG tablet TAKE 1 TABLET BY MOUTH EVERY DAY 90 tablet 2   dofetilide (TIKOSYN) 125 MCG capsule Take 1 capsule (125 mcg total) by mouth 2 (two) times daily. 60 capsule 3   furosemide (LASIX) 20 MG tablet Take 1 tablet (20 mg total) by mouth daily. 90 tablet 3   losartan (COZAAR) 50 MG tablet TAKE 1 TABLET BY MOUTH EVERY DAY 90 tablet 2   metoprolol succinate (TOPROL-XL) 25 MG 24 hr tablet TAKE 1 TABLET (25 MG TOTAL) BY MOUTH DAILY. 90  tablet 1   nitroGLYCERIN (NITROSTAT) 0.4 MG SL tablet Place 1 tablet (0.4 mg total) under the tongue every 5 (five) minutes x 3 doses as needed for chest pain. 10 tablet 0   potassium chloride SA (KLOR-CON M) 20 MEQ tablet Take 1 tablet (20 mEq total) by mouth daily. 30 tablet 3   No current facility-administered medications for this encounter.    Allergies  Allergen Reactions   Amoxicillin Itching    Social  History   Socioeconomic History   Marital status: Married    Spouse name: Not on file   Number of children: Not on file   Years of education: Not on file   Highest education level: Not on file  Occupational History   Not on file  Tobacco Use   Smoking status: Never   Smokeless tobacco: Never   Tobacco comments:    Never smoke 06/11/22  Vaping Use   Vaping Use: Never used  Substance and Sexual Activity   Alcohol use: Yes    Alcohol/week: 1.0 standard drink of alcohol    Types: 1 Shots of liquor per week    Comment: 1 shot of bourbon weekly 06/11/22   Drug use: No   Sexual activity: Not on file  Other Topics Concern   Not on file  Social History Narrative   Not on file   Social Determinants of Health   Financial Resource Strain: Not on file  Food Insecurity: No Food Insecurity (06/01/2022)   Hunger Vital Sign    Worried About Running Out of Food in the Last Year: Never true    Ran Out of Food in the Last Year: Never true  Transportation Needs: No Transportation Needs (06/01/2022)   PRAPARE - Administrator, Civil Service (Medical): No    Lack of Transportation (Non-Medical): No  Physical Activity: Not on file  Stress: Not on file  Social Connections: Not on file  Intimate Partner Violence: Not At Risk (06/01/2022)   Humiliation, Afraid, Rape, and Kick questionnaire    Fear of Current or Ex-Partner: No    Emotionally Abused: No    Physically Abused: No    Sexually Abused: No     ROS- All systems are reviewed and negative except as per the HPI above.  Physical Exam: Vitals:   07/15/22 1417  BP: 132/76  Pulse: 60  Weight: 93.8 kg  Height: 5\' 9"  (1.753 m)    GEN- The patient is a well appearing elderly male, alert and oriented x 3 today.   HEENT-head normocephalic, atraumatic, sclera clear, conjunctiva pink, hearing intact, trachea midline. Lungs- Clear to ausculation bilaterally, normal work of breathing Heart- Regular rate and rhythm, no murmurs,  rubs or gallops  GI- soft, NT, ND, + BS Extremities- no clubbing, cyanosis, or edema MS- no significant deformity or atrophy Skin- no rash or lesion Psych- euthymic mood, full affect Neuro- strength and sensation are intact   Wt Readings from Last 3 Encounters:  07/15/22 93.8 kg  06/11/22 90.3 kg  06/01/22 91.1 kg    EKG today demonstrates  A paced rhythm Vent. rate 60 BPM PR interval 222 ms QRS duration 80 ms QT/QTcB 430/430 ms  Echo 08/12/21 demonstrated   1. Left ventricular ejection fraction, by estimation, is 65 to 70%. The  left ventricle has normal function. The left ventricle has no regional  wall motion abnormalities. Left ventricular diastolic parameters are  indeterminate. The average left ventricular global longitudinal strain is -20.0 %. The global longitudinal  strain is normal.   2. Right ventricular systolic function is normal. The right ventricular  size is normal. There is normal pulmonary artery systolic pressure.   3. The mitral valve was not well visualized. No evidence of mitral valve  regurgitation. No evidence of mitral stenosis.   4. The aortic valve is tricuspid. Aortic valve regurgitation is mild. No  aortic stenosis is present.   5. The inferior vena cava is normal in size with greater than 50%  respiratory variability, suggesting right atrial pressure of 3 mmHg.   Comparison(s): LVEF 60-65%.   Epic records are reviewed at length today  CHA2DS2-VASc Score = 4  [name] score is based upon: CHF History: 0 HTN History: 1 Diabetes History: 0 Stroke History: 0 Vascular Disease History: 1 Age Score: 2 Gender Score: 0       ASSESSMENT AND PLAN: 1. Paroxysmal Atrial Fibrillation (ICD10:  I48.0) [name] CHA2DS2-VASc score is 4, indicating a 4.8% annual risk of stroke. S/p dofetilide admission 2/26-2/29/24 Afib burden decreased on dofetilide. In SR today.  Continue dofetilide 125 mcg BID. QT stable.  Check bmet/mag today.  Continue  Eliquis 5 mg BID Continue Toprol 25 mg daily  2. Secondary Hypercoagulable State (ICD10:  D68.69) The patient is at significant risk for stroke/thromboembolism based upon his CHA2DS2-VASc Score of 4.  Continue Apixaban (Eliquis).   3. HTN Stable, no changes today.  4. CAD DES to LAD in 2018 On statin No anginal symptoms.  5. Tachybradycardia syndrome S/p PPM, followed by Dr Ladona Ridgelaylor and the device clinic.   Follow up in the AF clinic in 3 months.    Calvin Loaicky Kenyah Luba PA-C [facility] [address] [place], [place] [id number] [phone] 07/15/2022 2:26 PM

## 2022-07-15 NOTE — Addendum Note (Signed)
Encounter addended by: Shona Simpson, RN on: 07/15/2022 3:14 PM  Actions taken: Pharmacy for encounter modified, Order list changed

## 2022-07-17 ENCOUNTER — Other Ambulatory Visit (HOSPITAL_COMMUNITY): Payer: Self-pay | Admitting: *Deleted

## 2022-07-17 MED ORDER — MAGNESIUM OXIDE 400 MG PO CAPS: 400.0000 mg | ORAL_CAPSULE | Freq: Every day | ORAL | 3 refills | Status: DC

## 2022-07-17 MED ORDER — POTASSIUM CHLORIDE CRYS ER 20 MEQ PO TBCR: 20.0000 meq | EXTENDED_RELEASE_TABLET | Freq: Two times a day (BID) | ORAL | 3 refills | Status: DC

## 2022-07-31 ENCOUNTER — Other Ambulatory Visit (HOSPITAL_COMMUNITY): Payer: Self-pay | Admitting: *Deleted

## 2022-07-31 DIAGNOSIS — Z5181 Encounter for therapeutic drug level monitoring: Secondary | ICD-10-CM

## 2022-07-31 DIAGNOSIS — I480 Paroxysmal atrial fibrillation: Secondary | ICD-10-CM

## 2022-08-04 NOTE — Progress Notes (Signed)
Remote pacemaker transmission.   

## 2022-08-18 NOTE — Progress Notes (Unsigned)
Cardiology Office Note  Date: 08/19/2022   ID: Calvin Cochran, DOB May 22, 1940, MRN [id number]  History of Present Illness: Calvin Cochran is a 82 y.o. male last seen in October 2023.  He has had interval follow-up in the atrial fibrillation clinic, I reviewed the most recent note from April.  He reports no exertional chest pain, stable dyspnea on exertion generally at NYHA class II with most activities.  No definite sense of palpitations, no dizziness or syncope.  St. Jude pacemaker in place with followed by Dr. Ladona Ridgel.  He has follow-up scheduled soon.  ECG today shows an atrial paced rhythm.  His last device interrogation in March indicated 22% AF burden, hopefully less on next check now that he is on Tikosyn.  I reviewed the remainder of his medications, he reports no spontaneous bleeding problems on Eliquis.  Follow-up BMET is pending.  Physical Exam: VS:  BP 114/60   Pulse 60   Ht 5\' 9"  (1.753 m)   Wt 196 lb 12.8 oz (89.3 kg)   SpO2 99%   BMI 29.06 kg/m , BMI Body mass index is 29.06 kg/m.  Wt Readings from Last 3 Encounters:  08/19/22 196 lb 12.8 oz (89.3 kg)  07/15/22 206 lb 12.8 oz (93.8 kg)  06/11/22 199 lb (90.3 kg)    General: Patient appears comfortable at rest. HEENT: Conjunctiva and lids normal. Neck: Supple, no elevated JVP or carotid bruits. Lungs: Clear to auscultation, nonlabored breathing at rest. Cardiac: Regular rate and rhythm, no S3, 2/6 systolic murmur. Extremities: No pitting edema.  ECG:  An ECG dated 07/15/2022 was personally reviewed today and demonstrated:  Atrial paced rhythm with nonspecific ST changes, QTc 430 ms.  Labwork: April 2022: Cholesterol 191, triglycerides 106, HDL 64, LDL 106 07/15/2022: BUN 16; Creatinine, Ser 1.35; Magnesium 1.7; Potassium 3.3; Sodium 140   Other Studies Reviewed Today:  Echocardiogram 08/12/2021:  1. Left ventricular ejection fraction, by estimation, is 65 to 70%. The  left ventricle has normal function. The left  ventricle has no regional  wall motion abnormalities. Left ventricular diastolic parameters are  indeterminate. The average left  ventricular global longitudinal strain is -20.0 %. The global longitudinal  strain is normal.   2. Right ventricular systolic function is normal. The right ventricular  size is normal. There is normal pulmonary artery systolic pressure.   3. The mitral valve was not well visualized. No evidence of mitral valve  regurgitation. No evidence of mitral stenosis.   4. The aortic valve is tricuspid. Aortic valve regurgitation is mild. No  aortic stenosis is present.   5. The inferior vena cava is normal in size with greater than 50%  respiratory variability, suggesting right atrial pressure of 3 mmHg.   Assessment and Plan:  1.  CAD status post DES to the mid LAD in October 2018.  He reports no angina at this time, no interval nitroglycerin use.  Continue Toprol-XL and Lipitor.  2.  Paroxysmal atrial fibrillation with CHA2DS2-VASc score of 4.  He is now on Tikosyn for suppression of atrial fibrillation, initiated back in February.  Device interrogation in March indicated 22% AF burden.  He has follow-up with Dr. Ladona Ridgel soon, hopefully subsequent device interrogation shows improved arrhythmia control.  Continue Eliquis for stroke prophylaxis.  Follow-up BMET is pending.  He is on a potassium supplement along with his usual Lasix.  3.  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 Ladona Ridgel.  4.  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  LDL 106 in April 2022.  Encouraged him to get a follow-up FLP this year with PCP.  Disposition:  Follow up  6 months.  Signed, Jonelle Sidle, M.D., F.A.C.C.  [facility]

## 2022-08-19 ENCOUNTER — Encounter: Payer: Self-pay | Admitting: Cardiology

## 2022-08-19 ENCOUNTER — Ambulatory Visit: Payer: Medicare HMO | Attending: Cardiology | Admitting: Cardiology

## 2022-08-19 ENCOUNTER — Other Ambulatory Visit (HOSPITAL_COMMUNITY): Payer: Self-pay | Admitting: Physician Assistant

## 2022-08-19 VITALS — BP 114/60 | HR 60 | Ht 69.0 in | Wt 196.8 lb

## 2022-08-19 DIAGNOSIS — I480 Paroxysmal atrial fibrillation: Secondary | ICD-10-CM

## 2022-08-19 DIAGNOSIS — I25119 Atherosclerotic heart disease of native coronary artery with unspecified angina pectoris: Secondary | ICD-10-CM | POA: Diagnosis not present

## 2022-08-19 DIAGNOSIS — I495 Sick sinus syndrome: Secondary | ICD-10-CM

## 2022-08-19 NOTE — Patient Instructions (Signed)
Medication Instructions:  Your physician recommends that you continue on your current medications as directed. Please refer to the Current Medication list given to you today.   Labwork: None today  Testing/Procedures: None today  Follow-Up: 6 months Dr.McDowell  Any Other Special Instructions Will Be Listed Below (If Applicable).  If you need a refill on your cardiac medications before your next appointment, please call your pharmacy.  

## 2022-08-20 LAB — BASIC METABOLIC PANEL
BUN/Creatinine Ratio: 17 (ref 10–24)
BUN: 27 mg/dL (ref 8–27)
CO2: 20 mmol/L (ref 20–29)
Calcium: 9.2 mg/dL (ref 8.6–10.2)
Chloride: 102 mmol/L (ref 96–106)
Creatinine, Ser: 1.55 mg/dL — ABNORMAL HIGH (ref 0.76–1.27)
Glucose: 86 mg/dL (ref 70–99)
Potassium: 4.7 mmol/L (ref 3.5–5.2)
Sodium: 140 mmol/L (ref 134–144)
eGFR: 45 mL/min/{1.73_m2} — ABNORMAL LOW (ref >59)

## 2022-08-20 LAB — MAGNESIUM: Magnesium: 2.1 mg/dL (ref 1.6–2.3)

## 2022-08-29 ENCOUNTER — Encounter: Payer: Self-pay | Admitting: Cardiology

## 2022-09-16 ENCOUNTER — Other Ambulatory Visit (HOSPITAL_COMMUNITY): Payer: Self-pay | Admitting: Physician Assistant

## 2022-10-02 ENCOUNTER — Ambulatory Visit (INDEPENDENT_AMBULATORY_CARE_PROVIDER_SITE_OTHER): Payer: Medicare HMO

## 2022-10-02 DIAGNOSIS — I495 Sick sinus syndrome: Secondary | ICD-10-CM | POA: Diagnosis not present

## 2022-10-02 LAB — CUP PACEART REMOTE DEVICE CHECK
Battery Remaining Longevity: 89 mo
Battery Remaining Percentage: 83 %
Battery Voltage: 3.01 V
Brady Statistic AP VP Percent: 1 %
Brady Statistic AP VS Percent: 59 %
Brady Statistic AS VP Percent: 1 %
Brady Statistic AS VS Percent: 39 %
Brady Statistic RA Percent Paced: 52 %
Brady Statistic RV Percent Paced: 5.2 %
Date Time Interrogation Session: 20240628020019
Implantable Lead Connection Status: 753985
Implantable Lead Connection Status: 753985
Implantable Lead Implant Date: 20220606
Implantable Lead Implant Date: 20220606
Implantable Lead Location: 753859
Implantable Lead Location: 753860
Implantable Pulse Generator Implant Date: 20220606
Lead Channel Impedance Value: 400 Ohm
Lead Channel Impedance Value: 400 Ohm
Lead Channel Pacing Threshold Amplitude: 0.5 V
Lead Channel Pacing Threshold Amplitude: 1.25 V
Lead Channel Pacing Threshold Pulse Width: 0.5 ms
Lead Channel Pacing Threshold Pulse Width: 0.5 ms
Lead Channel Sensing Intrinsic Amplitude: 12 mV
Lead Channel Sensing Intrinsic Amplitude: 2 mV
Lead Channel Setting Pacing Amplitude: 2 V
Lead Channel Setting Pacing Amplitude: 2.5 V
Lead Channel Setting Pacing Pulse Width: 0.5 ms
Lead Channel Setting Sensing Sensitivity: 2 mV
Pulse Gen Model: 2272
Pulse Gen Serial Number: 6460902

## 2022-10-14 ENCOUNTER — Ambulatory Visit (HOSPITAL_COMMUNITY)
Admission: RE | Admit: 2022-10-14 | Discharge: 2022-10-14 | Disposition: A | Discharge status: Home / Self Care | Payer: Medicare HMO | Source: Ambulatory Visit | Attending: Physician Assistant | Admitting: Physician Assistant

## 2022-10-14 VITALS — BP 120/76 | HR 60 | Ht 69.0 in | Wt 202.8 lb

## 2022-10-14 DIAGNOSIS — Z955 Presence of coronary angioplasty implant and graft: Secondary | ICD-10-CM | POA: Insufficient documentation

## 2022-10-14 DIAGNOSIS — I2510 Atherosclerotic heart disease of native coronary artery without angina pectoris: Secondary | ICD-10-CM | POA: Insufficient documentation

## 2022-10-14 DIAGNOSIS — I351 Nonrheumatic aortic (valve) insufficiency: Secondary | ICD-10-CM | POA: Diagnosis not present

## 2022-10-14 DIAGNOSIS — I495 Sick sinus syndrome: Secondary | ICD-10-CM | POA: Insufficient documentation

## 2022-10-14 DIAGNOSIS — Z79899 Other long term (current) drug therapy: Secondary | ICD-10-CM | POA: Insufficient documentation

## 2022-10-14 DIAGNOSIS — Z950 Presence of cardiac pacemaker: Secondary | ICD-10-CM | POA: Insufficient documentation

## 2022-10-14 DIAGNOSIS — I10 Essential (primary) hypertension: Secondary | ICD-10-CM | POA: Diagnosis not present

## 2022-10-14 DIAGNOSIS — Z5181 Encounter for therapeutic drug level monitoring: Secondary | ICD-10-CM

## 2022-10-14 DIAGNOSIS — D6869 Other thrombophilia: Secondary | ICD-10-CM | POA: Diagnosis not present

## 2022-10-14 DIAGNOSIS — I480 Paroxysmal atrial fibrillation: Secondary | ICD-10-CM | POA: Insufficient documentation

## 2022-10-14 DIAGNOSIS — Z7901 Long term (current) use of anticoagulants: Secondary | ICD-10-CM | POA: Diagnosis not present

## 2022-10-14 LAB — MAGNESIUM: Magnesium: 2.2 mg/dL (ref 1.7–2.4)

## 2022-10-14 LAB — BASIC METABOLIC PANEL
Anion gap: 9 (ref 5–15)
BUN: 29 mg/dL — ABNORMAL HIGH (ref 8–23)
CO2: 24 mmol/L (ref 22–32)
Calcium: 8.8 mg/dL — ABNORMAL LOW (ref 8.9–10.3)
Chloride: 107 mmol/L (ref 98–111)
Creatinine, Ser: 2.04 mg/dL — ABNORMAL HIGH (ref 0.61–1.24)
GFR, Estimated: 32 mL/min — ABNORMAL LOW (ref >60)
Glucose, Bld: 82 mg/dL (ref 70–99)
Potassium: 4.4 mmol/L (ref 3.5–5.1)
Sodium: 140 mmol/L (ref 135–145)

## 2022-10-14 LAB — CBC
HCT: 46 % (ref 39.0–52.0)
Hemoglobin: 14.9 g/dL (ref 13.0–17.0)
MCH: 30.6 pg (ref 26.0–34.0)
MCHC: 32.4 g/dL (ref 30.0–36.0)
MCV: 94.5 fL (ref 80.0–100.0)
Platelets: 184 10*3/uL (ref 150–400)
RBC: 4.87 MIL/uL (ref 4.22–5.81)
RDW: 14.2 % (ref 11.5–15.5)
WBC: 10.9 10*3/uL — ABNORMAL HIGH (ref 4.0–10.5)
nRBC: 0 % (ref 0.0–0.2)

## 2022-10-14 NOTE — Progress Notes (Signed)
Primary Care Physician: System, Provider Not In Primary Cardiologist: Dr Diona Browner Primary Electrophysiologist: Dr Ladona Ridgel Referring Physician: Dr [name] is a 82 y.o. male with a history of CAD, HTN, tachybradycardia syndrome s/p PPM, atrial fibrillation who presents for follow up in the [facility]rial Fibr[facility]. Patient noted to have a higher burden on afib on his PPM, >20% and Dr Ladona Ridgel recommended dofetilide. Patient is on Eliquis for a CHADS2VASC score of 4.  Patient is s/p dofetilide loading 2/26-2/29/24. His dose was decreased for QT prolongation at his visit on 06/11/22.  On follow up today, patient reports that he had done well since his last visit. His afib burden has remained low on his device interrogation. No bleeding issues on anticoagulation.   Today, he denies symptoms of palpitations, chest pain, orthopnea, PND, lower extremity edema, dizziness, presyncope, syncope, snoring, daytime somnolence, bleeding, or neurologic sequela. The patient is tolerating medications without difficulties and is otherwise without complaint today.    Atrial Fibrillation Risk Factors:  he does not have symptoms or diagnosis of sleep apnea. he does not have a history of rheumatic fever.   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 Ladona Ridgel    ROS- All systems are reviewed and negative except as per the HPI above.  Physical Exam: Vitals:   10/14/22 1337  BP: 120/76  Pulse: 60  Weight: 92 kg  Height: 5\' 9"  (1.753 m)     GEN: Well nourished, well developed in no acute distress NECK: No JVD; No carotid bruits CARDIAC: Regular rate and rhythm, no murmurs, rubs, gallops RESPIRATORY:  Clear to auscultation without rales, wheezing or rhonchi  ABDOMEN: Soft, non-tender, non-distended EXTREMITIES:  No edema; No deformity    Wt Readings from Last 3 Encounters:  10/14/22 92 kg  08/19/22 89.3 kg  07/15/22 93.8 kg    EKG today demonstrates  A paced rhythm  Vent. rate 60 BPM PR interval 228 ms QRS duration 66 ms QT/QTcB 412/412 ms  Echo 08/12/21 demonstrated   1. Left ventricular ejection fraction, by estimation, is 65 to 70%. The  left ventricle has normal function. The left ventricle has no regional  wall motion abnormalities. Left ventricular diastolic parameters are  indeterminate. The average left ventricular global longitudinal strain is -20.0 %. The global longitudinal strain is normal.   2. Right ventricular systolic function is normal. The right ventricular  size is normal. There is normal pulmonary artery systolic pressure.   3. The mitral valve was not well visualized. No evidence of mitral valve  regurgitation. No evidence of mitral stenosis.   4. The aortic valve is tricuspid. Aortic valve regurgitation is mild. No  aortic stenosis is present.   5. The inferior vena cava is normal in size with greater than 50%  respiratory variability, suggesting right atrial pressure of 3 mmHg.   Comparison(s): LVEF 60-65%.   Epic records are reviewed at length today  CHA2DS2-VASc Score = 4  [name] score is based upon: CHF History: 0 HTN History: 1 Diabetes History: 0 Stroke History: 0 Vascular Disease  History: 1 Age Score: 2 Gender Score: 0       ASSESSMENT AND PLAN: Paroxysmal Atrial Fibrillation (ICD10:  I48.0) [name] CHA2DS2-VASc score is 4, indicating a 4.8% annual risk of stroke. S/p dofetilide admission 2/26-2/29/24 Afib burden continues to trend down 28% > 22% >  12% Continue dofetilide 125 mcg BID. QT stable Check bmet/mag today Continue Eliquis 5 mg BID Continue Toprol 25 mg daily  Secondary Hypercoagulable State (ICD10:  D68.69) The patient is at significant risk for stroke/thromboembolism based upon his CHA2DS2-VASc Score of 4.  Continue Apixaban (Eliquis).   HTN Stable on current regimen  CAD DES to LAD in 2018 On statin No anginal symptoms  Tachybradycardia syndrome S/p PPM, followed by Dr Ladona Ridgel and the device clinic   Follow up in the AF clinic in 3 months.   Jorja Loa PA-C Afib Clinic [facility] [address] [place], [place] [address] [phone] 10/14/2022 2:58 PM

## 2022-10-15 NOTE — Progress Notes (Signed)
Remote pacemaker transmission.   

## 2022-10-16 ENCOUNTER — Other Ambulatory Visit (HOSPITAL_COMMUNITY): Payer: Self-pay | Admitting: Physician Assistant

## 2022-10-22 ENCOUNTER — Other Ambulatory Visit: Payer: Self-pay | Admitting: Cardiology

## 2022-11-11 ENCOUNTER — Telehealth: Payer: Self-pay | Admitting: *Deleted

## 2022-11-11 NOTE — Telephone Encounter (Signed)
Received call from [facility] - nurse with [facility].  Patient being seen there now with c/o sob x 1 month & felt like he needed to be evaluated.  States EKG was normal today.  Patient last seen by Dr. Diona Browner in May & most recently by afib clinic July.  Notes from urgent care & EKG has been requested.

## 2022-11-12 ENCOUNTER — Ambulatory Visit: Payer: Medicare HMO | Attending: Student | Admitting: Student

## 2022-11-12 ENCOUNTER — Encounter: Payer: Self-pay | Admitting: *Deleted

## 2022-11-12 ENCOUNTER — Encounter: Payer: Self-pay | Admitting: Student

## 2022-11-12 VITALS — BP 130/68 | HR 60 | Ht 69.0 in | Wt 201.8 lb

## 2022-11-12 DIAGNOSIS — E785 Hyperlipidemia, unspecified: Secondary | ICD-10-CM

## 2022-11-12 DIAGNOSIS — I25118 Atherosclerotic heart disease of native coronary artery with other forms of angina pectoris: Secondary | ICD-10-CM | POA: Diagnosis not present

## 2022-11-12 DIAGNOSIS — I480 Paroxysmal atrial fibrillation: Secondary | ICD-10-CM

## 2022-11-12 DIAGNOSIS — I10 Essential (primary) hypertension: Secondary | ICD-10-CM

## 2022-11-12 DIAGNOSIS — R5383 Other fatigue: Secondary | ICD-10-CM

## 2022-11-12 DIAGNOSIS — R0609 Other forms of dyspnea: Secondary | ICD-10-CM | POA: Diagnosis not present

## 2022-11-12 NOTE — Patient Instructions (Signed)
Medication Instructions:  Your physician recommends that you continue on your current medications as directed. Please refer to the Current Medication list given to you today.  *If you need a refill on your cardiac medications before your next appointment, please call your pharmacy*   Lab Work: NONE   If you have labs (blood work) drawn today and your tests are completely normal, you will receive your results only by: MyChart Message (if you have MyChart) OR A paper copy in the mail If you have any lab test that is abnormal or we need to change your treatment, we will call you to review the results.   Testing/Procedures: Your physician has requested that you have a lexiscan myoview. For further information please visit [email or web address] Please follow instruction sheet, as given.    Follow-Up: At [facility], you and your health needs are our priority.  As part of our continuing mission to provide you with exceptional heart care, we have created designated Provider Care Teams.  These Care Teams include your primary Cardiologist (physician) and Advanced Practice Providers (APPs -  Physician Assistants and Nurse Practitioners) who all work together to provide you with the care you need, when you need it.  We recommend signing up for the patient portal called "MyChart".  Sign up information is provided on this After Visit Summary.  MyChart is used to connect with patients for Virtual Visits (Telemedicine).  Patients are able to view lab/test results, encounter notes, upcoming appointments, etc.  Non-urgent messages can be sent to your provider as well.   To learn more about what you can do with MyChart, go to [email or web address].    Your next appointment:    November   Provider:   You may see Nona Dell, MD or one of the following Advanced Practice Providers on your designated Care Team:   Randall An, PA-C  Jacolyn Reedy, PA-C     Other Instructions Thank you  for choosing [facility]!

## 2022-11-12 NOTE — Progress Notes (Signed)
Cardiology Office Note    Date:  11/12/2022  ID:  Witold Henriques, DOB [birth year], MRN [id number] Cardiologist: Nona Dell, MD   EP: Dr. Ladona Ridgel  History of Present Illness:    Calvin Cochran is a 82 y.o. male with past medical history of CAD (s/p DES to mid-LAD in 01/2017), paroxysmal atrial fibrillation complicated by tachy-brady syndrome (s/p St. Jude PPM placement in 09/2020, on Tikosyn), HTN and HLD who presents to the office today for evaluation of worsening shortness of breath.  He was examined by Dr. Diona Browner in 08/2022 and reported NYHA class II dyspnea but no associated chest pain or palpitations. No changes were made to his cardiac medications and he was continued on his current medical therapy. He did follow-up with Atrial Fibrillation Clinic in 10/2022 as well and denied any recent palpitations. Recent device interrogation did show that his atrial fibrillation burden had decreased to 12%. He was continued on Tikosyn 125 mcg twice daily, Eliquis 5 mg twice daily and Toprol-XL 25 mg daily.  In talking with the patient today, he reports having progressive dyspnea on exertion.  Says he has experienced dyspnea on exertion since stent placement in 2018 but feels like symptoms have progressed over the past few months. Notices this with walking up an incline or climbing steps. Denies any associated chest pain or palpitations. No specific orthopnea, PND or pitting edema. He does continue to take Lasix 20 mg daily.  Studies Reviewed:   EKG: EKG is ordered today and demonstrates:   EKG Interpretation Date/Time:  Thursday November 12 2022 12:48:40 EDT Ventricular Rate:  60 PR Interval:  230 QRS Duration:  78 QT Interval:  428 QTC Calculation: 428 R Axis:   6  Text Interpretation: Atrial-paced rhythm with prolonged AV conduction When compared with ECG of 14-Oct-2022 13:47, No significant change was found Confirmed by Randall An ([id number]) on 11/12/2022 2:55:34 PM        Echocardiogram: 08/2021 IMPRESSIONS     1. Left ventricular ejection fraction, by estimation, is 65 to 70%. The  left ventricle has normal function. The left ventricle has no regional  wall motion abnormalities. Left ventricular diastolic parameters are  indeterminate. The average left  ventricular global longitudinal strain is -20.0 %. The global longitudinal  strain is normal.   2. Right ventricular systolic function is normal. The right ventricular  size is normal. There is normal pulmonary artery systolic pressure.   3. The mitral valve was not well visualized. No evidence of mitral valve  regurgitation. No evidence of mitral stenosis.   4. The aortic valve is tricuspid. Aortic valve regurgitation is mild. No  aortic stenosis is present.   5. The inferior vena cava is normal in size with greater than 50%  respiratory variability, suggesting right atrial pressure of 3 mmHg.   Comparison(s): LVEF 60-65%.   Risk Assessment/Calculations:    CHA2DS2-VASc Score = 4   This indicates a 4.8% annual risk of stroke. [name] score is based upon: CHF History: 0 HTN History: 1 Diabetes History: 0 Stroke History: 0 Vascular Disease History: 1 Age Score: 2 Gender Score: 0       STOP-Bang Score:  7        Physical Exam:   VS:  BP 130/68   Pulse 60   Ht 5\' 9"  (1.753 m)   Wt 201 lb 12.8 oz (91.5 kg)   SpO2 98%   BMI 29.80 kg/m    Wt Readings from Last 3 Encounters:  11/12/22 201  lb 12.8 oz (91.5 kg)  10/14/22 202 lb 12.8 oz (92 kg)  08/19/22 196 lb 12.8 oz (89.3 kg)     GEN: Well nourished, well developed male appearing in no acute distress NECK: No JVD; No carotid bruits CARDIAC: RRR, no murmurs, rubs, gallops RESPIRATORY:  Clear to auscultation without rales, wheezing or rhonchi  ABDOMEN: Appears non-distended. No obvious abdominal masses. EXTREMITIES: No clubbing or cyanosis. No pitting edema.  Distal pedal pulses are 2+ bilaterally.   Assessment and Plan:    1. CAD/Dyspnea on Exertion - He did have a DES to the mid-LAD in 01/2017. Was evaluated at Urgent Care earlier this week and reports labs were obtained then and we will request a copy. Would recommend checking a CBC, CMET, TSH and BNP if not. Reviewed options with the patient and will plan to obtain a Lexiscan Myoview for ischemic evaluation. If this and lab work are reassuring, would need to explore a pulmonary etiology.   2. Paroxysmal Atrial Fibrillation/Tachy-brady Syndrome - He does have a St. Jude PPM in place and most recent device interrogation showed a 12% AF burden and his device was functioning normally.  Continue Tikosyn 125 mcg BID and Toprol-XL 25mg  daily.  - No reports of active bleeding. Continue Eliquis 5mg  BID for anticoagulation.   3. HTN - BP is well-controlled at 130/68 during today's visit. Continue Amlodipine 10mg  daily, Losartan 50mg  daily and Toprol-XL 25mg  daily. He is on Lasix 20mg  daily and we will request most recent labs as his creatinine was elevated to 2.04 in 10/2022. If creatinine remains above his baseline, would reduce to Lasix 20mg  every other day.   4. HLD - He did have recent labs but is unsure what was obtained. We will request a copy today. If an updated FLP was not obtained, would draw this on the day of his stress test since he will be NPO. Continue Atorvastatin 40mg  daily.    Signed, Ellsworth Lennox, PA-C

## 2022-11-16 ENCOUNTER — Telehealth: Payer: Self-pay

## 2022-11-16 MED ORDER — APIXABAN 2.5 MG PO TABS: 2.5000 mg | ORAL_TABLET | Freq: Two times a day (BID) | ORAL | 6 refills | Status: DC

## 2022-11-16 NOTE — Telephone Encounter (Signed)
-----   Message from Nona Dell sent at 11/14/2022 12:09 PM EDT ----- Results reviewed.  Recent creatinine 1.8, previously 1.5 up to 2.0 in the last month.  He turned 82 in June.  Would cut Eliquis back to 2.5 mg twice daily.

## 2022-11-16 NOTE — Telephone Encounter (Signed)
Results discussed with patient. He will decrease Eliquis to 2.5 mg bid  E-scribed to [facility]

## 2022-11-18 ENCOUNTER — Telehealth (HOSPITAL_COMMUNITY): Payer: Self-pay | Admitting: Emergency Medicine

## 2022-11-18 NOTE — Telephone Encounter (Signed)
Spoke with patient regarding lexiscan and scheduled time with Nuclear Med. Pt understands instructions.

## 2022-11-20 ENCOUNTER — Encounter (HOSPITAL_BASED_OUTPATIENT_CLINIC_OR_DEPARTMENT_OTHER)
Admission: RE | Admit: 2022-11-20 | Discharge: 2022-11-20 | Disposition: A | Discharge status: Home / Self Care | Payer: Medicare HMO | Source: Ambulatory Visit | Attending: Student | Admitting: Student

## 2022-11-20 ENCOUNTER — Telehealth: Payer: Self-pay | Admitting: *Deleted

## 2022-11-20 ENCOUNTER — Ambulatory Visit (HOSPITAL_COMMUNITY)
Admission: RE | Admit: 2022-11-20 | Discharge: 2022-11-20 | Disposition: A | Discharge status: Home / Self Care | Payer: Medicare HMO | Source: Ambulatory Visit | Attending: Diagnostic Radiology | Admitting: Diagnostic Radiology

## 2022-11-20 DIAGNOSIS — I493 Ventricular premature depolarization: Secondary | ICD-10-CM | POA: Insufficient documentation

## 2022-11-20 DIAGNOSIS — R5383 Other fatigue: Secondary | ICD-10-CM

## 2022-11-20 DIAGNOSIS — R0609 Other forms of dyspnea: Secondary | ICD-10-CM | POA: Insufficient documentation

## 2022-11-20 DIAGNOSIS — Z79899 Other long term (current) drug therapy: Secondary | ICD-10-CM

## 2022-11-20 LAB — NM MYOCAR MULTI W/SPECT W/WALL MOTION / EF

## 2022-11-20 MED ORDER — REGADENOSON 0.4 MG/5ML IV SOLN
INTRAVENOUS | Status: AC
  Administered 2022-11-20: 0.4 mg via INTRAVENOUS
  Filled 2022-11-20: qty 5

## 2022-11-20 MED ORDER — TECHNETIUM TC 99M TETROFOSMIN IV KIT
10.7000 | PACK | Freq: Once | INTRAVENOUS | Status: AC | PRN
  Administered 2022-11-20: 10.7 via INTRAVENOUS

## 2022-11-20 MED ORDER — SODIUM CHLORIDE FLUSH 0.9 % IV SOLN
INTRAVENOUS | Status: AC
  Administered 2022-11-20: 10 mL via INTRAVENOUS
  Filled 2022-11-20: qty 10

## 2022-11-20 MED ORDER — POTASSIUM CHLORIDE CRYS ER 20 MEQ PO TBCR: 20.0000 meq | EXTENDED_RELEASE_TABLET | Freq: Every day | ORAL | 3 refills | Status: DC

## 2022-11-20 MED ORDER — FUROSEMIDE 20 MG PO TABS: 20.0000 mg | ORAL_TABLET | ORAL | 3 refills | Status: DC

## 2022-11-20 MED ORDER — TECHNETIUM TC 99M TETROFOSMIN IV KIT
30.3000 | PACK | Freq: Once | INTRAVENOUS | Status: AC | PRN
  Administered 2022-11-20: 30.3 via INTRAVENOUS

## 2022-11-20 NOTE — Telephone Encounter (Signed)
Pt notified of test results

## 2022-11-20 NOTE — Telephone Encounter (Signed)
-----   Message from Ellsworth Lennox sent at 11/20/2022  3:50 PM EDT ----- Please let the patient know that his stress test was low-risk and showed no evidence of significant blockages.  Overall, a reassuring study.  At the time of the stress test, we did review that recent labs showed his kidney function was abnormal and I did recommend reducing Lasix to 20 mg every other day and reducing K-dur to 20 mEq daily. Please make sure he has a follow-up BMET within the next two weeks for reassessment of his kidney function and electrolytes.

## 2022-12-08 ENCOUNTER — Other Ambulatory Visit: Payer: Self-pay | Admitting: Internal Medicine

## 2022-12-08 ENCOUNTER — Other Ambulatory Visit (HOSPITAL_COMMUNITY): Payer: Self-pay | Admitting: Physician Assistant

## 2023-01-01 ENCOUNTER — Ambulatory Visit: Payer: Medicare HMO

## 2023-01-01 DIAGNOSIS — I495 Sick sinus syndrome: Secondary | ICD-10-CM | POA: Diagnosis not present

## 2023-01-02 LAB — CUP PACEART REMOTE DEVICE CHECK
Battery Remaining Longevity: 86 mo
Battery Remaining Percentage: 80 %
Battery Voltage: 3.01 V
Brady Statistic AP VP Percent: 1 %
Brady Statistic AP VS Percent: 64 %
Brady Statistic AS VP Percent: 1 %
Brady Statistic AS VS Percent: 35 %
Brady Statistic RA Percent Paced: 57 %
Brady Statistic RV Percent Paced: 4.6 %
Date Time Interrogation Session: 20240927020014
Implantable Lead Connection Status: 753985
Implantable Lead Connection Status: 753985
Implantable Lead Implant Date: 20220606
Implantable Lead Implant Date: 20220606
Implantable Lead Location: 753859
Implantable Lead Location: 753860
Implantable Pulse Generator Implant Date: 20220606
Lead Channel Impedance Value: 400 Ohm
Lead Channel Impedance Value: 400 Ohm
Lead Channel Pacing Threshold Amplitude: 0.5 V
Lead Channel Pacing Threshold Amplitude: 1.25 V
Lead Channel Pacing Threshold Pulse Width: 0.5 ms
Lead Channel Pacing Threshold Pulse Width: 0.5 ms
Lead Channel Sensing Intrinsic Amplitude: 12 mV
Lead Channel Sensing Intrinsic Amplitude: 3.1 mV
Lead Channel Setting Pacing Amplitude: 2 V
Lead Channel Setting Pacing Amplitude: 2.5 V
Lead Channel Setting Pacing Pulse Width: 0.5 ms
Lead Channel Setting Sensing Sensitivity: 2 mV
Pulse Gen Model: 2272
Pulse Gen Serial Number: 6460902

## 2023-01-07 NOTE — Progress Notes (Signed)
Remote pacemaker transmission.   

## 2023-02-22 ENCOUNTER — Other Ambulatory Visit (HOSPITAL_COMMUNITY): Payer: Self-pay | Admitting: Physician Assistant

## 2023-02-27 ENCOUNTER — Other Ambulatory Visit: Payer: Self-pay | Admitting: Cardiology

## 2023-03-01 ENCOUNTER — Encounter: Payer: Self-pay | Admitting: Cardiology

## 2023-03-01 ENCOUNTER — Other Ambulatory Visit (HOSPITAL_COMMUNITY): Payer: Self-pay | Admitting: Physician Assistant

## 2023-03-01 ENCOUNTER — Ambulatory Visit: Payer: Medicare HMO | Attending: Cardiology | Admitting: Cardiology

## 2023-03-01 VITALS — BP 112/68 | HR 61 | Ht 69.0 in | Wt 200.6 lb

## 2023-03-01 DIAGNOSIS — I25119 Atherosclerotic heart disease of native coronary artery with unspecified angina pectoris: Secondary | ICD-10-CM

## 2023-03-01 DIAGNOSIS — I480 Paroxysmal atrial fibrillation: Secondary | ICD-10-CM

## 2023-03-01 DIAGNOSIS — I495 Sick sinus syndrome: Secondary | ICD-10-CM

## 2023-03-01 DIAGNOSIS — E782 Mixed hyperlipidemia: Secondary | ICD-10-CM

## 2023-03-01 MED ORDER — METOPROLOL SUCCINATE ER 25 MG PO TB24: 12.5000 mg | ORAL_TABLET | Freq: Every day | ORAL | 2 refills | Status: DC

## 2023-03-01 NOTE — Progress Notes (Signed)
    Cardiology Office Note  Date: 03/01/2023   ID: Calvin Cochran, DOB [birth year], MRN [id number]  History of Present Illness: Calvin Cochran is an 82 y.o. male last seen in August by Ms. Strader PA-C, I reviewed the note.  He is here for a follow-up visit.  Reports no angina or nitroglycerin use, no sense of palpitations, no dizziness or syncope.  Dyspnea on exertion is stable, mostly notable with carrying things or walking up steps.  Generally NYHA class II.  St. Jude pacemaker in place with followed by Dr. Ladona Ridgel.  Device interrogation in September revealed 9.5% AF burden.  I reviewed his medications.  Current cardiovascular regimen includes Eliquis, Norvasc, Lipitor, Tikosyn, Cozaar, Toprol-XL, Lasix, and potassium supplement.  He also has as needed nitroglycerin available.  Physical Exam: VS:  BP 112/68   Pulse 61   Ht 5\' 9"  (1.753 m)   Wt 200 lb 9.6 oz (91 kg)   SpO2 98%   BMI 29.62 kg/m , BMI Body mass index is 29.62 kg/m.  Wt Readings from Last 3 Encounters:  03/01/23 200 lb 9.6 oz (91 kg)  11/12/22 201 lb 12.8 oz (91.5 kg)  10/14/22 202 lb 12.8 oz (92 kg)    General: Patient appears comfortable at rest. HEENT: Conjunctiva and lids normal. Neck: Supple, no elevated JVP or carotid bruits. Lungs: Clear to auscultation, nonlabored breathing at rest. Cardiac: Regular rate and rhythm, no S3, 2/6 systolic murmur.  ECG:  An ECG dated 11/12/2022 was personally reviewed today and demonstrated:  Atrial paced rhythm.  Labwork: 10/14/2022: BUN 29; Creatinine, Ser 2.04; Hemoglobin 14.9; Magnesium 2.2; Platelets 184; Potassium 4.4; Sodium 140   Other Studies Reviewed Today:  Lexiscan Myoview 11/20/2022:   The study is normal. The study is low risk.   No ST deviation was noted.   LV perfusion is normal.   Left ventricular function is normal. Nuclear stress EF: 65%. The left ventricular ejection fraction is normal (55-65%). End diastolic cavity size is normal.   Elevated TID  nonspecific findings in absence of perfusion defect.  Assessment and Plan:  1.  CAD status post DES to the mid LAD in October 2018.  Recent follow-up Lexiscan Myoview in August of this year was low risk, no focal perfusion defects to indicate ischemia and LVEF 65%.  Continue observation on medical therapy which now includes Lipitor, Norvasc, and as needed nitroglycerin.   2.  Paroxysmal atrial fibrillation with CHA2DS2-VASc score of 4.  Improving AF burden on Tikosyn.  He does not report any bleeding problems on Eliquis currently dosed at 2.5 mg twice daily.  Decrease Toprol-XL to 12.5 mg daily.   3.  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 Ladona Ridgel.   4.  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  States that he is establishing with a new PCP in [place].  5.  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  Disposition:  Follow up  6 months.  Signed, Jonelle Sidle, M.D., F.A.C.C. [facility]

## 2023-03-01 NOTE — Patient Instructions (Addendum)
Medication Instructions:  Your physician has recommended you make the following change in your medication:  Decrease metoprolol succinate to 12.5 mg daily Continue all other medications as prescribed  Labwork: none  Testing/Procedures: none  Follow-Up: Your physician recommends that you schedule a follow-up appointment in: 6 months  Any Other Special Instructions Will Be Listed Below (If Applicable).  If you need a refill on your cardiac medications before your next appointment, please call your pharmacy.

## 2023-03-12 ENCOUNTER — Other Ambulatory Visit: Payer: Self-pay | Admitting: Cardiology

## 2023-03-29 ENCOUNTER — Other Ambulatory Visit: Payer: Self-pay | Admitting: Cardiology

## 2023-03-29 DIAGNOSIS — I480 Paroxysmal atrial fibrillation: Secondary | ICD-10-CM

## 2023-03-29 MED ORDER — APIXABAN 2.5 MG PO TABS
2.5000 mg | ORAL_TABLET | Freq: Two times a day (BID) | ORAL | 5 refills | Status: DC
Start: 2023-03-29 — End: 2023-07-29

## 2023-03-29 NOTE — Telephone Encounter (Signed)
Eliquis 5mg  refill request received. Dose is appropriate based on dosing criteria. Patient is 82 years old, weight-91kg, Crea- 2.04 on 10/14/22, Diagnosis-Afib, and last seen by Dr. Diona Browner on 03/01/23.  Will send in refill to requested pharmacy.   Last refill eliquis 2.5mg  sent 11/16/22 will send in correct dose and per last OV note it states: Paroxysmal atrial fibrillation with CHA2DS2-VASc score of 4.  Improving AF burden on Tikosyn.  He does not report any bleeding problems on Eliquis currently dosed at 2.5 mg twice daily.  Decrease Toprol-XL to 12.5 mg daily.

## 2023-04-02 ENCOUNTER — Ambulatory Visit (INDEPENDENT_AMBULATORY_CARE_PROVIDER_SITE_OTHER): Payer: Medicare HMO

## 2023-04-02 DIAGNOSIS — I495 Sick sinus syndrome: Secondary | ICD-10-CM

## 2023-04-03 LAB — CUP PACEART REMOTE DEVICE CHECK
Battery Remaining Longevity: 85 mo
Battery Remaining Percentage: 78 %
Battery Voltage: 3.01 V
Brady Statistic AP VP Percent: 1 %
Brady Statistic AP VS Percent: 63 %
Brady Statistic AS VP Percent: 1 %
Brady Statistic AS VS Percent: 36 %
Brady Statistic RA Percent Paced: 57 %
Brady Statistic RV Percent Paced: 4.7 %
Date Time Interrogation Session: 20241227023024
Implantable Lead Connection Status: 753985
Implantable Lead Connection Status: 753985
Implantable Lead Implant Date: 20220606
Implantable Lead Implant Date: 20220606
Implantable Lead Location: 753859
Implantable Lead Location: 753860
Implantable Pulse Generator Implant Date: 20220606
Lead Channel Impedance Value: 400 Ohm
Lead Channel Impedance Value: 400 Ohm
Lead Channel Pacing Threshold Amplitude: 0.5 V
Lead Channel Pacing Threshold Amplitude: 1.25 V
Lead Channel Pacing Threshold Pulse Width: 0.5 ms
Lead Channel Pacing Threshold Pulse Width: 0.5 ms
Lead Channel Sensing Intrinsic Amplitude: 12 mV
Lead Channel Sensing Intrinsic Amplitude: 3.2 mV
Lead Channel Setting Pacing Amplitude: 2 V
Lead Channel Setting Pacing Amplitude: 2.5 V
Lead Channel Setting Pacing Pulse Width: 0.5 ms
Lead Channel Setting Sensing Sensitivity: 2 mV
Pulse Gen Model: 2272
Pulse Gen Serial Number: 6460902

## 2023-05-07 NOTE — Addendum Note (Signed)
Addended by: Elease Etienne A on: 05/07/2023 08:45 AM   Modules accepted: Orders

## 2023-05-07 NOTE — Progress Notes (Signed)
 Remote pacemaker transmission.

## 2023-05-17 ENCOUNTER — Other Ambulatory Visit (HOSPITAL_COMMUNITY): Payer: Self-pay | Admitting: Physician Assistant

## 2023-05-18 ENCOUNTER — Telehealth: Payer: Self-pay

## 2023-05-18 NOTE — Telephone Encounter (Signed)
Patient has AF clinic appt on 06/01/23. AF burden elevated, trending upward, recent events. Known PAF, on OAC. Presenting is NSR.  FYI to R. Fenton PA.

## 2023-06-01 ENCOUNTER — Encounter (HOSPITAL_COMMUNITY): Payer: Self-pay | Admitting: Physician Assistant

## 2023-06-01 ENCOUNTER — Ambulatory Visit (HOSPITAL_COMMUNITY)
Admission: RE | Admit: 2023-06-01 | Discharge: 2023-06-01 | Disposition: A | Discharge status: Home / Self Care | Payer: Medicare HMO | Source: Ambulatory Visit | Attending: Physician Assistant | Admitting: Physician Assistant

## 2023-06-01 VITALS — BP 110/82 | HR 83 | Ht 69.0 in | Wt 201.4 lb

## 2023-06-01 DIAGNOSIS — I495 Sick sinus syndrome: Secondary | ICD-10-CM | POA: Insufficient documentation

## 2023-06-01 DIAGNOSIS — Z5181 Encounter for therapeutic drug level monitoring: Secondary | ICD-10-CM | POA: Diagnosis not present

## 2023-06-01 DIAGNOSIS — D6869 Other thrombophilia: Secondary | ICD-10-CM | POA: Insufficient documentation

## 2023-06-01 DIAGNOSIS — I110 Hypertensive heart disease with heart failure: Secondary | ICD-10-CM | POA: Diagnosis not present

## 2023-06-01 DIAGNOSIS — Z79899 Other long term (current) drug therapy: Secondary | ICD-10-CM | POA: Diagnosis not present

## 2023-06-01 DIAGNOSIS — Z950 Presence of cardiac pacemaker: Secondary | ICD-10-CM | POA: Diagnosis not present

## 2023-06-01 DIAGNOSIS — Z955 Presence of coronary angioplasty implant and graft: Secondary | ICD-10-CM | POA: Insufficient documentation

## 2023-06-01 DIAGNOSIS — G4733 Obstructive sleep apnea (adult) (pediatric): Secondary | ICD-10-CM | POA: Diagnosis not present

## 2023-06-01 DIAGNOSIS — I480 Paroxysmal atrial fibrillation: Secondary | ICD-10-CM | POA: Insufficient documentation

## 2023-06-01 DIAGNOSIS — Z7901 Long term (current) use of anticoagulants: Secondary | ICD-10-CM | POA: Diagnosis not present

## 2023-06-01 DIAGNOSIS — I2510 Atherosclerotic heart disease of native coronary artery without angina pectoris: Secondary | ICD-10-CM | POA: Diagnosis not present

## 2023-06-01 DIAGNOSIS — I5032 Chronic diastolic (congestive) heart failure: Secondary | ICD-10-CM | POA: Diagnosis not present

## 2023-06-01 LAB — BASIC METABOLIC PANEL
Anion gap: 7 (ref 5–15)
BUN: 15 mg/dL (ref 8–23)
CO2: 23 mmol/L (ref 22–32)
Calcium: 8.3 mg/dL — ABNORMAL LOW (ref 8.9–10.3)
Chloride: 109 mmol/L (ref 98–111)
Creatinine, Ser: 1.3 mg/dL — ABNORMAL HIGH (ref 0.61–1.24)
GFR, Estimated: 56 mL/min — ABNORMAL LOW (ref >60)
Glucose, Bld: 123 mg/dL — ABNORMAL HIGH (ref 70–99)
Potassium: 3 mmol/L — ABNORMAL LOW (ref 3.5–5.1)
Sodium: 139 mmol/L (ref 135–145)

## 2023-06-01 LAB — MAGNESIUM: Magnesium: 1.8 mg/dL (ref 1.7–2.4)

## 2023-06-01 MED ORDER — FUROSEMIDE 20 MG PO TABS: 20.0000 mg | ORAL_TABLET | Freq: Every day | ORAL | Status: DC

## 2023-06-01 NOTE — Patient Instructions (Addendum)
 Lasix 20 mg daily

## 2023-06-01 NOTE — Progress Notes (Signed)
 Primary Care Physician: System, Provider Not In Primary Cardiologist: Dr Diona Browner Primary Electrophysiologist: Dr Ladona Ridgel Referring Physician: Dr [name] is a 83 y.o. male with a history of CAD, HTN, tachybradycardia syndrome s/p PPM, atrial fibrillation who presents for follow up in the [facility] Fibr[facility]. Patient noted to have a higher burden on afib on his PPM, >20% and Dr Ladona Ridgel recommended dofetilide. Patient is on Eliquis for a CHADS2VASC score of 4.  Patient is s/p dofetilide loading 2/26-2/29/24. His dose was decreased for QT prolongation at his visit on 06/11/22.  Patient returns for follow up for atrial fibrillation and dofetilide monitoring. Patient is in coarse afib vs atrial flutter today, asymptomatic. Review of his device shows an increase in afib burden since beginning of January. Patient reports that he was stressed during that time moving into a new retirement community. His latest transmission today shows a 17% burden, trending downward. He has also noticed more lower extremity edema since taking lasix every other day.   Today, he denies symptoms of palpitations, chest pain, shortness of breath, orthopnea, PND, dizziness, presyncope, syncope, snoring, daytime somnolence, bleeding, or neurologic sequela. The patient is tolerating medications without difficulties and is otherwise without complaint today.    Atrial Fibrillation Risk Factors:  he does not have symptoms or diagnosis of sleep apnea. he does not have a history of rheumatic fever.   Atrial Fibrillation Management history:  Previous antiarrhythmic drugs: dofetilide  Previous cardioversions: none Previous ablations: none Anticoagulation history: Eliquis   Past Medical History:  Diagnosis Date   CAD (coronary artery disease)    DES to mid LAD October 2018   Essential hypertension    NSTEMI (non-ST elevated myocardial infarction) (HCC) 01/2017   Paroxysmal atrial  fibrillation (HCC)    Tachycardia-bradycardia syndrome [facility])    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 Ladona Ridgel    ROS- All systems are reviewed and negative except as per the HPI above.  Physical Exam: Vitals:   06/01/23 1013  BP: 110/82  Pulse: 83  Weight: 91.4 kg  Height: 5\' 9"  (1.753 m)     GEN: Well nourished, well developed in no acute distress NECK: No JVD; No carotid bruits CARDIAC: Irregularly irregular rate and rhythm, no murmurs, rubs, gallops RESPIRATORY:  Clear to auscultation without rales, wheezing or rhonchi  ABDOMEN: Soft, non-tender, non-distended EXTREMITIES:  1-2+ lower extremity edema, No deformity    Wt Readings from Last 3 Encounters:  06/01/23 91.4 kg  03/01/23 91 kg  11/12/22 91.5 kg    EKG today demonstrates  Atypical atrial flutter with variable block vs coarse afib, intermittent V pacing Vent. rate 83 BPM PR interval * ms QRS duration 76 ms QT/QTcB 378/444 ms   Echo 08/12/21 demonstrated   1. Left ventricular ejection fraction, by estimation, is 65 to 70%. The  left ventricle has normal function. The left ventricle has no regional  wall motion abnormalities. Left ventricular diastolic parameters are  indeterminate. The average left ventricular global longitudinal strain is -20.0 %. The global longitudinal strain is normal.   2. Right ventricular systolic function is normal. The right ventricular  size is normal. There is normal pulmonary artery systolic pressure.   3. The mitral valve was not well visualized. No evidence of mitral valve  regurgitation. No evidence of mitral stenosis.   4. The aortic valve is tricuspid. Aortic valve regurgitation is mild. No  aortic stenosis is present.   5. The inferior vena cava is  normal in size with greater than 50%  respiratory variability, suggesting right atrial pressure of 3 mmHg.   Comparison(s): LVEF 60-65%.   Epic records are reviewed at length today  CHA2DS2-VASc Score = 4  [name] score  is based upon: CHF History: 0 HTN History: 1 Diabetes History: 0 Stroke History: 0 Vascular Disease History: 1 Age Score: 2 Gender Score: 0       ASSESSMENT AND PLAN: Paroxysmal Atrial Fibrillation (ICD10:  I48.0) [name] CHA2DS2-VASc score is 4, indicating a 4.8% annual risk of stroke.   S/p dofetilide admission 05/2022 PPM showed elevated burden in January and early February, now trending downward, last check 17%. We discussed rhythm control options today. Will continue present medication for now. Can consider amiodarone or ablation if he fails dofetilide.  Continue dofetilide 125 mcg BID Continue Eliquis 5 mg BID Continue Toprol 12.5 mg daily  Secondary Hypercoagulable State (ICD10:  D68.69) The patient is at significant risk for stroke/thromboembolism based upon his CHA2DS2-VASc Score of 4.  Continue Apixaban (Eliquis). No bleeding issues.  High Risk Medication Monitoring (ICD 10: Z79.899) QT interval difficult to assess in atrial flutter but measurement still acceptable for dofetilide monitoring. 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 If renal function stable, would recommend increasing lasix to daily. Checking bmet as above.    Follow up in the AF clinic in 6 months.    Jorja Loa PA-C Afib Clinic [facility] [address] [place], [place] [address] [phone] 06/01/2023 10:46 AM

## 2023-06-04 ENCOUNTER — Other Ambulatory Visit (HOSPITAL_COMMUNITY): Payer: Self-pay | Admitting: *Deleted

## 2023-06-04 DIAGNOSIS — I480 Paroxysmal atrial fibrillation: Secondary | ICD-10-CM

## 2023-06-04 MED ORDER — POTASSIUM CHLORIDE CRYS ER 20 MEQ PO TBCR: 40.0000 meq | EXTENDED_RELEASE_TABLET | Freq: Every day | ORAL | 2 refills | Status: AC

## 2023-06-07 ENCOUNTER — Other Ambulatory Visit (HOSPITAL_COMMUNITY): Payer: Self-pay | Admitting: Neurological Surgery

## 2023-06-07 DIAGNOSIS — M48062 Spinal stenosis, lumbar region with neurogenic claudication: Secondary | ICD-10-CM

## 2023-06-15 ENCOUNTER — Encounter (HOSPITAL_COMMUNITY): Payer: Self-pay

## 2023-06-16 ENCOUNTER — Other Ambulatory Visit (HOSPITAL_COMMUNITY): Payer: Self-pay | Admitting: Physician Assistant

## 2023-06-17 LAB — BASIC METABOLIC PANEL
BUN/Creatinine Ratio: 15 (calc) (ref 6–22)
BUN: 20 mg/dL (ref 7–25)
CO2: 29 mmol/L (ref 20–32)
Calcium: 9.5 mg/dL (ref 8.6–10.3)
Chloride: 102 mmol/L (ref 98–110)
Creat: 1.34 mg/dL — ABNORMAL HIGH (ref 0.70–1.22)
Glucose, Bld: 96 mg/dL (ref 65–99)
Potassium: 4.8 mmol/L (ref 3.5–5.3)
Sodium: 140 mmol/L (ref 135–146)

## 2023-06-21 ENCOUNTER — Ambulatory Visit: Admitting: Orthopedic Surgery

## 2023-06-21 DIAGNOSIS — M160 Bilateral primary osteoarthritis of hip: Secondary | ICD-10-CM | POA: Diagnosis not present

## 2023-06-21 DIAGNOSIS — M48062 Spinal stenosis, lumbar region with neurogenic claudication: Secondary | ICD-10-CM | POA: Diagnosis not present

## 2023-06-21 NOTE — Progress Notes (Unsigned)
 Office Visit Note   Patient: Calvin Cochran           Date of Birth: July 09, 1940           MRN: [id number] Visit Date: 06/21/2023 Requested by: Barnett Abu, MD [address] [place],  [place] [address] PCP: System, Provider Not In  Subjective: Chief Complaint  Patient presents with   Other    Bilateral hip pain    HPI: Calvin Cochran is a 84 y.o. male who presents to the office reporting bilateral hip pain left worse than right as well as low back pain.  This has been going on for about 2 years with no known injury.  Difficulty with ambulation.  Patient states "at times I feel like I cannot walk".  Pain does wake him from sleep at night.  Relatively constant.  He has had injections and what sounds like ablation of the facet nerves in his lumbar spine.  He did bring in a disc which shows rather significant stenosis 2 years ago in the lumbar spine.  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  Patient describes pain with ambulation and groin.  Deep pain.  Does report some catching when he walks.  Hard for him to lift his leg at times.  Does describe decreased walking endurance.  Surprisingly his right hip gives him very few symptoms.  He does have another lumbar spine MRI pending.  Does describe pain which radiates down the left calf.  [place] in a ranch style apartment with no stairs.  He does take Eliquis and has a pacemaker.  He does describe low back pain with prolonged ambulation as well..                ROS: All systems reviewed are negative as they relate to the chief complaint within the history of present illness.  Patient denies fevers or chills.  Assessment & Plan: Visit Diagnoses:  1. Spinal stenosis of lumbar region with neurogenic claudication     Plan: Impression is severe end-stage left hip arthritis in a patient with multiple medical comorbidities.  He also has  spinal stenosis in the lumbar spine which is severe.  He is seeing Dr. Danielle Dess for management of the back.  I think he definitely has 2 processes going on which are affecting his ability to walk.  At this time we will tentatively post him for left total hip replacement.  The risk and benefits are discussed with Casimiro Needle including not limited to infection nerve vessel damage leg length inequality as well as potential need for further surgery.  Expected rehab time also discussed.  I do think it would be important for Tou to get a disposition on his back from Dr. Danielle Dess with the new MRI scan.  Previous MRI scan did demonstrate significant stenosis.  I do think when it comes to priority of scheduling with other factors being equal that it could be easier for him to rehabilitate from back surgery with an improved ability to ambulate.  Patient will be better suited for Norco for postop pain relief due to issues with oxycodone in the past.  Follow-Up Instructions: No follow-ups on file.   Orders:  No orders of the defined types were placed in this encounter.  No orders of the defined types were placed in this encounter.     Procedures: No procedures performed   Clinical Data: No additional  findings.  Objective: Vital Signs: There were no vitals taken for this visit.  Physical Exam:  Constitutional: Patient appears well-developed HEENT:  Head: Normocephalic Eyes:EOM are normal Neck: Normal range of motion Cardiovascular: Normal rate Pulmonary/chest: Effort normal Neurologic: Patient is alert Skin: Skin is warm Psychiatric: Patient has normal mood and affect  Ortho Exam: Ortho exam demonstrates Trendelenburg gait to the left.  Does have groin pain with internal/external rotation of the left hip but not the right.  Pedal pulses are palpable.  Ankle dorsiflexion plantarflexion quad hamstring strength intact.  Does have a little bit of weakness with hip flexion on the left at 4-5 compared to  the right.  Does have some mild pain with forward lateral bending.  No atrophy asymmetrically in either leg.  Specialty Comments:  No specialty comments available.  Imaging: No results found.   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 Ladona Ridgel    Family History  Problem Relation Age of Onset   Lung cancer Father     Past Surgical History:  Procedure Laterality Date   CORONARY STENT INTERVENTION N/A 02/01/2017   Procedure: CORONARY STENT INTERVENTION;  Surgeon: Runell Gess, MD;  Location: [facility] INVASIVE CV LAB;  Service: Cardiovascular;  Laterality: N/A;  LAD Prox-Mid lesion stented   LEFT HEART CATH AND CORONARY ANGIOGRAPHY N/A 02/01/2017   Procedure: LEFT HEART CATH AND CORONARY ANGIOGRAPHY;  Surgeon: Runell Gess, MD;  Location: [facility] INVASIVE CV LAB;  Service: Cardiovascular;  Laterality: N/A;   PACEMAKER IMPLANT N/A 09/09/2020   Procedure: PACEMAKER IMPLANT;  Surgeon: Marinus Maw, MD;  Location: [facility] INVASIVE CV LAB;  Service: Cardiovascular;  Laterality: N/A;   Social History   Occupational History   Not on file  Tobacco Use   Smoking status: Never   Smokeless tobacco: Never   Tobacco comments:    Never smoke 06/11/22   Vaping Use   Vaping status: Never Used  Substance and Sexual Activity   Alcohol use: Yes    Alcohol/week: 1.0 standard drink of alcohol    Types: 1 Shots of liquor per week    Comment: 1 shot of bourbon weekly 06/11/22   Drug use: No   Sexual activity: Not on file

## 2023-06-24 ENCOUNTER — Encounter: Payer: Self-pay | Admitting: Orthopedic Surgery

## 2023-07-02 ENCOUNTER — Ambulatory Visit (INDEPENDENT_AMBULATORY_CARE_PROVIDER_SITE_OTHER): Payer: Medicare HMO

## 2023-07-02 DIAGNOSIS — I495 Sick sinus syndrome: Secondary | ICD-10-CM

## 2023-07-02 LAB — CUP PACEART REMOTE DEVICE CHECK
Battery Remaining Longevity: 82 mo
Battery Remaining Percentage: 76 %
Battery Voltage: 3.01 V
Brady Statistic AP VP Percent: 1.1 %
Brady Statistic AP VS Percent: 59 %
Brady Statistic AS VP Percent: 1.1 %
Brady Statistic AS VS Percent: 39 %
Brady Statistic RA Percent Paced: 53 %
Brady Statistic RV Percent Paced: 6.6 %
Date Time Interrogation Session: 20250328020019
Implantable Lead Connection Status: 753985
Implantable Lead Connection Status: 753985
Implantable Lead Implant Date: 20220606
Implantable Lead Implant Date: 20220606
Implantable Lead Location: 753859
Implantable Lead Location: 753860
Implantable Pulse Generator Implant Date: 20220606
Lead Channel Impedance Value: 390 Ohm
Lead Channel Impedance Value: 400 Ohm
Lead Channel Pacing Threshold Amplitude: 0.5 V
Lead Channel Pacing Threshold Amplitude: 1.25 V
Lead Channel Pacing Threshold Pulse Width: 0.5 ms
Lead Channel Pacing Threshold Pulse Width: 0.5 ms
Lead Channel Sensing Intrinsic Amplitude: 11.2 mV
Lead Channel Sensing Intrinsic Amplitude: 3.7 mV
Lead Channel Setting Pacing Amplitude: 2 V
Lead Channel Setting Pacing Amplitude: 2.5 V
Lead Channel Setting Pacing Pulse Width: 0.5 ms
Lead Channel Setting Sensing Sensitivity: 2 mV
Pulse Gen Model: 2272
Pulse Gen Serial Number: 6460902

## 2023-07-06 ENCOUNTER — Telehealth: Payer: Self-pay | Admitting: *Deleted

## 2023-07-06 NOTE — Telephone Encounter (Signed)
 Pt has appt schedule with Dr Diona Browner. Preop clearance added to appt notes.

## 2023-07-06 NOTE — Telephone Encounter (Signed)
   Name: Calvin Cochran  DOB: 05/02/1940  MRN: [id number]  Primary Cardiologist: Nona Dell, MD  Chart reviewed as part of pre-operative protocol coverage. Because of Raymondo Garcialopez past medical history and time since last visit, he will require a follow-up in-office visit in order to better assess preoperative cardiovascular risk.  Pre-op covering staff: - Please schedule appointment and call patient to inform them. If patient already had an upcoming appointment within acceptable timeframe, please add "pre-op clearance" to the appointment notes so provider is aware. - Please contact requesting surgeon's office via preferred method (i.e, phone, fax) to inform them of need for appointment prior to surgery.  Per office protocol, patient can hold Eliquis for 3 days prior to procedure.  Please resume a medically safe to do so.  Patient does not reside in [place] therefore will need a in office visit.  Sharlene Dory, PA-C  07/06/2023, 3:17 PM

## 2023-07-06 NOTE — Telephone Encounter (Signed)
 Patient with diagnosis of afib on Eliquis for anticoagulation.    Procedure:  LEFT TOTAL HIP ARTHROPLASTY  Date of procedure: TBD   CHA2DS2-VASc Score = 4   This indicates a 4.8% annual risk of stroke. [name] score is based upon: CHF History: 0 HTN History: 1 Diabetes History: 0 Stroke History: 0 Vascular Disease History: 1 Age Score: 2 Gender Score: 0      CrCl 49 ml/min Platelet count 184  Per office protocol, patient can hold Eliquis for 3 days prior to procedure.    **This guidance is not considered finalized until pre-operative APP has relayed final recommendations.**

## 2023-07-06 NOTE — Telephone Encounter (Signed)
   Pre-operative Risk Assessment    Patient Name: Calvin Cochran  DOB: January 26, 1941 MRN: [id number]   Date of last office visit: 06/01/23 Date of next office visit: 08/18/23  BUT THEY ARE WANTING TO GET SURGERY DONE ASAP   Request for Surgical Clearance    Procedure:   LEFT TOTAL HIP ARTHROPLASTY  Date of Surgery:  Clearance TBD    BUT ARE WANTING TO GET IT DONE ASAP                              Surgeon:  G. Dorene Grebe, MD Surgeon's Group or Practice Name:  [facility] Phone number:  [phone] Fax number:  [phone]   Type of Clearance Requested:   - Medical  - Pharmacy:  Hold Apixaban (Eliquis) THEY ARE ASKING FOR PERIOPERATIVE INSTRUCTIONS FROM [place] PER FORM   Type of Anesthesia:   CHOICE   Additional requests/questions:    Wilhemina Cash   07/06/2023, 6:50 AM

## 2023-07-11 ENCOUNTER — Encounter: Payer: Self-pay | Admitting: Internal Medicine

## 2023-07-12 ENCOUNTER — Ambulatory Visit (HOSPITAL_COMMUNITY)
Admission: RE | Admit: 2023-07-12 | Discharge: 2023-07-12 | Disposition: A | Discharge status: Home / Self Care | Source: Ambulatory Visit | Attending: Neurological Surgery | Admitting: Neurological Surgery

## 2023-07-12 ENCOUNTER — Encounter (HOSPITAL_COMMUNITY): Payer: Self-pay

## 2023-07-12 ENCOUNTER — Ambulatory Visit (HOSPITAL_COMMUNITY)

## 2023-07-12 DIAGNOSIS — M48062 Spinal stenosis, lumbar region with neurogenic claudication: Secondary | ICD-10-CM | POA: Diagnosis present

## 2023-07-13 ENCOUNTER — Ambulatory Visit: Attending: Nurse Practitioner | Admitting: Nurse Practitioner

## 2023-07-13 ENCOUNTER — Encounter: Payer: Self-pay | Admitting: Family Medicine

## 2023-07-13 ENCOUNTER — Encounter: Payer: Self-pay | Admitting: Nurse Practitioner

## 2023-07-13 VITALS — BP 130/82 | HR 72 | Ht 69.0 in | Wt 194.2 lb

## 2023-07-13 DIAGNOSIS — I2510 Atherosclerotic heart disease of native coronary artery without angina pectoris: Secondary | ICD-10-CM

## 2023-07-13 DIAGNOSIS — I480 Paroxysmal atrial fibrillation: Secondary | ICD-10-CM

## 2023-07-13 DIAGNOSIS — Z01810 Encounter for preprocedural cardiovascular examination: Secondary | ICD-10-CM | POA: Diagnosis not present

## 2023-07-13 DIAGNOSIS — Z950 Presence of cardiac pacemaker: Secondary | ICD-10-CM

## 2023-07-13 DIAGNOSIS — E782 Mixed hyperlipidemia: Secondary | ICD-10-CM

## 2023-07-13 MED ORDER — FUROSEMIDE 20 MG PO TABS: 20.0000 mg | ORAL_TABLET | Freq: Every day | ORAL | 1 refills | Status: DC

## 2023-07-13 MED ORDER — NITROGLYCERIN 0.4 MG SL SUBL: 0.4000 mg | SUBLINGUAL_TABLET | SUBLINGUAL | 2 refills | Status: AC | PRN

## 2023-07-13 NOTE — Patient Instructions (Addendum)

## 2023-07-13 NOTE — Progress Notes (Unsigned)
 Cardiology Office Note:  .   Date:  07/13/2023 ID:  Calvin Cochran, DOB [birth year], MRN [id number] PCP: Soledad Gerlach, FNP  [facility] Providers Cardiologist:  Nona Dell, MD Electrophysiologist:  Lewayne Bunting, MD    History of Present Illness: Calvin Cochran   Calvin Cochran is a 83 y.o. male with a PMH of CAD, PAF, tachycardia-bradycardia syndrome, s/p PPM, mixed HLD, and CKD stage IIIb, who presents today for pre-op clearance.   Previous Gervasi history of DES to LAD in 2018.  Lexiscan Myoview in August 2024 was low risk, EF 65%.  Last seen by Dr. Diona Browner on March 01, 2023.  He is doing well at the time.  Today presents for preoperative cardiovascular risk assessment.  He is pending left total hip arthroplasty.  Surgeon will be Dr. Dorene Grebe of [facility] care in [place].  Today presents for follow-up.  He states he is doing well.  Denies any acute cardiac complaints or issues. Denies any chest pain, shortness of breath, palpitations, syncope, presyncope, dizziness, orthopnea, PND, swelling or significant weight changes, acute bleeding, or claudication.  ROS: Negative. See HPI.   SH: In his free time, he enjoys crossword puzzles, used to love long distance bicycling when he was more active, as well as hunting.   Studies Reviewed: Calvin Cochran   EKG Interpretation Date/Time:  Tuesday July 13 2023 08:40:33 EDT Ventricular Rate:  73 PR Interval:  206 QRS Duration:  72 QT Interval:  384 QTC Calculation: 423 R Axis:   15  Text Interpretation: Normal sinus rhythm Cannot rule out Anterior infarct , age undetermined When compared with ECG of 01-Jun-2023 10:15, Sinus rhythm has replaced Electronic ventricular pacemaker Confirmed by Sharlene Dory [phone]) on 07/13/2023 8:45:28 AM    Lexiscan 11/2022:    The study is normal. The study is low risk.   No ST deviation was noted.   LV perfusion is normal.   Left ventricular function is normal. Nuclear stress EF: 65%. The left ventricular ejection  fraction is normal (55-65%). End diastolic cavity size is normal.   Elevated TID nonspecific findings in absence of perfusion defect.  Echo 08/2021:  1. Left ventricular ejection fraction, by estimation, is 65 to 70%. The  left ventricle has normal function. The left ventricle has no regional  wall motion abnormalities. Left ventricular diastolic parameters are  indeterminate. The average left  ventricular global longitudinal strain is -20.0 %. The global longitudinal  strain is normal.   2. Right ventricular systolic function is normal. The right ventricular  size is normal. There is normal pulmonary artery systolic pressure.   3. The mitral valve was not well visualized. No evidence of mitral valve  regurgitation. No evidence of mitral stenosis.   4. The aortic valve is tricuspid. Aortic valve regurgitation is mild. No  aortic stenosis is present.   5. The inferior vena cava is normal in size with greater than 50%  respiratory variability, suggesting right atrial pressure of 3 mmHg.   Comparison(s): LVEF 60-65%.  LHC 01/2017:  RPDA lesion, 50 %stenosed. 1st Mrg lesion, 30 %stenosed. 2nd Diag-2 lesion, 90 %stenosed. 2nd Diag-1 lesion, 80 %stenosed. Prox LAD to Mid LAD lesion, 95 %stenosed. Post intervention, there is a 0% residual stenosis. A stent was successfully placed. The left ventricular systolic function is normal. LV end diastolic pressure is mildly elevated. The left ventricular ejection fraction is 55-65% by visual estimate.   IMPRESSION: Successful proximal LAD PCI and drug-eluting stenting using a 3 mm x 20 mm long Synergy  drug-eluting stent postdilated to 3.36 mm.  Patient had no other significant CAD.  His second diagonal branch was small and diffusely diseased.  Both the first and second diagonal branch which were crossed by the stent remained patent.  He had normal LV function.  He can be discharged home in the morning on dual antiplatelet therapy.  He should remain  on DA PT for at least one year uninterrupted.   Risk Assessment/Calculations:    CHA2DS2-VASc Score = 4   This indicates a 4.8% annual risk of stroke. [name] score is based upon: CHF History: 0 HTN History: 1 Diabetes History: 0 Stroke History: 0 Vascular Disease History: 1 Age Score: 2 Gender Score: 0  Physical Exam:   VS:  BP 130/82 (BP Location: Left Arm, Cuff Size: Normal)   Pulse 72   Ht 5\' 9"  (1.753 m)   Wt 194 lb 3.2 oz (88.1 kg)   SpO2 99%   BMI 28.68 kg/m    Wt Readings from Last 3 Encounters:  07/13/23 194 lb 3.2 oz (88.1 kg)  06/01/23 201 lb 6.4 oz (91.4 kg)  03/01/23 200 lb 9.6 oz (91 kg)    GEN: Well nourished, well developed in no acute distress NECK: No JVD; No carotid bruits CARDIAC: S1/S2, RRR, no murmurs, rubs, gallops RESPIRATORY:  Clear to auscultation without rales, wheezing or rhonchi  ABDOMEN: Soft, non-tender, non-distended EXTREMITIES:  No edema; No deformity   ASSESSMENT AND PLAN: .    Pre-operative cardiovascular risk assessment46} Mr. Daversa perioperative risk of a major cardiac event is 0.9% according to the Revised Cardiac Risk Index (RCRI).  Therefore, he is at low risk for perioperative complications.   His functional capacity is good at 5.07 METs according to the Duke Activity Status Index (DASI). Recommendations: According to ACC/AHA guidelines, no further cardiovascular testing needed.  The patient may proceed to surgery at acceptable risk.   Antiplatelet and/or Anticoagulation Recommendations: Per office protocol, patient can hold Eliquis for 3 days prior to procedure.  Please resume when medically safe to do so.  Will route note to requesting party.  2. PAF Denies any tachycardia or palpitations.  He is in sinus rhythm on exam today.  No medication changes.  Continue Eliquis for stroke prevention.  Continue to follow-up with EP.  3. CAD Stable with no anginal symptoms. No indication for ischemic evaluation.  Not on aspirin  due to being on Eliquis.  Continue current medication regimen.  Will refill nitroglycerin per his request. Heart healthy diet and regular cardiovascular exercise encouraged.  Will request most recent labs from PCP's office.  4.  Tachycardia-bradycardia syndrome, s/p PPM Most recent remote device check showed normal device function.  Denies any issues.  Continue follow-up with EP as scheduled.  5.  Mixed hyperlipidemia No recent labs on file.  Will request most recent labs from PCPs office.  Continue current medication regimen. Heart healthy diet and regular cardiovascular exercise encouraged.   6. CKD stage 3b Will request most recent labs from PCPs office.  Continue current medication regimen.  Avoid nephrotoxic agents. Continue to follow with PCP.  Dispo: Will provide refills per his request.  Follow-up with Dr. Diona Browner or APP in 1 year or sooner if anything changes.  Signed, Sharlene Dory, NP

## 2023-07-19 ENCOUNTER — Other Ambulatory Visit: Payer: Self-pay

## 2023-07-19 ENCOUNTER — Encounter (HOSPITAL_COMMUNITY): Payer: Self-pay

## 2023-07-19 ENCOUNTER — Encounter (HOSPITAL_COMMUNITY)
Admission: RE | Admit: 2023-07-19 | Discharge: 2023-07-19 | Disposition: A | Discharge status: Home / Self Care | Source: Ambulatory Visit | Attending: Orthopedic Surgery | Admitting: Orthopedic Surgery

## 2023-07-19 ENCOUNTER — Other Ambulatory Visit: Payer: Self-pay | Admitting: Nurse Practitioner

## 2023-07-19 ENCOUNTER — Telehealth: Payer: Self-pay

## 2023-07-19 VITALS — BP 96/72 | HR 102 | Temp 97.8°F | Resp 18 | Ht 69.0 in | Wt 194.0 lb

## 2023-07-19 DIAGNOSIS — I252 Old myocardial infarction: Secondary | ICD-10-CM | POA: Diagnosis not present

## 2023-07-19 DIAGNOSIS — I10 Essential (primary) hypertension: Secondary | ICD-10-CM | POA: Insufficient documentation

## 2023-07-19 DIAGNOSIS — Z01812 Encounter for preprocedural laboratory examination: Secondary | ICD-10-CM | POA: Diagnosis present

## 2023-07-19 DIAGNOSIS — I480 Paroxysmal atrial fibrillation: Secondary | ICD-10-CM | POA: Diagnosis not present

## 2023-07-19 DIAGNOSIS — I495 Sick sinus syndrome: Secondary | ICD-10-CM | POA: Diagnosis not present

## 2023-07-19 DIAGNOSIS — I2510 Atherosclerotic heart disease of native coronary artery without angina pectoris: Secondary | ICD-10-CM | POA: Insufficient documentation

## 2023-07-19 DIAGNOSIS — E785 Hyperlipidemia, unspecified: Secondary | ICD-10-CM

## 2023-07-19 DIAGNOSIS — M1612 Unilateral primary osteoarthritis, left hip: Secondary | ICD-10-CM | POA: Insufficient documentation

## 2023-07-19 DIAGNOSIS — Z01818 Encounter for other preprocedural examination: Secondary | ICD-10-CM

## 2023-07-19 HISTORY — DX: Dyspnea, unspecified: R06.00

## 2023-07-19 LAB — URINALYSIS, W/ REFLEX TO CULTURE (INFECTION SUSPECTED)
Bilirubin Urine: NEGATIVE
Glucose, UA: NEGATIVE mg/dL
Hgb urine dipstick: NEGATIVE
Ketones, ur: NEGATIVE mg/dL
Leukocytes,Ua: NEGATIVE
Nitrite: NEGATIVE
Protein, ur: NEGATIVE mg/dL
Specific Gravity, Urine: 1.016 (ref 1.005–1.030)
pH: 5 (ref 5.0–8.0)

## 2023-07-19 LAB — CBC
HCT: 44.9 % (ref 39.0–52.0)
Hemoglobin: 14.9 g/dL (ref 13.0–17.0)
MCH: 31.2 pg (ref 26.0–34.0)
MCHC: 33.2 g/dL (ref 30.0–36.0)
MCV: 94.1 fL (ref 80.0–100.0)
Platelets: 228 10*3/uL (ref 150–400)
RBC: 4.77 MIL/uL (ref 4.22–5.81)
RDW: 13.1 % (ref 11.5–15.5)
WBC: 9.3 10*3/uL (ref 4.0–10.5)
nRBC: 0 % (ref 0.0–0.2)

## 2023-07-19 LAB — BASIC METABOLIC PANEL WITH GFR
Anion gap: 10 (ref 5–15)
BUN: 19 mg/dL (ref 8–23)
CO2: 26 mmol/L (ref 22–32)
Calcium: 9.1 mg/dL (ref 8.9–10.3)
Chloride: 103 mmol/L (ref 98–111)
Creatinine, Ser: 1.28 mg/dL — ABNORMAL HIGH (ref 0.61–1.24)
GFR, Estimated: 57 mL/min — ABNORMAL LOW (ref >60)
Glucose, Bld: 106 mg/dL — ABNORMAL HIGH (ref 70–99)
Potassium: 3.7 mmol/L (ref 3.5–5.1)
Sodium: 139 mmol/L (ref 135–145)

## 2023-07-19 LAB — TYPE AND SCREEN
ABO/RH(D): A POS
Antibody Screen: NEGATIVE

## 2023-07-19 LAB — SURGICAL PCR SCREEN
MRSA, PCR: NEGATIVE
Staphylococcus aureus: NEGATIVE

## 2023-07-19 MED ORDER — ATORVASTATIN CALCIUM 80 MG PO TABS: 80.0000 mg | ORAL_TABLET | Freq: Every day | ORAL | 1 refills | Status: AC

## 2023-07-19 NOTE — Telephone Encounter (Signed)
 Outreach made to Pt.  Pt needs device clearance for upcoming hip surgery.  He has not been seen in office in over a year.  Follow up appt made with Dr. Carolynne Citron.  Pt aware and in agreement.  Sent directions via MyChart.

## 2023-07-19 NOTE — Progress Notes (Addendum)
 Surgical Instructions   Your procedure is scheduled on Tuesday, April 22nd, 2025. Report to [facility] Main Entrance "A" at 11:45 A.M., then check in with the Admitting office. Any questions or running late day of surgery: call [phone]  Questions prior to your surgery date: call [phone], Monday-Friday, 8am-4pm. If you experience any cold or flu symptoms such as cough, fever, chills, shortness of breath, etc. between now and your scheduled surgery, please notify us at the above number.     Remember:  Do not eat after midnight the night before your surgery  You may drink clear liquids until 10:45 the morning of your surgery.   Clear liquids allowed are: Water, Non-Citrus Juices (without pulp), Carbonated Beverages, Clear Tea (no milk, honey, etc.), Black Coffee Only (NO MILK, CREAM OR POWDERED CREAMER of any kind), and Gatorade.  Patient Instructions  The night before surgery:  No food after midnight. ONLY clear liquids after midnight  The day of surgery (if you do NOT have diabetes):  Drink ONE (1) Pre-Surgery Clear Ensure by 10:45 the morning of surgery. Drink in one sitting. Do not sip.  This drink was given to you during your hospital  pre-op appointment visit.  Nothing else to drink after completing the  Pre-Surgery Clear Ensure.         If you have questions, please contact your surgeon's office.     Take these medicines the morning of surgery with A SIP OF WATER: Amlodipine (Norvasc) Atorvastatin (Lipitor) Dofetilide (Tikosyn) Metoprolol Succinate (Toprol-XL)   May take these medicines IF NEEDED: Nitroglycerin    Per your cardiologist, your Apixaban (Eliquis) should be held for 3 days prior to your procedure.  Your last dose should be on Friday, April 18th.      One week prior to surgery, STOP taking any Aspirin (unless otherwise instructed by your surgeon) Aleve, Naproxen, Ibuprofen, Motrin, Advil, Goody's, BC's, all herbal medications, fish oil, and  non-prescription vitamins.                     Do NOT Smoke (Tobacco/Vaping) for 24 hours prior to your procedure.  If you use a CPAP at night, you may bring your mask/headgear for your overnight stay.   You will be asked to remove any contacts, glasses, piercing's, hearing aid's, dentures/partials prior to surgery. Please bring cases for these items if needed.    Patients discharged the day of surgery will not be allowed to drive home, and someone needs to stay with them for 24 hours.  SURGICAL WAITING ROOM VISITATION Patients may have no more than 2 support people in the waiting area - these visitors may rotate.   Pre-op nurse will coordinate an appropriate time for 1 ADULT support person, who may not rotate, to accompany patient in pre-op.  Children under the age of 31 must have an adult with them who is not the patient and must remain in the main waiting area with an adult.  If the patient needs to stay at the hospital during part of their recovery, the visitor guidelines for inpatient rooms apply.  Please refer to the [facility] website for the visitor guidelines for any additional information.   If you received a COVID test during your pre-op visit  it is requested that you wear a mask when out in public, stay away from anyone that may not be feeling well and notify your surgeon if you develop symptoms. If you have been in contact with anyone that has  tested positive in the last 10 days please notify you surgeon.      Pre-operative 5 CHG Bathing Instructions   You can play a key role in reducing the risk of infection after surgery. Your skin needs to be as free of germs as possible. You can reduce the number of germs on your skin by washing with CHG (chlorhexidine gluconate) soap before surgery. CHG is an antiseptic soap that kills germs and continues to kill germs even after washing.   DO NOT use if you have an allergy to chlorhexidine/CHG or antibacterial soaps. If your skin  becomes reddened or irritated, stop using the CHG and notify one of our RNs at [phone].   Please shower with the CHG soap starting 4 days before surgery using the following schedule:     Please keep in mind the following:  DO NOT shave, including legs and underarms, starting the day of your first shower.   You may shave your face at any point before/day of surgery.  Place clean sheets on your bed the day you start using CHG soap. Use a clean washcloth (not used since being washed) for each shower. DO NOT sleep with pets once you start using the CHG.   CHG Shower Instructions:  Wash your face and private area with normal soap. If you choose to wash your hair, wash first with your normal shampoo.  After you use shampoo/soap, rinse your hair and body thoroughly to remove shampoo/soap residue.  Turn the water OFF and apply about 3 tablespoons (45 ml) of CHG soap to a CLEAN washcloth.  Apply CHG soap ONLY FROM YOUR NECK DOWN TO YOUR TOES (washing for 3-5 minutes)  DO NOT use CHG soap on face, private areas, open wounds, or sores.  Pay special attention to the area where your surgery is being performed.  If you are having back surgery, having someone wash your back for you may be helpful. Wait 2 minutes after CHG soap is applied, then you may rinse off the CHG soap.  Pat dry with a clean towel  Put on clean clothes/pajamas   If you choose to wear lotion, please use ONLY the CHG-compatible lotions that are listed below.  Additional instructions for the day of surgery: DO NOT APPLY any lotions, deodorants, cologne, or perfumes.   Do not bring valuables to the hospital. [facility] is not responsible for any belongings/valuables. Do not wear nail polish, gel polish, artificial nails, or any other type of covering on natural nails (fingers and toes) Do not wear jewelry or makeup Put on clean/comfortable clothes.  Please brush your teeth.  Ask your nurse before applying any prescription  medications to the skin.     CHG Compatible Lotions   Aveeno Moisturizing lotion  Cetaphil Moisturizing Cream  Cetaphil Moisturizing Lotion  Clairol Herbal Essence Moisturizing Lotion, Dry Skin  Clairol Herbal Essence Moisturizing Lotion, Extra Dry Skin  Clairol Herbal Essence Moisturizing Lotion, Normal Skin  Curel Age Defying Therapeutic Moisturizing Lotion with Alpha Hydroxy  Curel Extreme Care Body Lotion  Curel Soothing Hands Moisturizing Hand Lotion  Curel Therapeutic Moisturizing Cream, Fragrance-Free  Curel Therapeutic Moisturizing Lotion, Fragrance-Free  Curel Therapeutic Moisturizing Lotion, Original Formula  Eucerin Daily Replenishing Lotion  Eucerin Dry Skin Therapy Plus Alpha Hydroxy Crme  Eucerin Dry Skin Therapy Plus Alpha Hydroxy Lotion  Eucerin Original Crme  Eucerin Original Lotion  Eucerin Plus Crme Eucerin Plus Lotion  Eucerin TriLipid Replenishing Lotion  Keri Anti-Bacterial Hand Lotion  Keri Deep Conditioning  Original Lotion Dry Skin Formula Softly Scented  Keri Deep Conditioning Original Lotion, Fragrance Free Sensitive Skin Formula  Keri Lotion Fast Absorbing Fragrance Free Sensitive Skin Formula  Keri Lotion Fast Absorbing Softly Scented Dry Skin Formula  Keri Original Lotion  Keri Skin Renewal Lotion Keri Silky Smooth Lotion  Keri Silky Smooth Sensitive Skin Lotion  Nivea Body Creamy Conditioning Oil  Nivea Body Extra Enriched Lotion  Nivea Body Original Lotion  Nivea Body Sheer Moisturizing Lotion Nivea Crme  Nivea Skin Firming Lotion  NutraDerm 30 Skin Lotion  NutraDerm Skin Lotion  NutraDerm Therapeutic Skin Cream  NutraDerm Therapeutic Skin Lotion  ProShield Protective Hand Cream  Provon moisturizing lotion  Please read over the following fact sheets that you were given.

## 2023-07-19 NOTE — Progress Notes (Signed)
 Calvin Cochran from [place]. Calvin Cochran is aware of date and arrival time for procedure and states that they will come and check patient prior to the procedure.

## 2023-07-19 NOTE — Progress Notes (Addendum)
 PCP - Emi Hanson Cardiologist - Dr. Teddie Favre EP- Dr. Manya Sells  PPM/ICD - St. Jude Pacemaker Device Orders - sent on 07/19/23 Rep Notified - left message with [employer]. Jude/Abbott on 4/14 at 1526  Patient is scheduled to see device clinic on 4/17.    Chest x-ray - denies EKG - 07/13/23 Stress Test - 11/20/22 ECHO - 08/12/21 Cardiac Cath -09/01/16   Sleep Study - denies CPAP - n/a  No DM  Last dose of GLP1 agonist-  n/a GLP1 instructions:  n/a  Blood Thinner Instructions:  hold eliquis for 3 days - last dose is Friday, April 18th Aspirin Instructions: n/a  ERAS Protcol - clears until 1045 PRE-SURGERY Ensure or G2- ensure as ordered  COVID TEST- n/a   Anesthesia review: yes - cardiac history/clearance  Cardiac clearance note - 07/13/23  Patient denies shortness of breath, fever, cough and chest pain at PAT appointment   All instructions explained to the patient, with a verbal understanding of the material. Patient agrees to go over the instructions while at home for a better understanding. Patient also instructed to self quarantine after being tested for COVID-19. The opportunity to ask questions was provided.

## 2023-07-20 NOTE — Progress Notes (Signed)
 Anesthesia Chart Review:  Case: [id number] Date/Time: 07/27/23 1231   Procedure: ARTHROPLASTY, HIP, TOTAL, ANTERIOR APPROACH (Left: Hip)   Anesthesia type: Spinal   Diagnosis: Primary osteoarthritis of left hip [M16.12]   Pre-op diagnosis: left hip osteoarthritis   [facility]   Surgeons: Cammy Copa, MD       DISCUSSION: Patient is an 83 year old male scheduled for the above procedure.  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  He had preoperative evaluation on 07/13/23 by Sharlene Dory, NP.  NSTEMI s/p DES to LAD in 2018. Low risk stress test, EF 65% 11/2022. Echo 08/2021 showed LVEF 65 to 70%, no regional wall motion abnormalities, normal RV systolic function, normal PASP, mild AR. He was in SR. he denied chest pain.  Normal device function at last remote interrogation.  1 year follow-up planned.  She wrote, "Pre-operative cardiovascular risk assessment46} Mr. Garbers perioperative risk of a major cardiac event is 0.9% according to the Revised Cardiac Risk Index (RCRI).  Therefore, he is at low risk for perioperative complications.   His functional capacity is good at 5.07 METs according to the Duke Activity Status Index (DASI). Recommendations: According to ACC/AHA guidelines, no further cardiovascular testing needed.  The patient may proceed to surgery at acceptable risk.   Antiplatelet and/or Anticoagulation Recommendations: Per office protocol, patient can hold Eliquis for 3 days prior to procedure.  Please resume when medically safe to do so. Last Eliquis planned for 07/23/23.    He is scheduled for routine EP follow-up on 07/22/23.  ADDENDUM 07/22/23 5:20 PM: He had EP follow-up with Dr. Ladona Ridgel today.  PPM interrogation showed normal function.  He was not pacer dependent.  He was out of rhythm about 11% of the time.  He was doing well  from CAD standpoint.  Continue Lasix for dyspnea.  Dr. Ladona Ridgel wrote, "he is an acceptable risk for hip replacement. His eliquis can be restarted after surgery when his bleeding risk is acceptable." Perioperative device recommendations:  Device Information: Clinic EP Physician:  Lewayne Bunting, MD  Device Type:  Pacemaker Manufacturer and Phone #:  St. Jude/Abbott: [phone] Pacemaker Dependent?:  No. Date of Last Device Check:  07/22/2023       Normal Device Function?:  Yes.     Electrophysiologist's Recommendations:  Have magnet available. Provide continuous ECG monitoring when magnet is used or reprogramming is to be performed.  Procedure should not interfere with device function.  No device programming or magnet placement needed.  Anesthesia team to evaluate on the day of surgery.   VS: BP 96/72   Pulse (!) 102   Temp 36.6 C   Resp 18   Ht 5\' 9"  (1.753 m)   Wt 88 kg   SpO2 100%   BMI 28.65 kg/m   PROVIDERS: Soledad Gerlach, FNP is PCP  Nona Dell, MD is primary cardiologist Sharrell Ku, MD is EP cardiologist   LABS: Labs reviewed: Acceptable for surgery. (all labs ordered are listed, but only abnormal results are displayed)  Labs Reviewed  BASIC METABOLIC PANEL WITH GFR - Abnormal; Notable for the following components:      Result Value   Glucose, Bld 106 (*)    Creatinine, Ser 1.28 (*)    GFR, Estimated 57 (*)    All other components within normal limits  URINALYSIS, W/ REFLEX TO CULTURE (INFECTION SUSPECTED) - Abnormal;  Notable for the following components:   Bacteria, UA RARE (*)    All other components within normal limits  SURGICAL PCR SCREEN  CBC  TYPE AND SCREEN     IMAGES: MRI L-spine 07/12/23: Report in process.   EKG: 07/13/23: Normal sinus rhythm Cannot rule out Anterior infarct , age undetermined When compared with ECG of 01-Jun-2023 10:15, Sinus rhythm has replaced Electronic ventricular pacemaker Confirmed by Sharlene Dory [phone]) on  07/13/2023 8:45:28 AM   CV: Nuclear stress test 11/20/22:   The study is normal. The study is low risk.   No ST deviation was noted.   LV perfusion is normal.   Left ventricular function is normal. Nuclear stress EF: 65%. The left ventricular ejection fraction is normal (55-65%). End diastolic cavity size is normal.   Elevated TID nonspecific findings in absence of perfusion defect.    Echo 08/12/21: IMPRESSIONS   1. Left ventricular ejection fraction, by estimation, is 65 to 70%. The  left ventricle has normal function. The left ventricle has no regional  wall motion abnormalities. Left ventricular diastolic parameters are  indeterminate. The average left  ventricular global longitudinal strain is -20.0 %. The global longitudinal  strain is normal.   2. Right ventricular systolic function is normal. The right ventricular  size is normal. There is normal pulmonary artery systolic pressure.   3. The mitral valve was not well visualized. No evidence of mitral valve  regurgitation. No evidence of mitral stenosis.   4. The aortic valve is tricuspid. Aortic valve regurgitation is mild. No  aortic stenosis is present.   5. The inferior vena cava is normal in size with greater than 50%  respiratory variability, suggesting right atrial pressure of 3 mmHg.  - Comparison(s): LVEF 60-65%.    Pacemaker Implant 09/09/20:  CONCLUSIONS:   1. Successful implantation of a St. Jude dual-chamber pacemaker for symptomatic bradycardia due to sinus node dysfunction and tachy-brady syndrome.  2. No early apparent complications.      Long term monitor 07/18/20: ZIO XT reviewed. 13 days 21 hours analyzed. Predominant rhythm is sinus with heart rate ranging from 37 bpm up to 162 bpm and average heart rate 66 bpm. There were occasional PACs representing 1.7% total beats, rare atrial couplets and triplets representing less than 1% total beats. There were also rare PVCs representing less than 1% total beats  including couplets and triplets with some ventricular bigeminy and trigeminy. Brief episodes of NSVT noted, the longest of which was 8 beats. There was also paroxysmal atrial flutter and atrial fibrillation representing overall 22% rhythm burden with average heart rate in the 80s and longest episode lasting just over an hour. In addition there were several pauses noted, the longest of which lasted 4.6 seconds. Many of the documented pauses occurred post-termination from atrial flutter and fibrillation.    US Carotid 06/27/19: Summary:  Right Carotid: Velocities in the right ICA are consistent with a 1-39%  stenosis.  Left Carotid: Velocities in the left ICA are consistent with a 1-39%  stenosis.  Vertebrals: Bilateral vertebral arteries demonstrate antegrade flow.  Subclavians: Normal flow hemodynamics were seen in bilateral subclavian               arteries.    Cardiac cath/PCI 02/01/17: RPDA lesion, 50 %stenosed. 1st Mrg lesion, 30 %stenosed. 2nd Diag-2 lesion, 90 %stenosed. 2nd Diag-1 lesion, 80 %stenosed. Prox LAD to Mid LAD lesion, 95 %stenosed. Post intervention, there is a 0% residual stenosis. A stent was successfully placed.  The left ventricular systolic function is normal. LV end diastolic pressure is mildly elevated. The left ventricular ejection fraction is 55-65% by visual estimate. IMPRESSION: Successful proximal LAD PCI and drug-eluting stenting using a 3 mm x 20 mm long Synergy drug-eluting stent postdilated to 3.36 mm.  Patient had no other significant CAD.  His second diagonal branch was small and diffusely diseased.  Both the first and second diagonal branch which were crossed by the stent remained patent.  He had normal LV function.  He can be discharged home in the morning on dual antiplatelet therapy.  He should remain on DA PT for at least one year uninterrupted.    Past Medical History:  Diagnosis Date   CAD (coronary artery disease)    DES to mid LAD October  2018   Dyspnea    with exertion   Essential hypertension    NSTEMI (non-ST elevated myocardial infarction) (HCC) 01/2017   Paroxysmal atrial fibrillation (HCC)    Tachycardia-bradycardia syndrome [facility])    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 Carolynne Citron    Past Surgical History:  Procedure Laterality Date   COLONOSCOPY     CORONARY STENT INTERVENTION N/A 02/01/2017   Procedure: CORONARY STENT INTERVENTION;  Surgeon: Avanell Leigh, MD;  [facility];  Service: Cardiovascular;  Laterality: N/A;  LAD Prox-Mid lesion stented   LEFT HEART CATH AND CORONARY ANGIOGRAPHY N/A 02/01/2017   Procedure: LEFT HEART CATH AND CORONARY ANGIOGRAPHY;  Surgeon: Avanell Leigh, MD;  [facility];  Service: Cardiovascular;  Laterality: N/A;   PACEMAKER IMPLANT N/A 09/09/2020   Procedure: PACEMAKER IMPLANT;  Surgeon: Tammie Fall, MD;  [facility];  Service: Cardiovascular;  Laterality: N/A;    MEDICATIONS:  amLODipine (NORVASC) 10 MG tablet   apixaban (ELIQUIS) 2.5 MG TABS tablet   atorvastatin (LIPITOR) 80 MG tablet   dofetilide (TIKOSYN) 125 MCG capsule   furosemide (LASIX) 20 MG tablet   Lidocaine, Anorectal, (TOPICAINE 5) 5 % GEL   losartan (COZAAR) 50 MG tablet   MAGNESIUM EXTRA STRENGTH 400 MG CAPS   metoprolol succinate (TOPROL-XL) 25 MG 24 hr tablet   nitroGLYCERIN (NITROSTAT) 0.4 MG SL tablet   potassium chloride SA (KLOR-CON M) 20 MEQ tablet   No current facility-administered medications for this encounter.    Ella Gun, PA-C Surgical Short Stay/Anesthesiology [facility] Phone [phone] [facility] Phone [phone] 07/20/2023 4:26 PM

## 2023-07-20 NOTE — Anesthesia Preprocedure Evaluation (Addendum)
 Anesthesia Evaluation  Patient identified by MRN, date of birth, ID band Patient awake    Reviewed: Allergy & Precautions, NPO status , Patient's Chart, lab work & pertinent test results, reviewed documented beta blocker date and time   Airway Mallampati: IV  TM Distance: >3 FB Neck ROM: Full    Dental  (+) Teeth Intact, Dental Advisory Given   Pulmonary shortness of breath and with exertion   Pulmonary exam normal breath sounds clear to auscultation       Cardiovascular hypertension (136/81 preop), Pt. on medications and Pt. on home beta blockers + CAD, + Past MI and + Cardiac Stents (2018 DES to LAD)  + dysrhythmias Atrial Fibrillation + pacemaker (2022 tachy-brady syn)  Rhythm:Regular Rate:Normal  Echo 2023  1. Left ventricular ejection fraction, by estimation, is 65 to 70%. The  left ventricle has normal function. The left ventricle has no regional  wall motion abnormalities. Left ventricular diastolic parameters are  indeterminate. The average left  ventricular global longitudinal strain is -20.0 %. The global longitudinal  strain is normal.   2. Right ventricular systolic function is normal. The right ventricular  size is normal. There is normal pulmonary artery systolic pressure.   3. The mitral valve was not well visualized. No evidence of mitral valve  regurgitation. No evidence of mitral stenosis.   4. The aortic valve is tricuspid. Aortic valve regurgitation is mild. No  aortic stenosis is present.   5. The inferior vena cava is normal in size with greater than 50%  respiratory variability, suggesting right atrial pressure of 3 mmHg.     Occasional Aflutter (rate in teens) when ppm rep interrogating device  Magnet rate of ppm is 100bpm    Neuro/Psych negative neurological ROS  negative psych ROS   GI/Hepatic negative GI ROS, Neg liver ROS,,,  Endo/Other  negative endocrine ROS    Renal/GU Renal  InsufficiencyRenal diseaseCr 1.28  negative genitourinary   Musculoskeletal negative musculoskeletal ROS (+)    Abdominal   Peds  Hematology negative hematology ROS (+) Hb 14.9, plt 228   Anesthesia Other Findings Eliquis  LD: 4/18 PM   Reproductive/Obstetrics negative OB ROS                             Anesthesia Physical Anesthesia Plan  ASA: 3  Anesthesia Plan: MAC and Spinal   Post-op Pain Management: Tylenol  PO (pre-op)*   Induction: Intravenous  PONV Risk Score and Plan: Ondansetron , Propofol  infusion, TIVA and Treatment may vary due to age or medical condition  Airway Management Planned: Natural Airway and Simple Face Mask  Additional Equipment: None  Intra-op Plan:   Post-operative Plan:   Informed Consent: I have reviewed the patients History and Physical, chart, labs and discussed the procedure including the risks, benefits and alternatives for the proposed anesthesia with the patient or authorized representative who has indicated his/her understanding and acceptance.     Dental advisory given  Plan Discussed with: CRNA  Anesthesia Plan Comments:        Anesthesia Quick Evaluation

## 2023-07-21 ENCOUNTER — Telehealth: Payer: Self-pay

## 2023-07-21 NOTE — Telephone Encounter (Signed)
 Can either you please see if we have disc of images on this patient?

## 2023-07-22 ENCOUNTER — Encounter: Payer: Self-pay | Admitting: Internal Medicine

## 2023-07-22 ENCOUNTER — Other Ambulatory Visit: Payer: Self-pay | Admitting: Surgical

## 2023-07-22 ENCOUNTER — Ambulatory Visit: Attending: Internal Medicine | Admitting: Internal Medicine

## 2023-07-22 ENCOUNTER — Telehealth: Payer: Self-pay | Admitting: Orthopedic Surgery

## 2023-07-22 VITALS — BP 120/80 | HR 78 | Ht 69.0 in | Wt 191.2 lb

## 2023-07-22 DIAGNOSIS — I10 Essential (primary) hypertension: Secondary | ICD-10-CM | POA: Diagnosis not present

## 2023-07-22 DIAGNOSIS — I495 Sick sinus syndrome: Secondary | ICD-10-CM

## 2023-07-22 LAB — CUP PACEART INCLINIC DEVICE CHECK
Battery Remaining Longevity: 91 mo
Battery Voltage: 3.01 V
Brady Statistic RA Percent Paced: 51 %
Brady Statistic RV Percent Paced: 6.7 %
Date Time Interrogation Session: 20250417150247
Implantable Lead Connection Status: 753985
Implantable Lead Connection Status: 753985
Implantable Lead Implant Date: 20220606
Implantable Lead Implant Date: 20220606
Implantable Lead Location: 753859
Implantable Lead Location: 753860
Implantable Pulse Generator Implant Date: 20220606
Lead Channel Impedance Value: 425 Ohm
Lead Channel Impedance Value: 462.5 Ohm
Lead Channel Pacing Threshold Amplitude: 0.5 V
Lead Channel Pacing Threshold Amplitude: 0.5 V
Lead Channel Pacing Threshold Amplitude: 1 V
Lead Channel Pacing Threshold Amplitude: 1 V
Lead Channel Pacing Threshold Pulse Width: 0.5 ms
Lead Channel Pacing Threshold Pulse Width: 0.5 ms
Lead Channel Pacing Threshold Pulse Width: 0.5 ms
Lead Channel Pacing Threshold Pulse Width: 0.5 ms
Lead Channel Sensing Intrinsic Amplitude: 12 mV
Lead Channel Sensing Intrinsic Amplitude: 4.4 mV
Lead Channel Setting Pacing Amplitude: 2 V
Lead Channel Setting Pacing Amplitude: 2.5 V
Lead Channel Setting Pacing Pulse Width: 0.5 ms
Lead Channel Setting Sensing Sensitivity: 2 mV
Pulse Gen Model: 2272
Pulse Gen Serial Number: 6460902

## 2023-07-22 MED ORDER — TRAMADOL HCL 50 MG PO TABS: 50.0000 mg | ORAL_TABLET | Freq: Every day | ORAL | 0 refills | Status: DC | PRN

## 2023-07-22 NOTE — Telephone Encounter (Signed)
 Lvm advising

## 2023-07-22 NOTE — Telephone Encounter (Signed)
 I did not find one.

## 2023-07-22 NOTE — Patient Instructions (Signed)

## 2023-07-22 NOTE — Progress Notes (Signed)
 PERIOPERATIVE PRESCRIPTION FOR IMPLANTED CARDIAC DEVICE PROGRAMMING  Patient Information: Name:  Calvin Cochran  DOB:  08/21/1940  MRN:  [id number]  Planned Procedure:  Left Total Hip Arthroplasty  Surgeon:  Dr. Lynita Saris  Date of Procedure:  07/27/23  Cautery will be used.  Position during surgery:  supine or lateral  Device Information:  Clinic EP Physician:  Manya Sells, MD   Device Type:  Pacemaker Manufacturer and Phone #:  St. Jude/Abbott: [phone] Pacemaker Dependent?:  No. Date of Last Device Check:  07/22/2023 Normal Device Function?:  Yes.    Electrophysiologist's Recommendations:  Have magnet available. Provide continuous ECG monitoring when magnet is used or reprogramming is to be performed.  Procedure should not interfere with device function.  No device programming or magnet placement needed.  Per Device Clinic Standing Orders, Forrestine Ike, RN  3:06 PM 07/22/2023

## 2023-07-22 NOTE — Progress Notes (Signed)
 HPI Mr. Sheerin returns today for followup. He is a pleasant 83 yo man with atrial fib and tachybrady syndrome s/p PPM insertion almost 3 years ago. In the interim he has had some sob and he notes that his wife states that he snores loudly and at times stops breathing. He is pending a possible spinal injection. He does not have palpitations. He was out of rhythm about 20% of the time and was admitted a year ago for initiation of dofetilide.   He is pending hip surgery in a few days.  Allergies  Allergen Reactions   Amoxicillin Itching     Current Outpatient Medications  Medication Sig Dispense Refill   amLODipine (NORVASC) 10 MG tablet TAKE 1 TABLET BY MOUTH EVERY DAY 90 tablet 2   apixaban (ELIQUIS) 2.5 MG TABS tablet Take 1 tablet (2.5 mg total) by mouth 2 (two) times daily. 60 tablet 5   atorvastatin (LIPITOR) 80 MG tablet Take 1 tablet (80 mg total) by mouth daily. 90 tablet 1   dofetilide (TIKOSYN) 125 MCG capsule TAKE 1 CAPSULE BY MOUTH TWICE DAILY keep upcoming appt 180 capsule 0   furosemide (LASIX) 20 MG tablet Take 1 tablet (20 mg total) by mouth daily. 90 tablet 1   Lidocaine, Anorectal, (TOPICAINE 5) 5 % GEL Apply 1 Application topically daily as needed (pain).     losartan (COZAAR) 50 MG tablet TAKE 1 TABLET BY MOUTH EVERY DAY 90 tablet 2   MAGNESIUM EXTRA STRENGTH 400 MG CAPS TAKE 1 CAPSULE BY MOUTH DAILY. 90 capsule 1   metoprolol succinate (TOPROL-XL) 25 MG 24 hr tablet Take 0.5 tablets (12.5 mg total) by mouth daily. 45 tablet 2   nitroGLYCERIN (NITROSTAT) 0.4 MG SL tablet Place 1 tablet (0.4 mg total) under the tongue every 5 (five) minutes x 3 doses as needed for chest pain. 25 tablet 2   potassium chloride SA (KLOR-CON M) 20 MEQ tablet Take 2 tablets (40 mEq total) by mouth daily. 180 tablet 2   traMADol (ULTRAM) 50 MG tablet Take 1 tablet (50 mg total) by mouth daily as needed. 10 tablet 0   No current facility-administered medications for this visit.     Past  Medical History:  Diagnosis Date   CAD (coronary artery disease)    DES to mid LAD October 2018   Dyspnea    with exertion   Essential hypertension    NSTEMI (non-ST elevated myocardial infarction) (HCC) 01/2017   Paroxysmal atrial fibrillation (HCC)    Tachycardia-bradycardia syndrome [facility])    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 Ladona Ridgel    ROS:   All systems reviewed and negative except as noted in the HPI.   Past Surgical History:  Procedure Laterality Date   COLONOSCOPY     CORONARY STENT INTERVENTION N/A 02/01/2017   Procedure: CORONARY STENT INTERVENTION;  Surgeon: Runell Gess, MD;  [facility];  Service: Cardiovascular;  Laterality: N/A;  LAD Prox-Mid lesion stented   LEFT HEART CATH AND CORONARY ANGIOGRAPHY N/A 02/01/2017   Procedure: LEFT HEART CATH AND CORONARY ANGIOGRAPHY;  Surgeon: Runell Gess, MD;  [facility];  Service: Cardiovascular;  Laterality: N/A;   PACEMAKER IMPLANT N/A 09/09/2020   Procedure: PACEMAKER IMPLANT;  Surgeon: Marinus Maw, MD;  [facility];  Service: Cardiovascular;  Laterality: N/A;     Family History  Problem Relation Age of Onset   Lung cancer Father  Social History   Socioeconomic History   Marital status: Married    Spouse name: Not on file   Number of children: Not on file   Years of education: Not on file   Highest education level: Not on file  Occupational History   Not on file  Tobacco Use   Smoking status: Never   Smokeless tobacco: Never   Tobacco comments:    Never smoke 06/11/22  Vaping Use   Vaping status: Never Used  Substance and Sexual Activity   Alcohol use: Yes    Alcohol/week: 1.0 standard drink of alcohol    Types: 1 Shots of liquor per week    Comment: 1 drink every couple of weeks   Drug use: No   Sexual activity: Not on file  Other Topics Concern   Not on file  Social History Narrative   Not on file   Social Drivers of Health    Financial Resource Strain: Low Risk  (05/03/2022)   Received from [facility], [facility]   Overall Financial Resource Strain (CARDIA)    Difficulty of Paying Living Expenses: Not hard at all  Food Insecurity: No Food Insecurity (06/01/2022)   Hunger Vital Sign    Worried About Running Out of Food in the Last Year: Never true    Ran Out of Food in the Last Year: Never true  Transportation Needs: No Transportation Needs (06/01/2022)   PRAPARE - Administrator, Civil Service (Medical): No    Lack of Transportation (Non-Medical): No  Physical Activity: Insufficiently Active (05/03/2022)   Received from [facility], [facility]   Exercise Vital Sign    Days of Exercise per Week: 1 day    Minutes of Exercise per Session: 30 min  Stress: No Stress Concern Present (05/03/2022)   Received from [facility], [facility] - Occupational Stress Questionnaire    Feeling of Stress : Not at all  Social Connections: Moderately Integrated (05/03/2022)   Received from [facility], [facility]   Social Network    How would you rate your social network (family, work, friends)?: Adequate participation with social networks  Intimate Partner Violence: Not At Risk (06/01/2022)   Humiliation, Afraid, Rape, and Kick questionnaire    Fear of Current or Ex-Partner: No    Emotionally Abused: No    Physically Abused: No    Sexually Abused: No     BP 120/80   Pulse 78   Ht 5\' 9"  (1.753 m)   Wt 191 lb 3.2 oz (86.7 kg)   SpO2 99%   BMI 28.24 kg/m   Physical Exam:  Well appearing NAD HEENT: Unremarkable Neck:  No JVD, no thyromegally Lymphatics:  No adenopathy Back:  No CVA tenderness Lungs:  Clear with no wheezes HEART:  Regular rate rhythm, no murmurs, no rubs, no clicks Abd:  soft, positive bowel sounds, no organomegally, no rebound, no guarding Ext:  2 plus pulses, no edema, no cyanosis, no clubbing Skin:  No rashes no  nodules Neuro:  CN II through XII intact, motor grossly intact  EKG - nsr with non-specific STT changes.  DEVICE  Normal device function.  See PaceArt for details.   Assess/Plan:  Pacemaker - interrogation of his St. Jude DDD PM is working normally. We will recheck in several months. He is not dependent. Tachybrady syndrome - he is going out of rhythm about 11% of the time. He is asymptomatic. CAD -  he  is s/p PCI with Dr. Cyndra Dress and doing well. No change in meds. Dyspnea - he will continue lasix 20 mg daily.  Preop eval - he is an acceptable risk for hip replacement. His eliquis can be restarted after surgery when his bleeding risk is acceptable.    Pete Brand Ireoluwa Gorsline,MD

## 2023-07-22 NOTE — Telephone Encounter (Signed)
 Prescribe tramadol to take up to the day of surgery

## 2023-07-22 NOTE — Telephone Encounter (Signed)
 Patient called. He would like something for pain called in for him.

## 2023-07-26 NOTE — Progress Notes (Signed)
 Patient informed of new surgical arrival time. PAtient verbalized understanding of time change. Patient to arrive at 0900 pn 07/27/2023.

## 2023-07-27 ENCOUNTER — Other Ambulatory Visit: Payer: Self-pay

## 2023-07-27 ENCOUNTER — Ambulatory Visit (HOSPITAL_BASED_OUTPATIENT_CLINIC_OR_DEPARTMENT_OTHER): Admitting: Anesthesiology

## 2023-07-27 ENCOUNTER — Ambulatory Visit (HOSPITAL_COMMUNITY)

## 2023-07-27 ENCOUNTER — Ambulatory Visit (HOSPITAL_COMMUNITY): Payer: Self-pay | Admitting: Vascular Surgery

## 2023-07-27 ENCOUNTER — Encounter (HOSPITAL_COMMUNITY)
Admission: RE | Disposition: A | Discharge status: Home / Self Care | Payer: Self-pay | Source: Home / Self Care | Attending: Orthopedic Surgery

## 2023-07-27 ENCOUNTER — Encounter (HOSPITAL_COMMUNITY): Payer: Self-pay | Admitting: Orthopedic Surgery

## 2023-07-27 ENCOUNTER — Observation Stay (HOSPITAL_COMMUNITY)
Admission: RE | Admit: 2023-07-27 | Discharge: 2023-07-29 | Disposition: A | Discharge status: Home / Self Care | Attending: Orthopedic Surgery | Admitting: Orthopedic Surgery

## 2023-07-27 ENCOUNTER — Observation Stay (HOSPITAL_COMMUNITY)

## 2023-07-27 DIAGNOSIS — I252 Old myocardial infarction: Secondary | ICD-10-CM

## 2023-07-27 DIAGNOSIS — Z96642 Presence of left artificial hip joint: Secondary | ICD-10-CM

## 2023-07-27 DIAGNOSIS — M1611 Unilateral primary osteoarthritis, right hip: Secondary | ICD-10-CM | POA: Diagnosis present

## 2023-07-27 DIAGNOSIS — Z01818 Encounter for other preprocedural examination: Principal | ICD-10-CM

## 2023-07-27 DIAGNOSIS — I10 Essential (primary) hypertension: Secondary | ICD-10-CM | POA: Diagnosis not present

## 2023-07-27 DIAGNOSIS — Z955 Presence of coronary angioplasty implant and graft: Secondary | ICD-10-CM | POA: Diagnosis not present

## 2023-07-27 DIAGNOSIS — I2510 Atherosclerotic heart disease of native coronary artery without angina pectoris: Secondary | ICD-10-CM | POA: Insufficient documentation

## 2023-07-27 DIAGNOSIS — M1612 Unilateral primary osteoarthritis, left hip: Secondary | ICD-10-CM

## 2023-07-27 DIAGNOSIS — I480 Paroxysmal atrial fibrillation: Secondary | ICD-10-CM | POA: Diagnosis not present

## 2023-07-27 DIAGNOSIS — Z79899 Other long term (current) drug therapy: Secondary | ICD-10-CM | POA: Insufficient documentation

## 2023-07-27 HISTORY — PX: TOTAL HIP ARTHROPLASTY: SHX124

## 2023-07-27 LAB — ABO/RH: ABO/RH(D): A POS

## 2023-07-27 SURGERY — ARTHROPLASTY, HIP, TOTAL, ANTERIOR APPROACH
Anesthesia: Monitor Anesthesia Care | Site: Hip | Laterality: Left

## 2023-07-27 MED ORDER — APIXABAN 2.5 MG PO TABS
2.5000 mg | ORAL_TABLET | Freq: Two times a day (BID) | ORAL | Status: DC
  Administered 2023-07-28 – 2023-07-29 (×3): 2.5 mg via ORAL
  Filled 2023-07-27 (×3): qty 1

## 2023-07-27 MED ORDER — LOSARTAN POTASSIUM 50 MG PO TABS
50.0000 mg | ORAL_TABLET | Freq: Every day | ORAL | Status: DC
  Administered 2023-07-28 – 2023-07-29 (×2): 50 mg via ORAL
  Filled 2023-07-27 (×2): qty 1

## 2023-07-27 MED ORDER — METHOCARBAMOL 1000 MG/10ML IJ SOLN: 500.0000 mg | Freq: Four times a day (QID) | INTRAMUSCULAR | Status: DC | PRN

## 2023-07-27 MED ORDER — MORPHINE SULFATE 4 MG/ML IJ SOLN
INTRAMUSCULAR | Status: DC | PRN
  Administered 2023-07-27: 13 mL

## 2023-07-27 MED ORDER — SODIUM CHLORIDE 0.9 % IR SOLN
Status: DC | PRN
  Administered 2023-07-27: 3000 mL

## 2023-07-27 MED ORDER — DOCUSATE SODIUM 100 MG PO CAPS
100.0000 mg | ORAL_CAPSULE | Freq: Two times a day (BID) | ORAL | Status: DC
  Administered 2023-07-28 – 2023-07-29 (×3): 100 mg via ORAL
  Filled 2023-07-27 (×4): qty 1

## 2023-07-27 MED ORDER — ALBUMIN HUMAN 5 % IV SOLN
12.5000 g | Freq: Once | INTRAVENOUS | Status: AC
  Administered 2023-07-27: 12.5 g via INTRAVENOUS

## 2023-07-27 MED ORDER — FENTANYL CITRATE (PF) 100 MCG/2ML IJ SOLN
INTRAMUSCULAR | Status: DC | PRN
  Administered 2023-07-27 (×2): 50 ug via INTRAVENOUS

## 2023-07-27 MED ORDER — SODIUM CHLORIDE (PF) 0.9 % IJ SOLN
INTRAMUSCULAR | Status: DC | PRN
  Administered 2023-07-27: 60 mL

## 2023-07-27 MED ORDER — AMISULPRIDE (ANTIEMETIC) 5 MG/2ML IV SOLN: 10.0000 mg | Freq: Once | INTRAVENOUS | Status: DC | PRN

## 2023-07-27 MED ORDER — ACETAMINOPHEN 500 MG PO TABS
1000.0000 mg | ORAL_TABLET | Freq: Four times a day (QID) | ORAL | Status: AC
  Administered 2023-07-27 – 2023-07-28 (×3): 1000 mg via ORAL
  Filled 2023-07-27 (×4): qty 2

## 2023-07-27 MED ORDER — NITROGLYCERIN 0.4 MG SL SUBL: 0.4000 mg | SUBLINGUAL_TABLET | SUBLINGUAL | Status: DC | PRN

## 2023-07-27 MED ORDER — CLONIDINE HCL (ANALGESIA) 100 MCG/ML EP SOLN
EPIDURAL | Status: AC
  Filled 2023-07-27: qty 10

## 2023-07-27 MED ORDER — CEFAZOLIN SODIUM-DEXTROSE 2-4 GM/100ML-% IV SOLN
2.0000 g | INTRAVENOUS | Status: AC
  Administered 2023-07-27: 2 g via INTRAVENOUS
  Filled 2023-07-27: qty 100

## 2023-07-27 MED ORDER — METOPROLOL SUCCINATE 12.5 MG HALF TABLET
12.5000 mg | ORAL_TABLET | Freq: Every day | ORAL | Status: DC
  Administered 2023-07-28 – 2023-07-29 (×2): 12.5 mg via ORAL
  Filled 2023-07-27 (×2): qty 1

## 2023-07-27 MED ORDER — ALBUMIN HUMAN 5 % IV SOLN: INTRAVENOUS | Status: DC | PRN

## 2023-07-27 MED ORDER — ACETAMINOPHEN 500 MG PO TABS: 1000.0000 mg | ORAL_TABLET | Freq: Once | ORAL | Status: AC

## 2023-07-27 MED ORDER — ONDANSETRON HCL 4 MG/2ML IJ SOLN: 4.0000 mg | Freq: Once | INTRAMUSCULAR | Status: DC | PRN

## 2023-07-27 MED ORDER — CHLORHEXIDINE GLUCONATE 0.12 % MT SOLN: 15.0000 mL | Freq: Once | OROMUCOSAL | Status: AC

## 2023-07-27 MED ORDER — 0.9 % SODIUM CHLORIDE (POUR BTL) OPTIME
TOPICAL | Status: DC | PRN
  Administered 2023-07-27: 1000 mL

## 2023-07-27 MED ORDER — BUPIVACAINE LIPOSOME 1.3 % IJ SUSP
INTRAMUSCULAR | Status: AC
  Filled 2023-07-27: qty 20

## 2023-07-27 MED ORDER — HYDROMORPHONE HCL 1 MG/ML IJ SOLN
INTRAMUSCULAR | Status: AC
  Filled 2023-07-27: qty 1

## 2023-07-27 MED ORDER — VANCOMYCIN HCL 1000 MG IV SOLR
INTRAVENOUS | Status: DC | PRN
  Administered 2023-07-27: 1000 mg via TOPICAL

## 2023-07-27 MED ORDER — VANCOMYCIN HCL 1000 MG IV SOLR
INTRAVENOUS | Status: AC
  Filled 2023-07-27: qty 20

## 2023-07-27 MED ORDER — LACTATED RINGERS IV SOLN: INTRAVENOUS | Status: DC | PRN

## 2023-07-27 MED ORDER — CEFAZOLIN SODIUM-DEXTROSE 2-4 GM/100ML-% IV SOLN
2.0000 g | Freq: Three times a day (TID) | INTRAVENOUS | Status: AC
  Administered 2023-07-27 – 2023-07-28 (×2): 2 g via INTRAVENOUS
  Filled 2023-07-27 (×2): qty 100

## 2023-07-27 MED ORDER — METOCLOPRAMIDE HCL 5 MG/ML IJ SOLN: 5.0000 mg | Freq: Three times a day (TID) | INTRAMUSCULAR | Status: DC | PRN

## 2023-07-27 MED ORDER — CHLORHEXIDINE GLUCONATE 0.12 % MT SOLN
OROMUCOSAL | Status: AC
  Administered 2023-07-27: 15 mL via OROMUCOSAL
  Filled 2023-07-27: qty 15

## 2023-07-27 MED ORDER — ONDANSETRON HCL 4 MG PO TABS: 4.0000 mg | ORAL_TABLET | Freq: Four times a day (QID) | ORAL | Status: DC | PRN

## 2023-07-27 MED ORDER — PROPOFOL 10 MG/ML IV BOLUS
INTRAVENOUS | Status: DC | PRN
  Administered 2023-07-27: 20 mg via INTRAVENOUS

## 2023-07-27 MED ORDER — ALBUMIN HUMAN 5 % IV SOLN
INTRAVENOUS | Status: AC
  Filled 2023-07-27: qty 250

## 2023-07-27 MED ORDER — ACETAMINOPHEN 500 MG PO TABS
ORAL_TABLET | ORAL | Status: AC
Start: 2023-07-27 — End: 2023-07-27
  Administered 2023-07-27: 1000 mg via ORAL
  Filled 2023-07-27: qty 2

## 2023-07-27 MED ORDER — OXYCODONE HCL 5 MG PO TABS: 5.0000 mg | ORAL_TABLET | Freq: Once | ORAL | Status: DC | PRN

## 2023-07-27 MED ORDER — BUPIVACAINE HCL (PF) 0.25 % IJ SOLN
INTRAMUSCULAR | Status: AC
  Filled 2023-07-27: qty 30

## 2023-07-27 MED ORDER — PHENYLEPHRINE HCL-NACL 20-0.9 MG/250ML-% IV SOLN
INTRAVENOUS | Status: DC | PRN
Start: 2023-07-27 — End: 2023-07-27
  Administered 2023-07-27: 40 ug/min via INTRAVENOUS

## 2023-07-27 MED ORDER — OXYCODONE HCL 5 MG/5ML PO SOLN: 5.0000 mg | Freq: Once | ORAL | Status: DC | PRN

## 2023-07-27 MED ORDER — POVIDONE-IODINE 7.5 % EX SOLN: Freq: Once | CUTANEOUS | Status: DC

## 2023-07-27 MED ORDER — DOFETILIDE 125 MCG PO CAPS
125.0000 ug | ORAL_CAPSULE | Freq: Two times a day (BID) | ORAL | Status: DC
  Administered 2023-07-27 – 2023-07-29 (×4): 125 ug via ORAL
  Filled 2023-07-27 (×4): qty 1

## 2023-07-27 MED ORDER — POVIDONE-IODINE 10 % EX SWAB
2.0000 | Freq: Once | CUTANEOUS | Status: AC
  Administered 2023-07-27: 2 via TOPICAL

## 2023-07-27 MED ORDER — HYDROMORPHONE HCL 1 MG/ML IJ SOLN: 0.5000 mg | INTRAMUSCULAR | Status: DC | PRN

## 2023-07-27 MED ORDER — METOCLOPRAMIDE HCL 5 MG PO TABS: 5.0000 mg | ORAL_TABLET | Freq: Three times a day (TID) | ORAL | Status: DC | PRN

## 2023-07-27 MED ORDER — ACETAMINOPHEN 325 MG PO TABS: 325.0000 mg | ORAL_TABLET | Freq: Four times a day (QID) | ORAL | Status: DC | PRN

## 2023-07-27 MED ORDER — SODIUM CHLORIDE (PF) 0.9 % IJ SOLN
INTRAMUSCULAR | Status: AC
  Filled 2023-07-27: qty 20

## 2023-07-27 MED ORDER — LACTATED RINGERS IV SOLN: INTRAVENOUS | Status: DC

## 2023-07-27 MED ORDER — MENTHOL 3 MG MT LOZG: 1.0000 | LOZENGE | OROMUCOSAL | Status: DC | PRN

## 2023-07-27 MED ORDER — IRRISEPT - 450ML BOTTLE WITH 0.05% CHG IN STERILE WATER, USP 99.95% OPTIME
TOPICAL | Status: DC | PRN
  Administered 2023-07-27: 450 mL via TOPICAL

## 2023-07-27 MED ORDER — POTASSIUM CHLORIDE CRYS ER 20 MEQ PO TBCR
40.0000 meq | EXTENDED_RELEASE_TABLET | Freq: Every day | ORAL | Status: DC
  Administered 2023-07-27 – 2023-07-28 (×2): 40 meq via ORAL
  Filled 2023-07-27 (×2): qty 2

## 2023-07-27 MED ORDER — METHOCARBAMOL 500 MG PO TABS
500.0000 mg | ORAL_TABLET | Freq: Four times a day (QID) | ORAL | Status: DC | PRN
  Administered 2023-07-28 – 2023-07-29 (×2): 500 mg via ORAL
  Filled 2023-07-27 (×2): qty 1

## 2023-07-27 MED ORDER — OXYCODONE HCL 5 MG PO TABS
5.0000 mg | ORAL_TABLET | ORAL | Status: DC | PRN
  Administered 2023-07-28 – 2023-07-29 (×3): 10 mg via ORAL
  Filled 2023-07-27 (×3): qty 2

## 2023-07-27 MED ORDER — AMLODIPINE BESYLATE 10 MG PO TABS
10.0000 mg | ORAL_TABLET | Freq: Every day | ORAL | Status: DC
  Administered 2023-07-28 – 2023-07-29 (×2): 10 mg via ORAL
  Filled 2023-07-27 (×2): qty 1

## 2023-07-27 MED ORDER — HYDROMORPHONE HCL 1 MG/ML IJ SOLN
0.2500 mg | INTRAMUSCULAR | Status: DC | PRN
  Administered 2023-07-27: 0.25 mg via INTRAVENOUS

## 2023-07-27 MED ORDER — PHENOL 1.4 % MT LIQD: 1.0000 | OROMUCOSAL | Status: DC | PRN

## 2023-07-27 MED ORDER — ONDANSETRON HCL 4 MG/2ML IJ SOLN
INTRAMUSCULAR | Status: DC | PRN
  Administered 2023-07-27: 4 mg via INTRAVENOUS

## 2023-07-27 MED ORDER — MORPHINE SULFATE (PF) 4 MG/ML IV SOLN
INTRAVENOUS | Status: AC
  Filled 2023-07-27: qty 2

## 2023-07-27 MED ORDER — PROPOFOL 500 MG/50ML IV EMUL
INTRAVENOUS | Status: DC | PRN
  Administered 2023-07-27: 75 ug/kg/min via INTRAVENOUS

## 2023-07-27 MED ORDER — ORAL CARE MOUTH RINSE: 15.0000 mL | Freq: Once | OROMUCOSAL | Status: AC

## 2023-07-27 MED ORDER — FENTANYL CITRATE (PF) 250 MCG/5ML IJ SOLN
INTRAMUSCULAR | Status: AC
  Filled 2023-07-27: qty 5

## 2023-07-27 MED ORDER — PROPOFOL 10 MG/ML IV BOLUS
INTRAVENOUS | Status: AC
  Filled 2023-07-27: qty 20

## 2023-07-27 SURGICAL SUPPLY — 45 items
BAG COUNTER SPONGE SURGICOUNT (BAG) ×1 IMPLANT
BAG DECANTER FOR FLEXI CONT (MISCELLANEOUS) ×1 IMPLANT
BLADE CLIPPER SURG (BLADE) ×1 IMPLANT
BLADE SAW SGTL 18X1.27X75 (BLADE) ×1 IMPLANT
COOLER ICEMAN CLASSIC (MISCELLANEOUS) ×1 IMPLANT
COVER PERINEAL POST (MISCELLANEOUS) ×1 IMPLANT
COVER SURGICAL LIGHT HANDLE (MISCELLANEOUS) ×1 IMPLANT
CUP ACETAB W/GRIPTION 54 (Plate) IMPLANT
DRAPE C-ARM 42X72 X-RAY (DRAPES) ×1 IMPLANT
DRAPE STERI IOBAN 125X83 (DRAPES) ×1 IMPLANT
DRAPE U-SHAPE 47X51 STRL (DRAPES) ×3 IMPLANT
DRSG AQUACEL AG ADV 3.5X10 (GAUZE/BANDAGES/DRESSINGS) ×1 IMPLANT
DURAPREP 26ML APPLICATOR (WOUND CARE) ×1 IMPLANT
ELECTRODE BLDE 4.0 EZ CLN MEGD (MISCELLANEOUS) ×1 IMPLANT
ELECTRODE REM PT RTRN 9FT ADLT (ELECTROSURGICAL) ×1 IMPLANT
GLOVE BIOGEL PI IND STRL 8 (GLOVE) ×1 IMPLANT
GLOVE ECLIPSE 8.0 STRL XLNG CF (GLOVE) ×2 IMPLANT
GOWN STRL REUS W/ TWL LRG LVL3 (GOWN DISPOSABLE) ×3 IMPLANT
HEAD M SROM 36MM PLUS 1.5 (Hips) IMPLANT
HOOD PEEL AWAY T7 (MISCELLANEOUS) ×3 IMPLANT
KIT BASIN OR (CUSTOM PROCEDURE TRAY) ×1 IMPLANT
KIT TURNOVER KIT B (KITS) ×1 IMPLANT
LAVAGE JET IRRISEPT WOUND (IRRIGATION / IRRIGATOR) ×1 IMPLANT
LINER NEUTRAL 54X36MM PLUS 4 (Hips) IMPLANT
NDL HYPO 22X1.5 SAFETY MO (MISCELLANEOUS) ×1 IMPLANT
NEEDLE HYPO 22X1.5 SAFETY MO (MISCELLANEOUS) ×1 IMPLANT
NS IRRIG 1000ML POUR BTL (IV SOLUTION) ×1 IMPLANT
PACK TOTAL JOINT (CUSTOM PROCEDURE TRAY) ×1 IMPLANT
PAD ARMBOARD POSITIONER FOAM (MISCELLANEOUS) ×1 IMPLANT
PAD COLD SHLDR WRAP-ON (PAD) ×1 IMPLANT
SCREW 6.5MMX25MM (Screw) IMPLANT
SET HNDPC FAN SPRY TIP SCT (DISPOSABLE) ×1 IMPLANT
STEM FEM ACTIS HIGH SZ7 (Stem) IMPLANT
STRIP CLOSURE SKIN 1/2X4 (GAUZE/BANDAGES/DRESSINGS) ×1 IMPLANT
SUT ETHIBOND NAB CT1 #1 30IN (SUTURE) ×2 IMPLANT
SUT ETHILON 3 0 PS 1 (SUTURE) IMPLANT
SUT MNCRL AB 3-0 PS2 18 (SUTURE) ×1 IMPLANT
SUT VIC AB 0 CT1 27XBRD ANBCTR (SUTURE) ×3 IMPLANT
SUT VIC AB 1 CT1 27XBRD ANBCTR (SUTURE) ×3 IMPLANT
SUT VIC AB 2-0 CT2 27 (SUTURE) ×2 IMPLANT
SYR 30ML LL (SYRINGE) ×1 IMPLANT
TOWEL GREEN STERILE (TOWEL DISPOSABLE) ×1 IMPLANT
TOWEL GREEN STERILE FF (TOWEL DISPOSABLE) ×1 IMPLANT
TRAY FOLEY MTR SLVR 16FR STAT (SET/KITS/TRAYS/PACK) ×1 IMPLANT
WATER STERILE IRR 1000ML POUR (IV SOLUTION) ×1 IMPLANT

## 2023-07-27 NOTE — H&P (Signed)
 TOTAL HIP ADMISSION H&P  Patient is admitted for left total hip arthroplasty.  Subjective:  Chief Complaint: left hip pain  HPI: Calvin Cochran, 83 y.o. male, has a history of pain and functional disability in the left hip(s) due to arthritis and patient has failed non-surgical conservative treatments for greater than 12 weeks to include flexibility and strengthening excercises, use of assistive devices, and activity modification.  Onset of symptoms was gradual starting >10 years ago with gradually worsening course since that time.The patient noted no past surgery on the left hip(s).  Patient currently rates pain in the left hip at 9 out of 10 with activity. Patient has night pain, worsening of pain with activity and weight bearing, trendelenberg gait, pain that interfers with activities of daily living, pain with passive range of motion, crepitus, and joint swelling. Patient has evidence of subchondral sclerosis and joint space narrowing by imaging studies. This condition presents safety issues increasing the risk of falls. This patient has had  much pain in hip and back and wants hip fixed first .  There is no current active infection.  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 Carolynne Citron    Past Surgical History:  Procedure Laterality Date   COLONOSCOPY     CORONARY STENT INTERVENTION N/A 02/01/2017   Procedure: CORONARY STENT INTERVENTION;  Surgeon: Avanell Leigh, MD;  Location: [facility] INV[facility]IVE [facility];  Service: Cardiovascular;  Laterality: N/A;  LAD Prox-Mid lesion stented   LEFT HEART CATH AND CORONARY ANGIOGRAPHY N/A 02/01/2017   Procedure: LEFT HEART CATH AND CORONARY ANGIOGRAPHY;  Surgeon: Avanell Leigh, MD;  Location: [facility] CV [facility];  Service: Cardiovascular;  Laterality: N/A;   PACEMAKER IMPLANT N/A 09/09/2020   Procedure: PACEMAKER IMPLANT;  Surgeon: Tammie Fall, MD;  Location: [facility];  Service: Cardiovascular;  Laterality: N/A;    Current Facility-Administered Medications  Medication Dose Route Frequency Provider Last Rate Last Admin   ceFAZolin  (ANCEF ) IVPB 2g/100 mL premix  2 g Intravenous On Call to OR Magnant, Justice Olp, PA-C       lactated ringers  infusion   Intravenous Continuous Treen, Jon R, MD 10 mL/hr at 07/27/23 0920 New Bag at 07/27/23 0920   povidone-iodine  (BETADINE ) 7.5 % scrub   Topical Once Magnant, Charles L, PA-C       Allergies  Allergen Reactions   Amoxicillin Itching    Social History   Tobacco Use   Smoking status: Never   Smokeless tobacco: Never   Tobacco comments:    Never smoke 06/11/22  Substance Use Topics   Alcohol use: Yes    Alcohol/week: 1.0 standard drink of alcohol    Types: 1 Shots of liquor per week    Comment: 1  drink every couple of weeks    Family History  Problem Relation Age of Onset   Lung cancer Father      Review of Systems  Musculoskeletal:  Positive for arthralgias.  All other systems reviewed and are negative.   Objective:  Physical Exam Vitals reviewed.  HENT:     Head: Normocephalic.     Nose: Nose normal.     Mouth/Throat:     Mouth: Mucous membranes are moist.  Eyes:     Pupils: Pupils are equal, round, and  reactive to light.  Cardiovascular:     Rate and Rhythm: Normal rate.     Pulses: Normal pulses.  Pulmonary:     Effort: Pulmonary effort is normal.  Abdominal:     General: Abdomen is flat.  Musculoskeletal:        General: Normal range of motion.     Cervical back: Normal range of motion.  Skin:    General: Skin is warm.     Capillary Refill: Capillary refill takes less than 2 seconds.  Neurological:     General: No focal deficit present.     Mental Status: He is alert.  Psychiatric:        Mood and Affect: Mood normal.   Ortho exam demonstrates Trendelenburg gait to the left. Does have groin pain with internal/external rotation of the left hip but not the right. Pedal pulses are palpable. Ankle dorsiflexion plantarflexion quad hamstring strength intact. Does have a little bit of weakness with hip flexion on the left at 4-5 compared to the right. Does have some mild pain with forward lateral bending. No atrophy asymmetrically in either leg   Vital signs in last 24 hours: Pulse Rate:  [78] 78 (04/22 0850) Resp:  [20] 20 (04/22 0850) BP: (136)/(81) 136/81 (04/22 0850) SpO2:  [96 %] 96 % (04/22 0850) Weight:  [86.7 kg] 86.7 kg (04/22 0850)  Labs:   Estimated body mass index is 28.23 kg/m as calculated from the following:   Height as of this encounter: 5\' 9"  (1.753 m).   Weight as of this encounter: 86.7 kg.   Imaging Review Plain radiographs demonstrate severe degenerative joint disease of the left hip(s). The bone quality appears to be good for age and reported activity level.      Assessment/Plan:  End stage arthritis, left hip(s)  The patient history, physical examination, clinical judgement of the provider and imaging studies are consistent with end stage degenerative joint disease of the left hip(s) and total hip arthroplasty is deemed medically necessary. The treatment options including medical management, injection therapy, arthroscopy and arthroplasty were  discussed at length. The risks and benefits of total hip arthroplasty were presented and reviewed. The risks due to aseptic loosening, infection, stiffness, dislocation/subluxation,  thromboembolic complications and other imponderables were discussed.  The patient acknowledged the explanation, agreed to proceed with the plan and consent was signed. Patient is being admitted for inpatient treatment for surgery, pain control, PT, OT, prophylactic antibiotics, VTE prophylaxis, progressive ambulation and ADL's and discharge planning.The patient is planning to be discharged home with home health services    Patient's anticipated LOS is less than 2 midnights, meeting these requirements: - Younger than 34 - Lives within 1 hour of care - Has a competent adult at home to recover with post-op recover - NO history of  - Chronic pain requiring opiods  - Diabetes  - Coronary Artery Disease  - Heart failure  - Heart attack  - Stroke  -  DVT/VTE  - Cardiac arrhythmia  - Respiratory Failure/COPD  - Renal failure  - Anemia  - Advanced Liver disease

## 2023-07-27 NOTE — Anesthesia Postprocedure Evaluation (Signed)
 Anesthesia Post Note  Patient: Bronsen Serano  Procedure(s) Performed: LEFT TOTAL HIP ARTHROPLASTY (Left: Hip)     Patient location during evaluation: PACU Anesthesia Type: MAC and Spinal Level of consciousness: awake and alert and oriented Pain management: pain level controlled Vital Signs Assessment: post-procedure vital signs reviewed and stable Respiratory status: spontaneous breathing, nonlabored ventilation and respiratory function stable Cardiovascular status: blood pressure returned to baseline and stable Postop Assessment: no headache, no backache and spinal receding Anesthetic complications: no   No notable events documented.  Last Vitals:  Vitals:   07/27/23 0850 07/27/23 1745  BP: 136/81 111/62  Pulse: 78 71  Resp: 20 14  Temp:  36.9 C  SpO2: 96% 98%    Last Pain:  Vitals:   07/27/23 1745  PainSc: Asleep                 Jacquelyne Matte

## 2023-07-27 NOTE — Brief Op Note (Signed)
   07/27/2023  5:18 PM  PATIENT:  Hardy Lia  83 y.o. male  PRE-OPERATIVE DIAGNOSIS:  left hip osteoarthritis  POST-OPERATIVE DIAGNOSIS:  left hip osteoarthritis  PROCEDURE:  Procedure(s): LEFT TOTAL HIP ARTHROPLASTY  SURGEON:  Surgeon(s): Jasmine Mesi, MD  ASSISTANT: magnant pa  ANESTHESIA:   spinal  EBL: 200 ml    Total I/O In: 250 [IV Piggyback:250] Out: 200 [Blood:200]  BLOOD ADMINISTERED: none  DRAINS: none   LOCAL MEDICATIONS USED:  marcaine  morphine  clonidine  exparel   SPECIMEN:  No Specimen  COUNTS:  YES  TOURNIQUET:  * No tourniquets in log *  DICTATION: .Other Dictation: Dictation Number [id number]  PLAN OF CARE: Admit for overnight observation  PATIENT DISPOSITION:  PACU - hemodynamically stable

## 2023-07-27 NOTE — Anesthesia Procedure Notes (Signed)
 Spinal  Patient location during procedure: OR Start time: 07/27/2023 3:00 PM End time: 07/27/2023 3:06 PM Reason for block: surgical anesthesia Staffing Performed: anesthesiologist  Anesthesiologist: Jacquelyne Matte, DO Performed by: Jacquelyne Matte, DO Authorized by: Jacquelyne Matte, DO   Preanesthetic Checklist Completed: patient identified, IV checked, risks and benefits discussed, surgical consent, monitors and equipment checked, pre-op evaluation and timeout performed Spinal Block Patient position: sitting Prep: DuraPrep and site prepped and draped Patient monitoring: cardiac monitor, continuous pulse ox and blood pressure Approach: midline Location: L3-4 Injection technique: single-shot Needle Needle type: Pencan  Needle gauge: 24 G Needle length: 9 cm Assessment Sensory level: T6 Events: CSF return Additional Notes Functioning IV was confirmed and monitors were applied. Sterile prep and drape, including hand hygiene and sterile gloves were used. The patient was positioned and the spine was prepped. The skin was anesthetized with lidocaine .  Free flow of clear CSF was obtained prior to injecting local anesthetic into the CSF.  The spinal needle aspirated freely following injection.  The needle was carefully withdrawn.  The patient tolerated the procedure well.

## 2023-07-27 NOTE — Transfer of Care (Signed)
 Immediate Anesthesia Transfer of Care Note  Patient: Calvin Cochran  Procedure(s) Performed: LEFT TOTAL HIP ARTHROPLASTY (Left: Hip)  Patient Location: PACU  Anesthesia Type:MAC and Regional  Level of Consciousness: awake, drowsy, and patient cooperative  Airway & Oxygen Therapy: Patient Spontanous Breathing and Patient connected to face mask oxygen  Post-op Assessment: Report given to RN and Post -op Vital signs reviewed and stable  Post vital signs: Reviewed and stable  Last Vitals:  Vitals Value Taken Time  BP 111/62 07/27/23 1745  Temp 36.9 C 07/27/23 1745  Pulse 62 07/27/23 1749  Resp 13 07/27/23 1749  SpO2 98 % 07/27/23 1749  Vitals shown include unfiled device data.  Last Pain:  Vitals:   07/27/23 0903  PainSc: 4          Complications: No notable events documented.

## 2023-07-28 ENCOUNTER — Encounter (HOSPITAL_COMMUNITY): Payer: Self-pay | Admitting: Orthopedic Surgery

## 2023-07-28 DIAGNOSIS — M1611 Unilateral primary osteoarthritis, right hip: Secondary | ICD-10-CM | POA: Diagnosis not present

## 2023-07-28 LAB — BASIC METABOLIC PANEL WITH GFR
Anion gap: 11 (ref 5–15)
BUN: 24 mg/dL — ABNORMAL HIGH (ref 8–23)
CO2: 22 mmol/L (ref 22–32)
Calcium: 8.9 mg/dL (ref 8.9–10.3)
Chloride: 102 mmol/L (ref 98–111)
Creatinine, Ser: 1.18 mg/dL (ref 0.61–1.24)
GFR, Estimated: >60 mL/min (ref >60)
Glucose, Bld: 171 mg/dL — ABNORMAL HIGH (ref 70–99)
Potassium: 4.6 mmol/L (ref 3.5–5.1)
Sodium: 135 mmol/L (ref 135–145)

## 2023-07-28 LAB — MAGNESIUM: Magnesium: 1.6 mg/dL — ABNORMAL LOW (ref 1.7–2.4)

## 2023-07-28 MED ORDER — MAGNESIUM SULFATE 2 GM/50ML IV SOLN
2.0000 g | Freq: Once | INTRAVENOUS | Status: AC
  Administered 2023-07-28: 2 g via INTRAVENOUS
  Filled 2023-07-28: qty 50

## 2023-07-28 MED ORDER — FUROSEMIDE 20 MG PO TABS
20.0000 mg | ORAL_TABLET | Freq: Every day | ORAL | Status: DC
Start: 2023-07-28 — End: 2023-07-29
  Administered 2023-07-28 – 2023-07-29 (×2): 20 mg via ORAL
  Filled 2023-07-28 (×2): qty 1

## 2023-07-28 NOTE — Plan of Care (Signed)

## 2023-07-28 NOTE — Evaluation (Signed)
 Physical Therapy Evaluation Patient Details Name: Calvin Cochran MRN: [id number] DOB: 07-31-1940 Today's Date: 07/28/2023  History of Present Illness  Admitted for THA;  has a past medical history of CAD (coronary artery disease), Dyspnea, Essential hypertension, NSTEMI (non-ST elevated myocardial infarction) (HCC) (01/2017), Paroxysmal atrial fibrillation (HCC), and Tachycardia-bradycardia syndrome (HCC).  Clinical Impression   Pt is s/p THA resulting in the deficits listed below (see PT Problem List). Comes from home where he lives with his wife in a single level home with a level entry; Able to walking independently without assistive device (though shorter distances due ot L hip an dback pain)  at baseline; Today, pt was able to come to EOB, stand, and walk near household distances with contact guard assist; If he is very much wanting to get home today, it is not unreasonable; Another night inptp might be beneficial for getting pain management and bowel regimen dialed in; this decision can be made after second PT session today; I do anticipate good progress;  Pt will benefit from acute skilled PT to increase their independence and safety with mobility to facilitate discharge.          If plan is discharge home, recommend the following: A little help with walking and/or transfers;A little help with bathing/dressing/bathroom   Can travel by private vehicle        Equipment Recommendations Rolling walker (2 wheels);BSC/3in1  Recommendations for Other Services       Functional Status Assessment Patient has had a recent decline in their functional status and demonstrates the ability to make significant improvements in function in a reasonable and predictable amount of time.     Precautions / Restrictions Precautions Precautions: None Precaution/Restrictions Comments: fall risk is present, but minimal Restrictions Weight Bearing Restrictions Per Provider Order: No      Mobility  Bed  Mobility Overal bed mobility: Needs Assistance Bed Mobility: Supine to Sit     Supine to sit: Contact guard     General bed mobility comments: Cues for technqiue    Transfers Overall transfer level: Needs assistance Equipment used: Rolling walker (2 wheels) Transfers: Sit to/from Stand Sit to Stand: Contact guard assist           General transfer comment: Cues for hand placement and safety    Ambulation/Gait Ambulation/Gait assistance: Contact guard assist Gait Distance (Feet): 50 Feet Assistive device: Rolling walker (2 wheels) Gait Pattern/deviations: Step-to pattern       General Gait Details: Initial cues for sequence, and then progressing to smoother steps; notably decr step length and slower steps after about 30 feet, and pt with incr pallor; last 20 feet steady, but significantly slower; no dizziness reported when asked; pt attributes teh need to sit to his incr back pain  Stairs            Wheelchair Mobility     Tilt Bed    Modified Rankin (Stroke Patients Only)       Balance Overall balance assessment: Needs assistance   Sitting balance-Leahy Scale: Good       Standing balance-Leahy Scale: Fair                               Pertinent Vitals/Pain Pain Assessment Pain Assessment: 0-10 Pain Score: 5  Pain Location: L hip an dback Pain Descriptors / Indicators: Aching, Grimacing, Guarding Pain Intervention(s): Monitored during session, RN gave pain meds during session    Home Living  Family/patient expects to be discharged to:: Private residence Living Arrangements: Spouse/significant other Available Help at Discharge: Family;Available 24 hours/day Type of Home: House Home Access: Level entry (small threshold)       Home Layout: One level Home Equipment: Grab bars - toilet;Grab bars - tub/shower      Prior Function Prior Level of Function : Independent/Modified Independent             Mobility Comments: More  painful L hip an dback leading up to surgery; "I didn't use one (RW or cane), but I probably needed it"       Extremity/Trunk Assessment   Upper Extremity Assessment Upper Extremity Assessment: Overall WFL for tasks assessed    Lower Extremity Assessment Lower Extremity Assessment: LLE deficits/detail LLE Deficits / Details: Grossly decr AROM and strength postop    Cervical / Trunk Assessment Cervical / Trunk Assessment: Other exceptions Cervical / Trunk Exceptions: Longstanding back pain (ti was actually back pain that limited amb distance  Communication   Communication Communication: No apparent difficulties    Cognition Arousal: Alert Behavior During Therapy: WFL for tasks assessed/performed   PT - Cognitive impairments: No apparent impairments                         Following commands: Intact       Cueing Cueing Techniques: Verbal cues, Visual cues     General Comments General comments (skin integrity, edema, etc.): Discussed considerations for DC home today; at this point, leaning towards another night inpatient    Exercises     Assessment/Plan    PT Assessment Patient needs continued PT services  PT Problem List Decreased strength;Decreased range of motion;Decreased activity tolerance;Decreased balance;Decreased mobility;Decreased coordination;Decreased knowledge of use of DME;Decreased safety awareness;Decreased knowledge of precautions;Pain       PT Treatment Interventions DME instruction;Gait training;Stair training;Functional mobility training;Therapeutic activities;Therapeutic exercise;Balance training;Patient/family education;Manual techniques    PT Goals (Current goals can be found in the Care Plan section)  Acute Rehab PT Goals Patient Stated Goal: be able to walk wihtout pain PT Goal Formulation: With patient Time For Goal Achievement: 08/04/23 Potential to Achieve Goals: Good    Frequency 7X/week     Co-evaluation                AM-PAC PT "6 Clicks" Mobility  Outcome Measure Help needed turning from your back to your side while in a flat bed without using bedrails?: A Little Help needed moving from lying on your back to sitting on the side of a flat bed without using bedrails?: None Help needed moving to and from a bed to a chair (including a wheelchair)?: None Help needed standing up from a chair using your arms (e.g., wheelchair or bedside chair)?: A Little Help needed to walk in hospital room?: A Little Help needed climbing 3-5 steps with a railing? : A Lot 6 Click Score: 19    End of Session Equipment Utilized During Treatment: Gait belt Activity Tolerance: Patient tolerated treatment well Patient left: in chair;with call bell/phone within reach Nurse Communication: Mobility status PT Visit Diagnosis: Pain;Other abnormalities of gait and mobility (R26.89) Pain - Right/Left: Left Pain - part of body: Hip (and back)    Time: 1610-9604 PT Time Calculation (min) (ACUTE ONLY): 46 min   Charges:   PT Evaluation $PT Eval Low Complexity: 1 Low PT Treatments $Gait Training: 8-22 mins $Therapeutic Activity: 8-22 mins PT General Charges $$ ACUTE PT VISIT: 1 Visit  Darcus Eastern, PT  Acute Rehabilitation Services Office [phone] Secure Chat welcomed   Marcial Setting 07/28/2023, 10:49 AM

## 2023-07-28 NOTE — Care Management Obs Status (Signed)
 MEDICARE OBSERVATION STATUS NOTIFICATION   Patient Details  Name: Calvin Cochran MRN: [id number] Date of Birth: 1940/05/17   Medicare Observation Status Notification Given:  Yes    Elspeth Hals, LCSW 07/28/2023, 1:28 PM

## 2023-07-28 NOTE — Op Note (Signed)
 AME: JOHNNY, GORTER MEDICAL RECORD NO: [id number] ACCOUNT NO: [id number] DATE OF BIRTH: June 30, 1940 FACILITY: [facility] LOCATION: [facility] PHYSICIAN: Gloriann Larger. Rozelle Corning, MD  Operative Report   PREOPERATIVE DIAGNOSIS:  Left hip arthritis.  POSTOPERATIVE DIAGNOSIS:  Left hip arthritis.  PROCEDURE:  Left total hip replacement using DePuy components; 54 Pinnacle cup, 1 screw, +4 liner, high offset, size 7 stem with 36 mm, +1.5 mm stainless steel head.  SURGEON:  Gloriann Larger. Rozelle Corning, MD.  ASSISTANT:  Prentis Brock, PA.  INDICATIONS:  The patient is an 83 year old patient with end-stage left hip arthritis who presents for operative management after explanation of risks and benefits.  PROCEDURE IN DETAIL:  The patient was brought to the operating room where spinal anesthetic was induced. Preoperative antibiotics were administered.  A timeout was called.  The patient was placed on the Hana bed with all extremities padded.  The left hip  region was prescrubbed with alcohol and Betadine , allowed to air dry, prepped with DuraPrep solution and draped in a sterile manner.  Ioban was used to cover the operative field.  An incision was made 2 cm distal and posterior to the anterior superior  iliac crest.  Skin and subcutaneous tissue were sharply divided for a distance of about 10 cm.  The tensor fascia lata was visualized, and the fascia overlying the muscle was incised up to the anterior superior iliac crest.  Care was taken to avoid  injury to sensory nerves.  Then, the plane between the tensor fascia lata muscle and the rectus was developed.  Bleeding crossing vessels were coagulated. Cobra retractor was placed along the inferior femoral neck and then a second retractor placed along  the superior femoral neck.  Next, a T-capsulotomy was made on the capsule.  This was taken around with the leg in external rotation. Next, the head and neck were cut approximately one fingerbreadth from the lesser trochanter. Bone quality  was excellent.  Head was removed. At this time, the self-retaining retractor was placed to expose the acetabulum.  Pulvinar was removed.  Transverse acetabular ligament was incised in order to prevent cup impingement when seating the cup. Next, the acetabulum was  reamed in approximately 45 degrees of abduction and 15 degrees of anteversion.  This was done under fluoroscopy.  Reaming was performed up to 54, and a 54 cup was placed. Very good seating of the cup was achieved.  One screw was placed superiorly. Next,  a +4 liner was placed. Then, we turned our attention towards the femur. The leg was externally rotated to 130. Femoral lift was placed before external rotation, and then the leg was taken in adduction and extension. Mueller retractor was placed along  with a trochanteric retractor.  The box cutter was then used to obtain lateralization.  Next, the femur was broached up to a size 7 parallel to the patient's native version. Very good bone quality was achieved.  Calcar planning was performed.  With the 7  in place, we did a trial reduction with the +1.5 ball on a high offset neck.  This gave excellent stability with external rotation to 70 degrees and extension to 70 degrees.  Very good leg lengths were achieved under fluoroscopy.  Trial components were  removed on the femoral side.  Thorough irrigation was performed on the femoral canal.  IrriSept solution was utilized after the arthrotomy at multiple times during the case.  Vancomycin  powder was placed within the femoral canal.  The stem was placed  with very good  press fit obtained.  Next, the +1.5 36 head was placed, and the same stability parameters were maintained.  The incision was then thoroughly irrigated.  Final picture copies were made.  The capsule was then closed using #1 Vicryl suture.   We did anesthetize the muscle and the capsule with Exparel , Marcaine , and saline.  Next, thorough irrigation was performed. The tensor fascia lata was  then closed using #1 Vicryl suture followed by interrupted inverted 0 Vicryl suture, 2-0 Vicryl suture,  and 3-0 Monocryl with Steri-Strips, Aquacel dressing applied.  Luke's assistance was required at all times for retraction, opening and closing, mobilization of tissue.  His assistance was of medical necessity.   NIK D: 07/27/2023 5:18:25 pm T: 07/28/2023 1:02:00 am  JOB: [id number]/ [id number]

## 2023-07-28 NOTE — Progress Notes (Signed)
  Subjective: Calvin Cochran is a 83 y.o. male s/p left THA.  They are POD 1.  Pt's pain is moderate but controlled.  Pt has ambulated with some difficulty.  Was able to walk 50 feet with physical therapy this morning.  He denies any chest pain or shortness of breath.  Eliquis  is being resumed this morning.  No dizziness or lightheadedness.  He has no stairs to get into his house.  Lives with his wife.   Objective: Vital signs in last 24 hours: Temp:  [97.5 F (36.4 C)-98.6 F (37 C)] 98.6 F (37 C) (04/23 0751) Pulse Rate:  [59-73] 73 (04/23 0751) Resp:  [12-17] 17 (04/23 0751) BP: (85-112)/(60-77) 112/77 (04/23 0751) SpO2:  [92 %-99 %] 98 % (04/23 0751)  Intake/Output from previous day: 04/22 0701 - 04/23 0700 In: 1250 [I.V.:1000; IV Piggyback:250] Out: 1100 [Urine:900; Blood:200] Intake/Output this shift: No intake/output data recorded.  Exam:  No gross blood or drainage overlying the dressing 2+ DP pulse Sensation intact distally in the left foot Able to dorsiflex and plantarflex the left ankle Leg lengths are equal No calf tenderness.  Negative Homans' sign. Labs: No results for input(s): "HGB" in the last 72 hours. No results for input(s): "WBC", "RBC", "HCT", "PLT" in the last 72 hours. Recent Labs    07/28/23 0846  NA 135  K 4.6  CL 102  CO2 22  BUN 24*  CREATININE 1.18  GLUCOSE 171*  CALCIUM  8.9   No results for input(s): "LABPT", "INR" in the last 72 hours.  Assessment/Plan: Pt is POD 1 s/p left THA.    -Plan to discharge to home today or tomorrow pending patient's pain and PT eval  -WBAT with a walker    [employer] 07/28/2023, 11:20 AM

## 2023-07-28 NOTE — Progress Notes (Signed)
 Physical Therapy Treatment Patient Details Name: Calvin Cochran MRN: [id number] DOB: Feb 26, 1941 Today's Date: 07/28/2023   History of Present Illness Admitted for THA;  has a past medical history of CAD (coronary artery disease), Dyspnea, Essential hypertension, NSTEMI (non-ST elevated myocardial infarction) (HCC) (01/2017), Paroxysmal atrial fibrillation (HCC), and Tachycardia-bradycardia syndrome (HCC).    PT Comments  Pt tolerates treatment well, ambulating for increased distances this session. Pt reports improved comfort with activity. PT notes reduced foot clearance with LLE during ambulation, provides education on performance of heel slides at bed level to improve hip flexor activation. Pt is mobilizing well, PT will continue to follow until discharge is complete or all goals are met.     If plan is discharge home, recommend the following: A little help with walking and/or transfers;A little help with bathing/dressing/bathroom   Can travel by private vehicle        Equipment Recommendations  Rolling walker (2 wheels);BSC/3in1    Recommendations for Other Services       Precautions / Restrictions Precautions Precautions: None Recall of Precautions/Restrictions: Intact Precaution/Restrictions Comments: fall risk is present, but minimal Restrictions Weight Bearing Restrictions Per Provider Order: Yes LLE Weight Bearing Per Provider Order: Weight bearing as tolerated     Mobility  Bed Mobility Overal bed mobility: Needs Assistance Bed Mobility: Sit to Supine       Sit to supine: Min assist   General bed mobility comments: assist for LLE when returning to supine    Transfers Overall transfer level: Needs assistance Equipment used: Rolling walker (2 wheels) Transfers: Sit to/from Stand Sit to Stand: Contact guard assist                Ambulation/Gait Ambulation/Gait assistance: Supervision Gait Distance (Feet): 120 Feet Assistive device: Rolling walker (2  wheels) Gait Pattern/deviations: Step-through pattern Gait velocity: reduced Gait velocity interpretation: <1.8 ft/sec, indicate of risk for recurrent falls   General Gait Details: slowed step-through gait, pt with good retention of gait sequencing cues from PT session this morning   Stairs             Wheelchair Mobility     Tilt Bed    Modified Rankin (Stroke Patients Only)       Balance Overall balance assessment: Needs assistance Sitting-balance support: Feet supported, No upper extremity supported Sitting balance-Leahy Scale: Good     Standing balance support: Single extremity supported, Reliant on assistive device for balance Standing balance-Leahy Scale: Poor                              Communication Communication Communication: No apparent difficulties  Cognition Arousal: Alert Behavior During Therapy: WFL for tasks assessed/performed   PT - Cognitive impairments: No apparent impairments                         Following commands: Intact      Cueing Cueing Techniques: Verbal cues  Exercises General Exercises - Lower Extremity Heel Slides:  (PT encourages heel slides to improve activation of hip flexors)    General Comments General comments (skin integrity, edema, etc.): VSS on RA      Pertinent Vitals/Pain Pain Assessment Pain Assessment: 0-10 Pain Score: 3  Pain Location: L hip Pain Descriptors / Indicators: Sore Pain Intervention(s): Monitored during session    Home Living  Prior Function            PT Goals (current goals can now be found in the care plan section) Acute Rehab PT Goals Patient Stated Goal: be able to walk wihtout pain Progress towards PT goals: Progressing toward goals    Frequency    7X/week      PT Plan      Co-evaluation              AM-PAC PT "6 Clicks" Mobility   Outcome Measure  Help needed turning from your back to your side while  in a flat bed without using bedrails?: None Help needed moving from lying on your back to sitting on the side of a flat bed without using bedrails?: A Little Help needed moving to and from a bed to a chair (including a wheelchair)?: A Little Help needed standing up from a chair using your arms (e.g., wheelchair or bedside chair)?: A Little Help needed to walk in hospital room?: A Little Help needed climbing 3-5 steps with a railing? : A Little 6 Click Score: 19    End of Session Equipment Utilized During Treatment: Gait belt Activity Tolerance: Patient tolerated treatment well Patient left: in bed;with call bell/phone within reach Nurse Communication: Mobility status PT Visit Diagnosis: Pain;Other abnormalities of gait and mobility (R26.89) Pain - Right/Left: Left Pain - part of body: Hip     Time: 1537-1600 PT Time Calculation (min) (ACUTE ONLY): 23 min  Charges:    $Gait Training: 8-22 mins $Therapeutic Activity: 8-22 mins PT General Charges $$ ACUTE PT VISIT: 1 Visit                     Rexie Catena, PT, DPT Acute Rehabilitation Office [phone]    Rexie Catena 07/28/2023, 4:49 PM

## 2023-07-29 DIAGNOSIS — M1611 Unilateral primary osteoarthritis, right hip: Secondary | ICD-10-CM | POA: Diagnosis not present

## 2023-07-29 LAB — BASIC METABOLIC PANEL WITH GFR
Anion gap: 8 (ref 5–15)
BUN: 27 mg/dL — ABNORMAL HIGH (ref 8–23)
CO2: 24 mmol/L (ref 22–32)
Calcium: 8.5 mg/dL — ABNORMAL LOW (ref 8.9–10.3)
Chloride: 104 mmol/L (ref 98–111)
Creatinine, Ser: 1.19 mg/dL (ref 0.61–1.24)
GFR, Estimated: >60 mL/min (ref >60)
Glucose, Bld: 120 mg/dL — ABNORMAL HIGH (ref 70–99)
Potassium: 4.5 mmol/L (ref 3.5–5.1)
Sodium: 136 mmol/L (ref 135–145)

## 2023-07-29 LAB — MAGNESIUM: Magnesium: 2 mg/dL (ref 1.7–2.4)

## 2023-07-29 MED ORDER — DOCUSATE SODIUM 100 MG PO CAPS: 100.0000 mg | ORAL_CAPSULE | Freq: Two times a day (BID) | ORAL | 0 refills | Status: AC

## 2023-07-29 MED ORDER — METHOCARBAMOL 500 MG PO TABS: 500.0000 mg | ORAL_TABLET | Freq: Three times a day (TID) | ORAL | 0 refills | Status: DC | PRN

## 2023-07-29 MED ORDER — APIXABAN 5 MG PO TABS: 5.0000 mg | ORAL_TABLET | Freq: Two times a day (BID) | ORAL | 0 refills | Status: DC

## 2023-07-29 MED ORDER — APIXABAN 2.5 MG PO TABS: ORAL_TABLET | ORAL | 5 refills | Status: DC

## 2023-07-29 MED ORDER — OXYCODONE HCL 5 MG PO TABS: 5.0000 mg | ORAL_TABLET | ORAL | 0 refills | Status: DC | PRN

## 2023-07-29 MED ORDER — APIXABAN 5 MG PO TABS: 5.0000 mg | ORAL_TABLET | Freq: Two times a day (BID) | ORAL | Status: DC

## 2023-07-29 NOTE — Progress Notes (Signed)
 Physical Therapy Treatment Patient Details Name: Calvin Cochran MRN: [id number] DOB: 1940-11-14 Today's Date: 07/29/2023   History of Present Illness 83 yo male s/p L DA-THA on 4/23. PMH includes CAD (coronary artery disease), Dyspnea, Essential hypertension, NSTEMI (non-ST elevated myocardial infarction) (HCC) (01/2017), Paroxysmal atrial fibrillation (HCC), and Tachycardia-bradycardia syndrome (HCC).    PT Comments  Pt progressing well, ambulating hallway distance with use of RW and supervision for safety. Pt still demonstrating difficulty with hip flexion during exercises and during gait, but pt states it is improving and he is aware of it. PT reviewed and practiced THA exercises with pt, handout administered. Pt to d/c home today with support of his wife and daughter, all education completed, no further acute PT needs at this time.      If plan is discharge home, recommend the following: A little help with walking and/or transfers;A little help with bathing/dressing/bathroom   Can travel by private vehicle        Equipment Recommendations  Rolling walker (2 wheels);BSC/3in1    Recommendations for Other Services       Precautions / Restrictions Precautions Precautions: Fall Restrictions Weight Bearing Restrictions Per Provider Order: Yes LLE Weight Bearing Per Provider Order: Weight bearing as tolerated     Mobility  Bed Mobility Overal bed mobility: Modified Independent             General bed mobility comments: increased time, cues for use of RLE under LLE when moving sit>supine to assist in leg lift.    Transfers Overall transfer level: Needs assistance Equipment used: Rolling walker (2 wheels) Transfers: Sit to/from Stand Sit to Stand: Supervision           General transfer comment: cues for safe hand placement when rising/sitting    Ambulation/Gait Ambulation/Gait assistance: Supervision Gait Distance (Feet): 80 Feet Assistive device: Rolling walker (2  wheels) Gait Pattern/deviations: Step-through pattern, Decreased stride length, Antalgic Gait velocity: decr     General Gait Details: cues for upright posture, heel-toe gait on L to emphasize foot clearance   Stairs             Wheelchair Mobility     Tilt Bed    Modified Rankin (Stroke Patients Only)       Balance Overall balance assessment: Needs assistance Sitting-balance support: Feet supported, No upper extremity supported Sitting balance-Leahy Scale: Good     Standing balance support: Single extremity supported, Reliant on assistive device for balance Standing balance-Leahy Scale: Fair                              Hotel manager: No apparent difficulties  Cognition Arousal: Alert Behavior During Therapy: WFL for tasks assessed/performed   PT - Cognitive impairments: No apparent impairments                         Following commands: Intact      Cueing Cueing Techniques: Verbal cues  Exercises Total Joint Exercises Ankle Circles/Pumps: AROM, Both, 5 reps, Seated Quad Sets: AROM, Left, 5 reps, Supine Short Arc Quad: AROM, Left, 5 reps, Supine Heel Slides: AAROM, Left, 5 reps, Supine Hip ABduction/ADduction: AAROM, Left, 5 reps, Supine Long Arc Quad: AROM, Left, 5 reps, Supine    General Comments   Home walking program: up and walking 1x/hour during waking hours for short household distances with supervision of family, to promote circulation, activity tolerance, and strength maintenance.  Pertinent Vitals/Pain Pain Assessment Pain Assessment: 0-10 Pain Score: 6  Pain Location: L hip Pain Descriptors / Indicators: Sore, Grimacing Pain Intervention(s): Limited activity within patient's tolerance, Monitored during session, Repositioned    Home Living                          Prior Function            PT Goals (current goals can now be found in the care plan section) Acute  Rehab PT Goals Patient Stated Goal: be able to walk wihtout pain PT Goal Formulation: With patient Time For Goal Achievement: 08/04/23 Potential to Achieve Goals: Good Progress towards PT goals: Progressing toward goals    Frequency    7X/week      PT Plan      Co-evaluation              AM-PAC PT "6 Clicks" Mobility   Outcome Measure  Help needed turning from your back to your side while in a flat bed without using bedrails?: A Little Help needed moving from lying on your back to sitting on the side of a flat bed without using bedrails?: A Little Help needed moving to and from a bed to a chair (including a wheelchair)?: A Little Help needed standing up from a chair using your arms (e.g., wheelchair or bedside chair)?: A Little Help needed to walk in hospital room?: A Little Help needed climbing 3-5 steps with a railing? : A Little 6 Click Score: 18    End of Session   Activity Tolerance: Patient tolerated treatment well Patient left: in bed;with call bell/phone within reach;with bed alarm set Nurse Communication: Mobility status PT Visit Diagnosis: Pain;Other abnormalities of gait and mobility (R26.89) Pain - Right/Left: Left Pain - part of body: Hip     Time: 1610-9604 PT Time Calculation (min) (ACUTE ONLY): 20 min  Charges:    $Gait Training: 8-22 mins PT General Charges $$ ACUTE PT VISIT: 1 Visit                     Shirlene Doughty, PT DPT Acute Rehabilitation Services Secure Chat Preferred  Office [phone]    Jayra Choyce E Burnadette Carrion 07/29/2023, 9:11 AM

## 2023-07-29 NOTE — Progress Notes (Signed)
 Reviewed AVS, patient expressed understanding of medications, MD follow up reviewed.   Patient informed and expressed understanding of where to pick up prescriptions.  Removed IV, Site clean, dry and intact.  Patient states all belongings brought to the hospital at time of admission are accounted for and packed to take home.  Pt transported to Discharge lounge to wait for transportation home.

## 2023-07-29 NOTE — Progress Notes (Signed)
  Subjective: Patient stable.  Pain controlled.  Leg control is better.     Objective: Vital signs in last 24 hours: Temp:  [98.1 F (36.7 C)-98.6 F (37 C)] 98.6 F (37 C) (04/24 0531) Pulse Rate:  [72-77] 74 (04/24 0531) Resp:  [17-18] 17 (04/24 0531) BP: (92-112)/(64-77) 92/66 (04/24 0531) SpO2:  [96 %-99 %] 96 % (04/24 0531)  Intake/Output from previous day: No intake/output data recorded. Intake/Output this shift: No intake/output data recorded.  Exam:  Intact pulses distally Dorsiflexion/Plantar flexion intact  Labs: No results for input(s): "HGB" in the last 72 hours. No results for input(s): "WBC", "RBC", "HCT", "PLT" in the last 72 hours. Recent Labs    07/28/23 0846  NA 135  K 4.6  CL 102  CO2 22  BUN 24*  CREATININE 1.18  GLUCOSE 171*  CALCIUM  8.9   No results for input(s): "LABPT", "INR" in the last 72 hours.  Assessment/Plan: Plan at this time is discharge to home after physical therapy this morning.  We will place patient on Eliquis  5 mg in the morning 2 and half milligrams at night until he sees his cardiologist in follow-up.  Oxycodone  and muscle relaxers to be sent home as well.  He will follow-up with us  in 2 weeks.  Overall he feels good today.   G Scott Suleyma Wafer 07/29/2023, 7:18 AM

## 2023-08-01 DIAGNOSIS — M1612 Unilateral primary osteoarthritis, left hip: Secondary | ICD-10-CM

## 2023-08-03 ENCOUNTER — Other Ambulatory Visit: Payer: Self-pay | Admitting: Surgical

## 2023-08-03 ENCOUNTER — Telehealth: Payer: Self-pay | Admitting: Orthopedic Surgery

## 2023-08-03 MED ORDER — METHOCARBAMOL 500 MG PO TABS: 500.0000 mg | ORAL_TABLET | Freq: Three times a day (TID) | ORAL | 0 refills | Status: DC | PRN

## 2023-08-03 MED ORDER — OXYCODONE HCL 5 MG PO TABS: 5.0000 mg | ORAL_TABLET | Freq: Four times a day (QID) | ORAL | 0 refills | Status: DC | PRN

## 2023-08-03 NOTE — Telephone Encounter (Signed)
 Sent in

## 2023-08-03 NOTE — Telephone Encounter (Signed)
 Called and advised pt.

## 2023-08-03 NOTE — Telephone Encounter (Signed)
 Rx refill Oxycodone  and Methocarbamol      [facility] in [place], [place]

## 2023-08-04 ENCOUNTER — Telehealth: Payer: Self-pay | Admitting: Orthopedic Surgery

## 2023-08-04 NOTE — Telephone Encounter (Signed)
 I called and lvm for the therapy department with his community. Their # is [phone]

## 2023-08-04 NOTE — Telephone Encounter (Signed)
 He said he lives at [facility] and they have PT, If ok I can call #[phone] and see if they can do the PT. Please advise

## 2023-08-04 NOTE — Telephone Encounter (Signed)
 I called and gave a update to the pt

## 2023-08-04 NOTE — Telephone Encounter (Signed)
 Patient called and wanted to know if he is going to PT in home or not. He haven't heard anything. CB#[phone]

## 2023-08-04 NOTE — Telephone Encounter (Signed)
 That would be good with me

## 2023-08-04 NOTE — Telephone Encounter (Signed)
 I lvm for Calvin Cochran to cb

## 2023-08-05 ENCOUNTER — Telehealth: Payer: Self-pay | Admitting: Orthopedic Surgery

## 2023-08-05 DIAGNOSIS — Z96642 Presence of left artificial hip joint: Secondary | ICD-10-CM

## 2023-08-05 NOTE — Telephone Encounter (Signed)
 Returned call. They are ok with doing PT. I have faxed the order to [phone], I called Emiel and let him know

## 2023-08-05 NOTE — Telephone Encounter (Signed)
Tried calling again and left another message

## 2023-08-05 NOTE — Telephone Encounter (Signed)
 Amy Seidle from [facility] returning call to Autumn H. Please call Amy at [phone].

## 2023-08-10 ENCOUNTER — Encounter: Admitting: Physician Assistant

## 2023-08-10 NOTE — Progress Notes (Signed)
 Remote pacemaker transmission.

## 2023-08-13 ENCOUNTER — Encounter: Payer: Self-pay | Admitting: Surgical

## 2023-08-13 ENCOUNTER — Other Ambulatory Visit (INDEPENDENT_AMBULATORY_CARE_PROVIDER_SITE_OTHER): Payer: Self-pay

## 2023-08-13 ENCOUNTER — Ambulatory Visit (INDEPENDENT_AMBULATORY_CARE_PROVIDER_SITE_OTHER): Admitting: Surgical

## 2023-08-13 DIAGNOSIS — Z96642 Presence of left artificial hip joint: Secondary | ICD-10-CM

## 2023-08-13 NOTE — Progress Notes (Signed)
 Post-Op Visit Note   Patient: Calvin Cochran           Date of Birth: [birth year]           MRN: [id number] Visit Date: 08/13/2023 PCP: Emi Hanson, FNP   Assessment & Plan:  Chief Complaint:  Chief Complaint  Patient presents with   Left Hip - Routine Post Op    07/27/2023 Left THA   Visit Diagnoses:  1. S/P total left hip arthroplasty     Plan: Patient is a 83 year old male who presents s/p left total hip arthroplasty on 07/27/2023.  Feeling "pretty good".  Having some aching.  Still ambulating with walker.  Has about 4 sessions of home therapy remaining.  Taking occasional oxycodone  and methocarbamol .  No fevers or chills.  No chest pain or new shortness of breath.  No calf pain.  On exam, patient has intact hip flexion.  Leg lengths are roughly equal.  No calf tenderness.  Negative Homans' sign.  Palpable PT pulse.  Incision looks to be healing well without evidence of infection or dehiscence.  Steri-Strips reapplied.  Plan is continue with home health physical therapy.  Follow-up with the office in 4 weeks for clinical recheck but reach out to us  with any concerns in the meantime.  Follow-Up Instructions: No follow-ups on file.   Orders:  Orders Placed This Encounter  Procedures   XR HIP UNILAT W OR W/O PELVIS 2-3 VIEWS LEFT   No orders of the defined types were placed in this encounter.   Imaging: No results found.  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   Encounter for monitoring dofetilide  therapy 06/01/2023   Paroxysmal A-fib (HCC) 06/01/2022   Hypercoagulable state due to paroxysmal atrial fibrillation (HCC) 06/01/2022   Atrial fibrillation (HCC) 07/29/2020   Tachycardia-bradycardia syndrome (HCC) 07/29/2020   Chest pain 02/01/2017   Essential hypertension 02/01/2017   Hypertensive urgency 02/01/2017   NSTEMI (non-ST elevated myocardial infarction) [facility])     Past Medical History:  Diagnosis Date   CAD (coronary artery disease)    DES to mid LAD October 2018   Dyspnea    with exertion   Essential hypertension    NSTEMI (non-ST elevated myocardial infarction) (HCC) 01/2017   Paroxysmal atrial fibrillation (HCC)    Tachycardia-bradycardia syndrome [facility])    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 Carolynne Citron    Family History  Problem Relation Age of Onset   Lung cancer Father     Past Surgical History:  Procedure Laterality Date   COLONOSCOPY     CORONARY STENT INTERVENTION N/A 02/01/2017   Procedure: CORONARY STENT INTERVENTION;  Surgeon: Avanell Leigh, MD;  Location: [facility] INVASIVE CV LAB;  Service: Cardiovascular;  Laterality: N/A;  LAD Prox-Mid lesion stented   LEFT HEART CATH AND CORONARY ANGIOGRAPHY N/A 02/01/2017   Procedure: LEFT HEART CATH AND CORONARY ANGIOGRAPHY;  Surgeon: Avanell Leigh, MD;  Location: [facility] INVASIVE CV LAB;  Service: Cardiovascular;  Laterality: N/A;   PACEMAKER IMPLANT N/A 09/09/2020   Procedure: PACEMAKER IMPLANT;  Surgeon: Tammie Fall, MD;  Location: [facility] INVASIVE CV LAB;  Service: Cardiovascular;  Laterality: N/A;   TOTAL HIP ARTHROPLASTY Left 07/27/2023   Procedure: LEFT TOTAL HIP ARTHROPLASTY;  Surgeon: Jasmine Mesi, MD;  Location: [facility] OR;  Service: Orthopedics;  Laterality: Left;   Social History   Occupational History   Not  on file  Tobacco Use   Smoking status: Never   Smokeless tobacco: Never   Tobacco comments:    Never smoke 06/11/22  Vaping Use   Vaping status: Never Used  Substance and Sexual Activity   Alcohol use: Yes    Alcohol/week: 1.0 standard drink of alcohol    Types: 1 Shots of liquor per week    Comment: 1 drink every couple of weeks   Drug use: No   Sexual activity: Not on file

## 2023-08-18 ENCOUNTER — Ambulatory Visit: Payer: Medicare HMO | Admitting: Cardiology

## 2023-08-19 ENCOUNTER — Other Ambulatory Visit: Payer: Self-pay | Admitting: Student

## 2023-08-20 ENCOUNTER — Telehealth: Payer: Self-pay | Admitting: Orthopedic Surgery

## 2023-08-20 ENCOUNTER — Other Ambulatory Visit: Payer: Self-pay | Admitting: Surgical

## 2023-08-20 MED ORDER — OXYCODONE HCL 5 MG PO TABS: 5.0000 mg | ORAL_TABLET | Freq: Two times a day (BID) | ORAL | 0 refills | Status: DC | PRN

## 2023-08-20 NOTE — Telephone Encounter (Signed)
 Pt requesting a medication refill of oxycodone . Pt stated he feels pain at night and he needs help sleeping.

## 2023-08-20 NOTE — Telephone Encounter (Signed)
 Lvm advising

## 2023-08-20 NOTE — Telephone Encounter (Signed)
 Medication refilled

## 2023-08-22 ENCOUNTER — Other Ambulatory Visit: Payer: Self-pay | Admitting: Cardiology

## 2023-08-25 ENCOUNTER — Other Ambulatory Visit: Payer: Self-pay | Admitting: Orthopedic Surgery

## 2023-09-10 ENCOUNTER — Encounter: Payer: Self-pay | Admitting: Orthopedic Surgery

## 2023-09-10 ENCOUNTER — Other Ambulatory Visit (INDEPENDENT_AMBULATORY_CARE_PROVIDER_SITE_OTHER): Payer: Self-pay

## 2023-09-10 ENCOUNTER — Ambulatory Visit: Admitting: Orthopedic Surgery

## 2023-09-10 DIAGNOSIS — Z96642 Presence of left artificial hip joint: Secondary | ICD-10-CM

## 2023-09-10 NOTE — Progress Notes (Signed)
 Post-Op Visit Note   Patient: Calvin Cochran           Date of Birth: July 02, 1940           MRN: [id number] Visit Date: 09/10/2023 PCP: Emi Hanson, FNP   Assessment & Plan:  Chief Complaint:  Chief Complaint  Patient presents with   Left Hip - Routine Post Op   Visit Diagnoses:  1. S/P total left hip arthroplasty     Plan: Darrik is now about 6 weeks out left total hip replacement.  Been doing well.  Last night he had 1 episode of pain rating down that left leg in the anterior thigh region.  Dull toothache type pain but he had not had any of that before.  Denies any fevers or chills.  Not really bad enough to take any medication.  States that it hurts his back for him to walk.  He has lost 25 pounds since surgery.  States that the food did not taste very good after surgery.  On examination he has excellent range of motion and equal leg lengths.  No groin pain with internal or external rotation of the left hip.  Incision intact.  No fluid collection.  Plan at this time is to continue with activity as tolerated.  He actually has very good hip flexion strength bilaterally.  Minimal difference side-to-side left versus right.  I think some of the symptoms in the leg potentially could be coming from his back.  Hip replacement itself looks good.  He will follow-up with us  as needed.  Follow-Up Instructions: No follow-ups on file.   Orders:  Orders Placed This Encounter  Procedures   XR HIP UNILAT W OR W/O PELVIS 2-3 VIEWS LEFT   No orders of the defined types were placed in this encounter.   Imaging: No results found.  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   Encounter for monitoring dofetilide  therapy 06/01/2023   Paroxysmal A-fib (HCC) 06/01/2022   Hypercoagulable state due to paroxysmal atrial fibrillation (HCC) 06/01/2022   Atrial fibrillation (HCC) 07/29/2020    Tachycardia-bradycardia syndrome (HCC) 07/29/2020   Chest pain 02/01/2017   Essential hypertension 02/01/2017   Hypertensive urgency 02/01/2017   NSTEMI (non-ST elevated myocardial infarction) [facility])    Past Medical History:  Diagnosis Date   CAD (coronary artery disease)    DES to mid LAD October 2018   Dyspnea    with exertion   Essential hypertension    NSTEMI (non-ST elevated myocardial infarction) (HCC) 01/2017   Paroxysmal atrial fibrillation (HCC)    Tachycardia-bradycardia syndrome [facility])    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 Carolynne Citron    Family History  Problem Relation Age of Onset   Lung cancer Father     Past Surgical History:  Procedure Laterality Date   COLONOSCOPY     CORONARY STENT INTERVENTION N/A 02/01/2017   Procedure: CORONARY STENT INTERVENTION;  Surgeon: Avanell Leigh, MD;  Location: [facility] INVAS[facility] CV LAB;  Service: Cardiovascular;  Laterality: N/A;  LAD Prox-Mid lesion stented   LEFT HEART CATH AND CORONARY ANGIOGRAPHY N/A 02/01/2017   Procedure: LEFT HEART CATH AND CORONARY ANGIOGRAPHY;  Surgeon: Avanell Leigh, MD;  Location: [facility] INVAS[facility] CV LAB;  Service: Cardiovascular;  Laterality: N/A;   PACEMAKER IMPLANT N/A 09/09/2020   Procedure: PACEMAKER IMPLANT;  Surgeon: Tammie Fall, MD;  Location: [facility] INVASIVE CV  LAB;  Service: Cardiovascular;  Laterality: N/A;   TOTAL HIP ARTHROPLASTY Left 07/27/2023   Procedure: LEFT TOTAL HIP ARTHROPLASTY;  Surgeon: Jasmine Mesi, MD;  Location: [facility] OR;  Service: Orthopedics;  Laterality: Left;   Social History   Occupational History   Not on file  Tobacco Use   Smoking status: Never   Smokeless tobacco: Never   Tobacco comments:    Never smoke 06/11/22  Vaping Use   Vaping status: Never Used  Substance and Sexual Activity   Alcohol use: Yes    Alcohol/week: 1.0 standard drink of alcohol    Types: 1 Shots of liquor per week    Comment: 1 drink every couple of weeks   Drug use: No   Sexual activity: Not on  file

## 2023-09-22 ENCOUNTER — Telehealth: Payer: Self-pay | Admitting: Orthopedic Surgery

## 2023-09-22 NOTE — Telephone Encounter (Signed)
 Patient left message on voicemail today at 9:23am requesting a refill on his pain medication for his hip.

## 2023-09-23 ENCOUNTER — Other Ambulatory Visit: Payer: Self-pay | Admitting: Surgical

## 2023-09-23 ENCOUNTER — Telehealth: Payer: Self-pay | Admitting: *Deleted

## 2023-09-23 ENCOUNTER — Telehealth: Payer: Self-pay

## 2023-09-23 ENCOUNTER — Other Ambulatory Visit (HOSPITAL_COMMUNITY): Payer: Self-pay | Admitting: Physician Assistant

## 2023-09-23 MED ORDER — OXYCODONE HCL 5 MG PO TABS: 5.0000 mg | ORAL_TABLET | Freq: Every day | ORAL | 0 refills | Status: DC | PRN

## 2023-09-23 NOTE — Telephone Encounter (Signed)
   Pre-operative Risk Assessment    Patient Name: Calvin Cochran  DOB: 08/24/1940 MRN: [id number]   Date of last office visit: 07/22/23 DR. TAYLOR Date of next office visit: NONE   Request for Surgical Clearance    Procedure:  TRANSLAMINAR INJECTION  Date of Surgery:  Clearance 10/12/23                                Surgeon:  DR. Elna Haggis Surgeon's Group or Practice Name:  [facility] Phone number:  [phone] Fax number:  [phone] ATTN: Landa Pine   Type of Clearance Requested:   - Medical  - Pharmacy:  Hold Apixaban  (Eliquis )     Type of Anesthesia:  MAC   Additional requests/questions:    [place] Broom   09/23/2023, 10:47 AM

## 2023-09-23 NOTE — Telephone Encounter (Signed)
 Patient has been scheduled for tele preop app

## 2023-09-23 NOTE — Telephone Encounter (Signed)
 sent in refill for pain medicine.  This should be the last refill for postop pain medicine

## 2023-09-23 NOTE — Telephone Encounter (Signed)
 Patient with diagnosis of atrial fibrillation on Eliquis  for anticoagulation.    Procedure:  TRANSLAMINAR INJECTION   Date of Surgery:  Clearance 10/12/23       CHA2DS2-VASc Score = 4   This indicates a 4.8% annual risk of stroke. [name] score is based upon: CHF History: 0 HTN History: 1 Diabetes History: 0 Stroke History: 0 Vascular Disease History: 1 Age Score: 2 Gender Score: 0   CrCl 60  Platelet count 228  Patient has not had an Afib/aflutter ablation within the last 3 months or DCCV within the last 30 days  Per office protocol, patient can hold Eliquis  for 3 days prior to procedure.   Patient will not need bridging with Lovenox  (enoxaparin ) around procedure.  **This guidance is not considered finalized until pre-operative APP has relayed final recommendations.**

## 2023-09-23 NOTE — Telephone Encounter (Signed)
 Patient has been scheduled for tele preop appt med rec and consent done     Patient Consent for Virtual Visit         Calvin Cochran has provided verbal consent on 09/23/2023 for a virtual visit (video or telephone).   CONSENT FOR VIRTUAL VISIT FOR:  Calvin Cochran  By participating in this virtual visit I agree to the following:  I hereby voluntarily request, consent and authorize [facility] and its employed or contracted physicians, physician assistants, nurse practitioners or other licensed health care professionals (the Practitioner), to provide me with telemedicine health care services (the "Services) as deemed necessary by the treating Practitioner. I acknowledge and consent to receive the Services by the Practitioner via telemedicine. I understand that the telemedicine visit will involve communicating with the Practitioner through live audiovisual communication technology and the disclosure of certain medical information by electronic transmission. I acknowledge that I have been given the opportunity to request an in-person assessment or other available alternative prior to the telemedicine visit and am voluntarily participating in the telemedicine visit.  I understand that I have the right to withhold or withdraw my consent to the use of telemedicine in the course of my care at any time, without affecting my right to future care or treatment, and that the Practitioner or I may terminate the telemedicine visit at any time. I understand that I have the right to inspect all information obtained and/or recorded in the course of the telemedicine visit and may receive copies of available information for a reasonable fee.  I understand that some of the potential risks of receiving the Services via telemedicine include:  Delay or interruption in medical evaluation due to technological equipment failure or disruption; Information transmitted may not be sufficient (e.g. poor resolution  of images) to allow for appropriate medical decision making by the Practitioner; and/or  In rare instances, security protocols could fail, causing a breach of personal health information.  Furthermore, I acknowledge that it is my responsibility to provide information about my medical history, conditions and care that is complete and accurate to the best of my ability. I acknowledge that Practitioner's advice, recommendations, and/or decision may be based on factors not within their control, such as incomplete or inaccurate data provided by me or distortions of diagnostic images or specimens that may result from electronic transmissions. I understand that the practice of medicine is not an exact science and that Practitioner makes no warranties or guarantees regarding treatment outcomes. I acknowledge that a copy of this consent can be made available to me via my patient portal [facility] MyChart), or I can request a printed copy by calling the office of [facility].    I understand that my insurance will be billed for this visit.   I have read or had this consent read to me. I understand the contents of this consent, which adequately explains the benefits and risks of the Services being provided via telemedicine.  I have been provided ample opportunity to ask questions regarding this consent and the Services and have had my questions answered to my satisfaction. I give my informed consent for the services to be provided through the use of telemedicine in my medical care

## 2023-09-23 NOTE — Telephone Encounter (Signed)
   Name: Calvin Cochran  DOB: 1941/02/04  MRN: [id number]  Primary Cardiologist: Teddie Favre, MD   Preoperative team, please contact this patient and set up a phone call appointment for further preoperative risk assessment. Please obtain consent and complete medication review. Thank you for your help.  I confirm that guidance regarding antiplatelet and oral anticoagulation therapy has been completed and, if necessary, noted below.  Per office protocol, patient can hold Eliquis  for 3 days prior to procedure.   Patient will not need bridging with Lovenox  (enoxaparin ) around procedure.  I also confirmed the patient resides in the state of  . As per [facility] Medical Board telemedicine laws, the patient must reside in the state in which the provider is licensed.   Carie Charity, NP 09/23/2023, 3:15 PM [facility]

## 2023-09-27 ENCOUNTER — Other Ambulatory Visit: Payer: Self-pay | Admitting: Orthopedic Surgery

## 2023-09-27 NOTE — Telephone Encounter (Signed)
 Ok for  eliquis  5 mg po am and 2.5 mg pm until he sees his cardiologist   pls see when he will be seeing then thx

## 2023-09-28 NOTE — Telephone Encounter (Signed)
 Hi [name].  Can you make sure Calvin Cochran is just taking Eliquis  5 mg in the morning and 2 and half milligrams in the afternoon and then his cardiologist can adjust the dose.  Thanks

## 2023-09-29 ENCOUNTER — Ambulatory Visit

## 2023-09-29 NOTE — Telephone Encounter (Addendum)
 I am going to send a message to [facility] scheduling to see if they can get the pt in for preop clearance. Preop APP stated cannot do a tele preop appt as pt lived in [place].  Procedure is planned for 10/12/23.   I will update the requesting office the pt needs in office appt.

## 2023-09-29 NOTE — Progress Notes (Signed)
 This encounter was created in error - please disregard.

## 2023-09-29 NOTE — Telephone Encounter (Signed)
   Name: Calvin Cochran  DOB: 10/27/1940  MRN: [id number]  Primary Cardiologist: Jayson Sierras, MD  Chart reviewed as part of pre-operative protocol coverage. Because of Calvin Cochran's past medical history and time since last visit, he will require a follow-up in-office visit in order to better assess preoperative cardiovascular risk. Patient resides in [place] , will be unable to provide tele clearance as per [place] Medical Board telemedicine laws, the patient must reside in the state in which the provider is licensed.  Pre-op covering staff: - Please schedule appointment and call patient to inform them. If patient already had an upcoming appointment within acceptable timeframe, please add pre-op clearance to the appointment notes so provider is aware. - Please contact requesting surgeon's office via preferred method (i.e, phone, fax) to inform them of need for appointment prior to surgery.  Calvin Charon D Makensie Mulhall, NP  09/29/2023, 1:17 PM

## 2023-09-29 NOTE — Telephone Encounter (Signed)
 I s/w the pt, very nice man, I explained the reasons why he had to have in office appt as opposed to tele appt. Pt agreeable to appt in [place] at our [facility] location with Rosaline Bane, NP 10/04/23 @ 2:45 for preop clearance.   I will update all parties involved.

## 2023-09-29 NOTE — Telephone Encounter (Signed)
 Pt needs in office appt as he lives in [place] and not in [place]. Per protocol provider cannot practice medicine in a state they are not licensed.

## 2023-09-29 NOTE — Telephone Encounter (Signed)
 Patient called in regarding tele visit being cancelled. Soonest available in [place] is 8/21 on my end. I made patient aware of this. Patient says he will come to [facility] is he absolutely has to but prefers not to drive 1+ hours, and would like to access other options first. Patient would also like to know why he needs to be seen in the office. Please advise.

## 2023-10-01 ENCOUNTER — Ambulatory Visit (INDEPENDENT_AMBULATORY_CARE_PROVIDER_SITE_OTHER): Payer: Medicare HMO

## 2023-10-01 DIAGNOSIS — I495 Sick sinus syndrome: Secondary | ICD-10-CM | POA: Diagnosis not present

## 2023-10-01 LAB — CUP PACEART REMOTE DEVICE CHECK
Battery Remaining Longevity: 82 mo
Battery Remaining Percentage: 73 %
Battery Voltage: 3.01 V
Brady Statistic AP VP Percent: 1 %
Brady Statistic AP VS Percent: 27 %
Brady Statistic AS VP Percent: 2.8 %
Brady Statistic AS VS Percent: 70 %
Brady Statistic RA Percent Paced: 22 %
Brady Statistic RV Percent Paced: 10 %
Date Time Interrogation Session: 20250627020022
Implantable Lead Connection Status: 753985
Implantable Lead Connection Status: 753985
Implantable Lead Implant Date: 20220606
Implantable Lead Implant Date: 20220606
Implantable Lead Location: 753859
Implantable Lead Location: 753860
Implantable Pulse Generator Implant Date: 20220606
Lead Channel Impedance Value: 380 Ohm
Lead Channel Impedance Value: 400 Ohm
Lead Channel Pacing Threshold Amplitude: 0.5 V
Lead Channel Pacing Threshold Amplitude: 1 V
Lead Channel Pacing Threshold Pulse Width: 0.5 ms
Lead Channel Pacing Threshold Pulse Width: 0.5 ms
Lead Channel Sensing Intrinsic Amplitude: 10.3 mV
Lead Channel Sensing Intrinsic Amplitude: 3.6 mV
Lead Channel Setting Pacing Amplitude: 2 V
Lead Channel Setting Pacing Amplitude: 2.5 V
Lead Channel Setting Pacing Pulse Width: 0.5 ms
Lead Channel Setting Sensing Sensitivity: 2 mV
Pulse Gen Model: 2272
Pulse Gen Serial Number: 6460902

## 2023-10-02 NOTE — Progress Notes (Unsigned)
 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 PPM followed by Dr. Birmingham, EP cards and general cardiologist is Dr. Sierras. He was felt to be low risk for complications to undergo hip replacement 07/2023.  Last cardiology clinic visit was 07/22/23 with Dr. Birmingham. He is on dofetilide  due to history of a fib with device noting a fib 20% burden. He is not device dependent. He reported occasional dyspnea for which he was advised to continue Lasix .   History of Present Illness Discussed the use of AI scribe software for clinical note transcription with the patient, who gave verbal consent to proceed.  Calvin Cochran is a very pleasant 83 y.o. male who is here today for preoperative cardiac evaluation for upcoming spinal injection.  He reports he is feeling well and remains active with regular swimming and walking.  He previously cycled long distances.  He continues to recover from hip surgery in April with increasing strength due to PT. he denies chest pain, palpitations, shortness of breath, edema, orthopnea, PND, presyncope, syncope.  Monitors BP consistently at home which is well controlled. No bleeding concerns. He reports no specific cardiac concerns today.  He reports he received a call from our office to reduce Eliquis  to half a tablet in the evenings, however I cannot find record of this.  ROS: See HPI  Studies Reviewed EKG  Interpretation Date/Time:  Monday October 04 2023 14:46:34 EDT Ventricular Rate:  63 PR Interval:  208 QRS Duration:  76 QT Interval:  476 QTC Calculation: 487 R Axis:   -9  Text Interpretation: Sinus rhythm with Premature supraventricular complexes Nonspecific ST abnormality Prolonged QT Confirmed by Percy Browning [phone]) on 10/04/2023 7:12:07 PM    No results found for: LIPOA  Risk Assessment/Calculations  CHA2DS2-VASc Score = 4   This indicates a 4.8% annual risk of stroke. [name] score is based upon: CHF History: 0 HTN History: 1 Diabetes History: 0 Stroke History: 0 Vascular Disease History: 1 Age Score: 2 Gender Score: 0        STOP-Bang Score:         Physical Exam VS:  BP 114/72 (BP Location: Left Arm, Patient Position: Sitting, Cuff Size: Normal)   Pulse 63   Ht 5' 7 (1.702 m)   Wt 187 lb (84.8 kg)   SpO2 98%   BMI 29.29 kg/m    Wt Readings from Last 3 Encounters:  10/04/23 187 lb (84.8 kg)  07/27/23 191 lb 2.2 oz (86.7 kg)  07/22/23 191 lb 3.2 oz (86.7 kg)    GEN: Well nourished, well developed in no acute distress NECK: No JVD; No carotid bruits CARDIAC: RRR, no murmurs, rubs, gallops RESPIRATORY:  Clear to auscultation without rales, wheezing or rhonchi  ABDOMEN: Soft, non-tender, non-distended EXTREMITIES:  No edema; No deformity   ASSESSMENT AND PLAN  Preoperative Cardiac Evaluation According to the Revised Cardiac Risk Index (RCRI), his Perioperative Risk of Major Cardiac  Event is (%): 0.9 which is low risk for low risk procedure. His Functional Capacity in METs is: 8.42 according to the Duke Activity Status Index (DASI).' The patient is doing well from a cardiac perspective. Therefore, based on ACC/AHA guidelines, the patient would be at acceptable risk for the planned procedure without further cardiovascular testing.  Per office protocol, he may hold Eliquis  for 3 days prior to injection and should resume as soon as hemodynamically stable  postprocedure.  I will forward to requesting provider.  CAD History of DES to LAD in 2018. He denies chest pain, dyspnea, or other symptoms concerning for angina.  No indication for further ischemic evaluation at this time. Lipids have been managed by PCP.   PAF on chronic anticoagulation High risk medication monitoring Clinically appears to be in sinus rhythm today.  HR is well-controlled.  Continue dofetilide  for rhythm control.  reports he was told by someone from our office to decrease Eliquis  to 5 mg in the a.m. and 2.5 mg in the p.m., however I cannot find documentation of this.  Based on age, kidney function, and weight, he should continue Eliquis  5 mg twice daily which is appropriate dose for stroke prevention for CHA2DS2-VASc score of 4.  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 Corrected QT using Bazett calculation reveals QT 451 ms. He is asymptomatic. Continue dofetilide  at 125 mcg twice daily. Keep follow-up with [facility] in August.   Hypertension BP is well controlled. Renal function stable on labs completed 07/19/23. No change in anti-hypertensive therapy today.   Tachy/brady syndrome s/p PPM implant Remote pacemaker interrogation from 10/01/2023 a paced V sensed rhythm, stable device function.  Management per EP cardiology.       Dispo: August with [facility]/April 2026 with Dr. Debera  Signed, Rosaline Bane, NP-C

## 2023-10-04 ENCOUNTER — Encounter (HOSPITAL_BASED_OUTPATIENT_CLINIC_OR_DEPARTMENT_OTHER): Payer: Self-pay | Admitting: Nurse Practitioner

## 2023-10-04 ENCOUNTER — Ambulatory Visit (HOSPITAL_BASED_OUTPATIENT_CLINIC_OR_DEPARTMENT_OTHER): Admitting: Nurse Practitioner

## 2023-10-04 ENCOUNTER — Ambulatory Visit: Payer: Self-pay | Admitting: Internal Medicine

## 2023-10-04 VITALS — BP 114/72 | HR 63 | Ht 67.0 in | Wt 187.0 lb

## 2023-10-04 DIAGNOSIS — I480 Paroxysmal atrial fibrillation: Secondary | ICD-10-CM

## 2023-10-04 DIAGNOSIS — I10 Essential (primary) hypertension: Secondary | ICD-10-CM | POA: Diagnosis not present

## 2023-10-04 DIAGNOSIS — Z01810 Encounter for preprocedural cardiovascular examination: Secondary | ICD-10-CM | POA: Diagnosis not present

## 2023-10-04 DIAGNOSIS — I2510 Atherosclerotic heart disease of native coronary artery without angina pectoris: Secondary | ICD-10-CM | POA: Diagnosis not present

## 2023-10-04 DIAGNOSIS — I495 Sick sinus syndrome: Secondary | ICD-10-CM

## 2023-10-04 DIAGNOSIS — D6869 Other thrombophilia: Secondary | ICD-10-CM

## 2023-10-04 DIAGNOSIS — E785 Hyperlipidemia, unspecified: Secondary | ICD-10-CM

## 2023-10-04 DIAGNOSIS — Z950 Presence of cardiac pacemaker: Secondary | ICD-10-CM

## 2023-10-04 NOTE — Patient Instructions (Signed)
 Medication Instructions:   Your physician recommends that you continue on your current medications as directed. Please refer to the Current Medication list given to you today.   *If you need a refill on your cardiac medications before your next appointment, please call your pharmacy*  Lab Work:  None ordered.  If you have labs (blood work) drawn today and your tests are completely normal, you will receive your results only by: MyChart Message (if you have MyChart) OR A paper copy in the mail If you have any lab test that is abnormal or we need to change your treatment, we will call you to review the results.  Testing/Procedures:  None ordered.   Follow-Up: At [facility], you and your health needs are our priority.  As part of our continuing mission to provide you with exceptional heart care, our providers are all part of one team.  This team includes your primary Cardiologist (physician) and Advanced Practice Providers or APPs (Physician Assistants and Nurse Practitioners) who all work together to provide you with the care you need, when you need it.  Your next appointment:   10 month(s)  Provider:   You may see Jayson Sierras, MD or one of the following Advanced Practice Providers on your designated Care Team:   Lonni Meager, NP Lesley Maffucci, PA-C Bernardino Bring, PA-C Cadence Washington, PA-C Tylene Lunch, NP Barnie Hila, NP    We recommend signing up for the patient portal called MyChart.  Sign up information is provided on this After Visit Summary.  MyChart is used to connect with patients for Virtual Visits (Telemedicine).  Patients are able to view lab/test results, encounter notes, upcoming appointments, etc.  Non-urgent messages can be sent to your provider as well.   To learn more about what you can do with MyChart, go to [email or web address].   Other Instructions  Your physician wants you to follow-up in: 10 months.  You will receive a reminder  letter in the mail two months in advance. If you don't receive a letter, please call our office to schedule the follow-up appointment.

## 2023-10-05 NOTE — Discharge Summary (Signed)
 Physician Discharge Summary      Patient ID: Calvin Cochran MRN: [id number] DOB/AGE: [birth year] 83 y.o.  Admit date: 07/27/2023 Discharge date: 07/29/2023  Admission Diagnoses:  Principal Problem:   S/P hip replacement, left Active Problems:   Arthritis of left hip   Discharge Diagnoses:  Same  Surgeries: Procedure(s): LEFT TOTAL HIP ARTHROPLASTY on 07/27/2023   Consultants:   Discharged Condition: Stable  Hospital Course: Calvin Cochran is an 83 y.o. male who was admitted 07/27/2023 with a chief complaint of left hip pain, and found to have a diagnosis of left hip arthritis.  They were brought to the operating room on 07/27/2023 and underwent the above named procedures.  Pt awoke from anesthesia without complication and was transferred to the floor. On POD1, patient's pain was moderate but controlled.  He was able to ambulate well with PT.  After PT, his pain did increase in intensity and he had to stay another night but was discharged on POD 2 after pain improved.  No red flag signs or symptoms throughout his stay..  Pt will f/u with Dr. Addie in clinic in ~2 weeks.   Antibiotics given:  Anti-infectives (From admission, onward)    Start     Dose/Rate Route Frequency Ordered Stop   07/27/23 2300  ceFAZolin  (ANCEF ) IVPB 2g/100 mL premix        2 g 200 mL/hr over 30 Minutes Intravenous Every 8 hours 07/27/23 1957 07/28/23 0631   07/27/23 1635  vancomycin  (VANCOCIN ) powder  Status:  Discontinued          As needed 07/27/23 1635 07/27/23 1743   07/27/23 0900  ceFAZolin  (ANCEF ) IVPB 2g/100 mL premix        2 g 200 mL/hr over 30 Minutes Intravenous On call to O.R. 07/27/23 9153 07/27/23 1544     .  Recent vital signs:  Vitals:   07/29/23 0531 07/29/23 0807  BP: 92/66 102/60  Pulse: 74 76  Resp: 17 16  Temp: 98.6 F (37 C) 98.7 F (37.1 C)  SpO2: 96% 97%    Recent laboratory studies:  Results for orders placed or performed during the hospital encounter of 07/27/23   ABO/Rh   Collection Time: 07/27/23  9:25 AM  Result Value Ref Range   ABO/RH(D)      A POS Performed at [facility], [address], [place], [place] [address]   Basic metabolic panel with GFR   Collection Time: 07/28/23  8:46 AM  Result Value Ref Range   Sodium 135 135 - 145 mmol/L   Potassium 4.6 3.5 - 5.1 mmol/L   Chloride 102 98 - 111 mmol/L   CO2 22 22 - 32 mmol/L   Glucose, Bld 171 (H) 70 - 99 mg/dL   BUN 24 (H) 8 - 23 mg/dL   Creatinine, Ser 8.81 0.61 - 1.24 mg/dL   Calcium  8.9 8.9 - 10.3 mg/dL   GFR, Estimated >39 >39 mL/min   Anion gap 11 5 - 15  Magnesium    Collection Time: 07/28/23  8:46 AM  Result Value Ref Range   Magnesium  1.6 (L) 1.7 - 2.4 mg/dL  Basic metabolic panel with GFR   Collection Time: 07/29/23  6:08 AM  Result Value Ref Range   Sodium 136 135 - 145 mmol/L   Potassium 4.5 3.5 - 5.1 mmol/L   Chloride 104 98 - 111 mmol/L   CO2 24 22 - 32 mmol/L   Glucose, Bld 120 (H) 70 - 99 mg/dL  BUN 27 (H) 8 - 23 mg/dL   Creatinine, Ser 8.80 0.61 - 1.24 mg/dL   Calcium  8.5 (L) 8.9 - 10.3 mg/dL   GFR, Estimated >39 >39 mL/min   Anion gap 8 5 - 15  Magnesium    Collection Time: 07/29/23  6:08 AM  Result Value Ref Range   Magnesium  2.0 1.7 - 2.4 mg/dL    Discharge Medications:   Allergies as of 07/29/2023       Reactions   Amoxicillin Itching        Medication List     STOP taking these medications    apixaban  2.5 MG Tabs tablet Commonly known as: Eliquis    traMADol  50 MG tablet Commonly known as: Ultram        TAKE these medications    amLODipine  10 MG tablet Commonly known as: NORVASC  TAKE 1 TABLET BY MOUTH EVERY DAY   atorvastatin  80 MG tablet Commonly known as: LIPITOR  Take 1 tablet (80 mg total) by mouth daily.   docusate sodium  100 MG capsule Commonly known as: COLACE Take 1 capsule (100 mg total) by mouth 2 (two) times daily.   losartan  50 MG tablet Commonly known as: COZAAR  TAKE 1 TABLET BY MOUTH EVERY DAY    Magnesium  Extra Strength 400 MG Caps Generic drug: Magnesium  Oxide -Mg Supplement TAKE 1 CAPSULE BY MOUTH DAILY.   nitroGLYCERIN  0.4 MG SL tablet Commonly known as: NITROSTAT  Place 1 tablet (0.4 mg total) under the tongue every 5 (five) minutes x 3 doses as needed for chest pain.   potassium chloride  SA 20 MEQ tablet Commonly known as: KLOR-CON  M Take 2 tablets (40 mEq total) by mouth daily.   Topicaine 5 5 % Gel Generic drug: Lidocaine  (Anorectal) Apply 1 Application topically daily as needed (pain).        Diagnostic Studies: CUP PACEART REMOTE DEVICE CHECK Result Date: 10/01/2023 PPM scheduled remote reviewed. Normal device function.  Presenting rhythm: AP-VS Several AMS events. Longest x 3 min.  OAC per chart. Next remote 91 days. AB, CVRS  XR HIP UNILAT W OR W/O PELVIS 2-3 VIEWS LEFT Result Date: 09/10/2023 AP pelvis lateral radiographs left hip reviewed.  Hip replacement in good position alignment with no complicating features on the left-hand side   Disposition: Discharge disposition: 01-Home or Self Care       Discharge Instructions     Call MD / Call 911   Complete by: As directed    If you experience chest pain or shortness of breath, CALL 911 and be transported to the hospital emergency room.  If you develope a fever above 101 F, pus (white drainage) or increased drainage or redness at the wound, or calf pain, call your surgeon's office.   Constipation Prevention   Complete by: As directed    Drink plenty of fluids.  Prune juice may be helpful.  You may use a stool softener, such as Colace (over the counter) 100 mg twice a day.  Use MiraLax (over the counter) for constipation as needed.   Diet - low sodium heart healthy   Complete by: As directed    Discharge instructions   Complete by: As directed    You may shower, dressing is waterproof.  Do not remove the dressing, we will remove it at your first post-op appointment.  Do not take a bath or soak the hip in  a tub or pool.  You may weightbear as you can tolerate on the operative leg with a walker. You will follow-up  with Dr. Addie or Herlene RIGGERS in the clinic in 2 weeks at your given appointment date.  Call the office at [phone] with any questions or concerns urgently or send a message on MyChart with any nonurgent concerns.  INSTRUCTIONS AFTER JOINT REPLACEMENT   Remove items at home which could result in a fall. This includes throw rugs or furniture in walking pathways ICE to the affected joint every three hours while awake for 30 minutes at a time, for at least the first 3-5 days, and then as needed for pain and swelling.  Continue to use ice for pain and swelling. You may notice swelling that will progress down to the foot and ankle.  This is normal after surgery.  Elevate your leg when you are not up walking on it.   Continue to use the breathing machine you got in the hospital (incentive spirometer) which will help keep your temperature down.  It is common for your temperature to cycle up and down following surgery, especially at night when you are not up moving around and exerting yourself.  The breathing machine keeps your lungs expanded and your temperature down.   DIET:  As you were doing prior to hospitalization, we recommend a well-balanced diet.  DRESSING / WOUND CARE / SHOWERING  Keep the surgical dressing until follow up.  The dressing is water  proof, so you can shower without any extra covering.  IF THE DRESSING FALLS OFF or the wound gets wet inside, change the dressing with sterile gauze.  Please use good hand washing techniques before changing the dressing.  Do not use any lotions or creams on the incision until instructed by your surgeon.    ACTIVITY  Increase activity slowly as tolerated, but follow the weight bearing instructions below.   No driving for 6 weeks or until further direction given by your physician.  You cannot drive while taking narcotics.  No lifting or carrying  greater than 10 lbs. until further directed by your surgeon. Avoid periods of inactivity such as sitting longer than an hour when not asleep. This helps prevent blood clots.  You may return to work once you are authorized by your doctor.     WEIGHT BEARING   Weight bearing as tolerated with assist device (walker, cane, etc) as directed, use it as long as suggested by your surgeon or therapist, typically at least 4-6 weeks.   EXERCISES  Results after joint replacement surgery are often greatly improved when you follow the exercise, range of motion and muscle strengthening exercises prescribed by your doctor. Safety measures are also important to protect the joint from further injury. Any time any of these exercises cause you to have increased pain or swelling, decrease what you are doing until you are comfortable again and then slowly increase them. If you have problems or questions, call your caregiver or physical therapist for advice.   Rehabilitation is important following a joint replacement. After just a few days of immobilization, the muscles of the leg can become weakened and shrink (atrophy).  These exercises are designed to build up the tone and strength of the thigh and leg muscles and to improve motion. Often times heat used for twenty to thirty minutes before working out will loosen up your tissues and help with improving the range of motion but do not use heat for the first two weeks following surgery (sometimes heat can increase post-operative swelling).   These exercises can be done on a training (exercise) mat, on the  floor, on a table or on a bed. Use whatever works the best and is most comfortable for you.    Use music or television while you are exercising so that the exercises are a pleasant break in your day. This will make your life better with the exercises acting as a break in your routine that you can look forward to.   Perform all exercises about fifteen times, three times  per day or as directed.  You should exercise both the operative leg and the other leg as well.  Exercises include:   Quad Sets - Tighten up the muscle on the front of the thigh (Quad) and hold for 5-10 seconds.   Straight Leg Raises - With your knee straight (if you were given a brace, keep it on), lift the leg to 60 degrees, hold for 3 seconds, and slowly lower the leg.  Perform this exercise against resistance later as your leg gets stronger.  Leg Slides: Lying on your back, slowly slide your foot toward your buttocks, bending your knee up off the floor (only go as far as is comfortable). Then slowly slide your foot back down until your leg is flat on the floor again.  Angel Wings: Lying on your back spread your legs to the side as far apart as you can without causing discomfort.  Hamstring Strength:  Lying on your back, push your heel against the floor with your leg straight by tightening up the muscles of your buttocks.  Repeat, but this time bend your knee to a comfortable angle, and push your heel against the floor.  You may put a pillow under the heel to make it more comfortable if necessary.   A rehabilitation program following joint replacement surgery can speed recovery and prevent re-injury in the future due to weakened muscles. Contact your doctor or a physical therapist for more information on knee rehabilitation.    CONSTIPATION  Constipation is defined medically as fewer than three stools per week and severe constipation as less than one stool per week.  Even if you have a regular bowel pattern at home, your normal regimen is likely to be disrupted due to multiple reasons following surgery.  Combination of anesthesia, postoperative narcotics, change in appetite and fluid intake all can affect your bowels.   YOU MUST use at least one of the following options; they are listed in order of increasing strength to get the job done.  They are all available over the counter, and you may need  to use some, POSSIBLY even all of these options:    Drink plenty of fluids (prune juice may be helpful) and high fiber foods Colace 100 mg by mouth twice a day  Senokot for constipation as directed and as needed Dulcolax (bisacodyl), take with full glass of water   Miralax (polyethylene glycol) once or twice a day as needed.  If you have tried all these things and are unable to have a bowel movement in the first 3-4 days after surgery call either your surgeon or your primary doctor.    If you experience loose stools or diarrhea, hold the medications until you stool forms back up.  If your symptoms do not get better within 1 week or if they get worse, check with your doctor.  If you experience the worst abdominal pain ever or develop nausea or vomiting, please contact the office immediately for further recommendations for treatment.   ITCHING:  If you experience itching with your medications, try taking only  a single pain pill, or even half a pain pill at a time.  You can also use Benadryl over the counter for itching or also to help with sleep.   TED HOSE STOCKINGS:  Use stockings on both legs until for at least 2 weeks or as directed by physician office. They may be removed at night for sleeping.  MEDICATIONS:  See your medication summary on the After Visit Summary that nursing will review with you.  You may have some home medications which will be placed on hold until you complete the course of blood thinner medication.  It is important for you to complete the blood thinner medication as prescribed.  PRECAUTIONS:  If you experience chest pain or shortness of breath - call 911 immediately for transfer to the hospital emergency department.   If you develop a fever greater that 101 F, purulent drainage from wound, increased redness or drainage from wound, foul odor from the wound/dressing, or calf pain - CONTACT YOUR SURGEON.                                                   FOLLOW-UP  APPOINTMENTS:  If you do not already have a post-op appointment, please call the office for an appointment to be seen by your surgeon.  Guidelines for how soon to be seen are listed in your After Visit Summary, but are typically between 1-4 weeks after surgery.   POST-OPERATIVE OPIOID TAPER INSTRUCTIONS: It is important to wean off of your opioid medication as soon as possible. If you do not need pain medication after your surgery it is ok to stop day one. Opioids include: Codeine, Hydrocodone (Norco, Vicodin), Oxycodone (Percocet, oxycontin ) and hydromorphone  amongst others.  Long term and even short term use of opiods can cause: Increased pain response Dependence Constipation Depression Respiratory depression And more.  Withdrawal symptoms can include Flu like symptoms Nausea, vomiting And more Techniques to manage these symptoms Hydrate well Eat regular healthy meals Stay active Use relaxation techniques(deep breathing, meditating, yoga) Do Not substitute Alcohol to help with tapering If you have been on opioids for less than two weeks and do not have pain than it is ok to stop all together.  Plan to wean off of opioids This plan should start within one week post op of your joint replacement. Maintain the same interval or time between taking each dose and first decrease the dose.  Cut the total daily intake of opioids by one tablet each day Next start to increase the time between doses. The last dose that should be eliminated is the evening dose.   MAKE SURE YOU:  Understand these instructions.  Get help right away if you are not doing well or get worse.    Thank you for letting us  be a part of your medical care team.  It is a privilege we respect greatly.  We hope these instructions will help you stay on track for a fast and full recovery!    Dental Antibiotics:  In most cases prophylactic antibiotics for Dental procdeures after total joint surgery are not  necessary.  Exceptions are as follows:  1. History of prior total joint infection  2. Severely immunocompromised (Organ Transplant, cancer chemotherapy, Rheumatoid biologic meds such as Humera)  3. Poorly controlled diabetes (A1C &gt; 8.0, blood glucose over 200)  If you have one  of these conditions, contact your surgeon for an antibiotic prescription, prior to your dental procedure.   Increase activity slowly as tolerated   Complete by: As directed    Post-operative opioid taper instructions:   Complete by: As directed    POST-OPERATIVE OPIOID TAPER INSTRUCTIONS: It is important to wean off of your opioid medication as soon as possible. If you do not need pain medication after your surgery it is ok to stop day one. Opioids include: Codeine, Hydrocodone (Norco, Vicodin), Oxycodone (Percocet, oxycontin ) and hydromorphone  amongst others.  Long term and even short term use of opiods can cause: Increased pain response Dependence Constipation Depression Respiratory depression And more.  Withdrawal symptoms can include Flu like symptoms Nausea, vomiting And more Techniques to manage these symptoms Hydrate well Eat regular healthy meals Stay active Use relaxation techniques(deep breathing, meditating, yoga) Do Not substitute Alcohol to help with tapering If you have been on opioids for less than two weeks and do not have pain than it is ok to stop all together.  Plan to wean off of opioids This plan should start within one week post op of your joint replacement. Maintain the same interval or time between taking each dose and first decrease the dose.  Cut the total daily intake of opioids by one tablet each day Next start to increase the time between doses. The last dose that should be eliminated is the evening dose.             SignedBETHA Herlene Calix 10/05/2023, 3:49 PM

## 2023-10-12 ENCOUNTER — Telehealth: Payer: Self-pay | Admitting: Cardiology

## 2023-10-12 MED ORDER — FUROSEMIDE 20 MG PO TABS: 20.0000 mg | ORAL_TABLET | ORAL | 3 refills | Status: AC

## 2023-10-12 NOTE — Telephone Encounter (Signed)
 Pt's medication was sent to pt's pharmacy as requested. Confirmation received.

## 2023-10-12 NOTE — Telephone Encounter (Signed)
*  STAT* If patient is at the pharmacy, call can be transferred to refill team.   1. Which medications need to be refilled? (please list name of each medication and dose if known)  furosemide  (LASIX ) 20 MG tablet   2. Which pharmacy/location (including street and city if local pharmacy) is medication to be sent to? [id number] - [place], [place] - [id number]    3. Do they need a 30 day or 90 day supply?  90 day supply  Patient says he is completely out of medication.

## 2023-10-13 ENCOUNTER — Other Ambulatory Visit: Payer: Self-pay | Admitting: Orthopedic Surgery

## 2023-10-13 NOTE — Telephone Encounter (Signed)
 Hi.  Please have his cardiology office refill this medication at the appropriate dose.  Thanks

## 2023-10-13 NOTE — Telephone Encounter (Signed)
 Prescription refill request for Eliquis  received. Indication: Last office visit: 10/04/23  CHRISTELLA Bane NP Scr: 1.19 on 07/29/23  Epic Age: 83 Weight: 84.8kg  Based on above findings Eliquis  5mg  twice daily is the appropriate dose.  Refill approved.

## 2023-12-01 ENCOUNTER — Ambulatory Visit (HOSPITAL_COMMUNITY)
Admission: RE | Admit: 2023-12-01 | Discharge: 2023-12-01 | Disposition: A | Discharge status: Home / Self Care | Payer: Medicare HMO | Source: Ambulatory Visit | Attending: Physician Assistant | Admitting: Physician Assistant

## 2023-12-01 VITALS — BP 104/60 | HR 68 | Ht 67.0 in | Wt 185.4 lb

## 2023-12-01 DIAGNOSIS — D6869 Other thrombophilia: Secondary | ICD-10-CM

## 2023-12-01 DIAGNOSIS — I4891 Unspecified atrial fibrillation: Secondary | ICD-10-CM

## 2023-12-01 DIAGNOSIS — I480 Paroxysmal atrial fibrillation: Secondary | ICD-10-CM

## 2023-12-01 DIAGNOSIS — M1612 Unilateral primary osteoarthritis, left hip: Secondary | ICD-10-CM

## 2023-12-01 NOTE — Progress Notes (Signed)
 Primary Care Physician: Junette Bouchard, FNP Primary Cardiologist: Dr Debera Primary Electrophysiologist: Dr Waddell Referring Physician: Dr Waddell Calvin Cochran is a 83 y.o. male with a history of CAD, HTN, tachybradycardia syndrome s/p PPM, atrial fibrillation who presents for follow up in the [facility]. Patient noted to have a higher burden on afib on his PPM, >20% and Dr Waddell recommended dofetilide . Patient is on Eliquis  for stroke prevention.   Patient is s/p dofetilide  loading 2/26-2/29/24. His dose was decreased for QT prolongation at his visit on 06/11/22.  Patient returns for follow up for atrial fibrillation and dofetilide  monitoring. He reports that he has done well since his last visit. His PPM shows the longest episode of afib was only 3 minutes. No bleeding issues on anticoagulation. He has lost ~15 lbs through diet.   Today, he  denies symptoms of palpitations, chest pain, shortness of breath, orthopnea, PND, lower extremity edema, dizziness, presyncope, syncope, snoring, daytime somnolence, bleeding, or neurologic sequela. The patient is tolerating medications without difficulties and is otherwise without complaint today.    Atrial Fibrillation Risk Factors:  he does not have symptoms or diagnosis of sleep apnea. he does not have a history of rheumatic fever.   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 Waddell    ROS- All systems are reviewed and negative except  as per the HPI above.  Physical Exam: Vitals:   12/01/23 1014  BP: 104/60  Pulse: 68  Weight: 84.1 kg  Height: 5' 7 (1.702 m)    GEN: Well nourished, well developed in no acute distress CARDIAC: Regular rate and rhythm, no murmurs, rubs, gallops RESPIRATORY:  Clear to auscultation without rales, wheezing or rhonchi  ABDOMEN: Soft, non-tender, non-distended EXTREMITIES:  No edema; No deformity    Wt Readings from Last 3 Encounters:  12/01/23 84.1 kg  10/04/23 84.8 kg  07/27/23 86.7 kg    EKG today demonstrates  SR, 1st degree AV block Vent. rate 68 BPM PR interval 204 ms QRS duration 76 ms QT/QTcB 398/423 ms   Echo 08/12/21 demonstrated   1. Left ventricular ejection fraction, by estimation, is 65 to 70%. The  left ventricle has normal function. The left ventricle has no regional  wall motion abnormalities. Left ventricular diastolic parameters are  indeterminate. The average left ventricular global longitudinal strain is -20.0 %. The global longitudinal strain is normal.   2. Right ventricular systolic function is normal. The right ventricular  size is normal. There is normal pulmonary artery systolic pressure.   3. The mitral valve was not well visualized. No evidence of mitral valve  regurgitation. No evidence of mitral stenosis.   4. The aortic valve is tricuspid. Aortic valve regurgitation is mild. No  aortic stenosis is present.   5. The inferior vena cava is normal in size with greater than 50%  respiratory variability, suggesting right atrial pressure of 3 mmHg.   Comparison(s): LVEF 60-65%.   Epic records are reviewed at length today  CHA2DS2-VASc Score =  4  [name] score is based upon: CHF History: 0 HTN History: 1 Diabetes History: 0 Stroke History: 0 Vascular Disease History: 1 Age Score: 2 Gender Score: 0       ASSESSMENT AND PLAN: Paroxysmal Atrial Fibrillation (ICD10:  I48.0) [name] CHA2DS2-VASc score is 4, indicating a 4.8%  annual risk of stroke.   S/p dofetilide  admission 05/2022 Patient maintaining SR with low burden recently.  Continue dofetilide  125 mcg BID Continue Eliquis  5 mg BID Continue Toprol  12.5 mg daily  Secondary Hypercoagulable State (ICD10:  D68.69) The patient is at significant risk for stroke/thromboembolism based upon his CHA2DS2-VASc Score of 4.  Continue Apixaban  (Eliquis ). No bleeding issues.   High Risk Medication Monitoring (ICD 10: [id number]) Patient requires ongoing monitoring for anti-arrhythmic medication which has the potential to cause life threatening arrhythmias. QT interval on ECG acceptable for dofetilide  monitoring. Check bmet/mag today.     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 AF clinic in one year.    Daril Kicks PA-C Afib Clinic [facility] [address] [place], [place] [address] [phone] 12/01/2023 10:31 AM

## 2023-12-02 ENCOUNTER — Ambulatory Visit (HOSPITAL_COMMUNITY): Payer: Self-pay | Admitting: Physician Assistant

## 2023-12-02 LAB — BASIC METABOLIC PANEL WITH GFR
BUN/Creatinine Ratio: 21 (ref 10–24)
BUN: 26 mg/dL (ref 8–27)
CO2: 22 mmol/L (ref 20–29)
Calcium: 8.9 mg/dL (ref 8.6–10.2)
Chloride: 104 mmol/L (ref 96–106)
Creatinine, Ser: 1.21 mg/dL (ref 0.76–1.27)
Glucose: 120 mg/dL — ABNORMAL HIGH (ref 70–99)
Potassium: 4.3 mmol/L (ref 3.5–5.2)
Sodium: 139 mmol/L (ref 134–144)
eGFR: 60 mL/min/1.73 (ref >59)

## 2023-12-02 LAB — MAGNESIUM: Magnesium: 1.8 mg/dL (ref 1.6–2.3)

## 2023-12-05 ENCOUNTER — Other Ambulatory Visit: Payer: Self-pay | Admitting: Cardiology

## 2023-12-07 ENCOUNTER — Other Ambulatory Visit: Payer: Self-pay | Admitting: Nurse Practitioner

## 2023-12-09 NOTE — Progress Notes (Signed)
 Remote pacemaker transmission.

## 2023-12-10 ENCOUNTER — Telehealth: Payer: Self-pay

## 2023-12-10 NOTE — Telephone Encounter (Signed)
 Pharmacy please advise on holding Eliquis  prior to LUMBAR SPINE- L2-L3 TRANSLAM  scheduled for TBD. Last labs (CBC) 07/19/2023 and (BMET) 12/01/2023. Thank you.

## 2023-12-10 NOTE — Telephone Encounter (Signed)
   Pre-operative Risk Assessment    Patient Name: Calvin Cochran  DOB: 01-07-1941 MRN: [id number]   Date of last office visit: 10/04/23 ROSALINE BANE, NP Date of next office visit: NONE   Request for Surgical Clearance    Procedure:  LUMBAR SPINE- L2-L3 TRANSLAM  Date of Surgery:  Clearance TBD                                Surgeon:  DR VICTORY GENS Surgeon's Group or Practice Name:  [facility] Phone number:  [phone] Fax number:  [phone]  OR  [phone]    ATTN: KATIE   Type of Clearance Requested:   - Medical  - Pharmacy:  Hold Apixaban  (Eliquis )     Type of Anesthesia:  General    Additional requests/questions:    SignedLucie DELENA Ku   12/10/2023, 3:22 PM

## 2023-12-14 ENCOUNTER — Telehealth: Payer: Self-pay

## 2023-12-14 NOTE — Telephone Encounter (Signed)
   Name: Calvin Cochran  DOB: 02-27-1941  MRN: [id number]  Primary Cardiologist: Jayson Sierras, MD   Preoperative team, please contact this patient and set up a phone call appointment for further preoperative risk assessment. Please obtain consent and complete medication review. Thank you for your help.  I confirm that guidance regarding antiplatelet and oral anticoagulation therapy has been completed and, if necessary, noted below.  Patient has not had an Afib/aflutter ablation or Watchman within the last 3 months or DCCV within the last 30 days    Per office protocol, patient can hold Eliquis  for 3 days prior to procedure.   I also confirmed the patient resides in the state of [place] . As per [facility] Medical Board telemedicine laws, the patient must reside in the state in which the provider is licensed.   Josefa CHRISTELLA Beauvais, NP 12/14/2023, 3:37 PM [facility]

## 2023-12-14 NOTE — Telephone Encounter (Signed)
Patient has been scheduled for televisit.

## 2023-12-14 NOTE — Telephone Encounter (Addendum)
 Patient with diagnosis of afib on Eliquis  for anticoagulation.    Procedure: LUMBAR SPINE- L2-L3 TRANSLAM  Date of procedure: TBD   CHA2DS2-VASc Score = 4   This indicates a 4.8% annual risk of stroke. [name] score is based upon: CHF History: 0 HTN History: 1 Diabetes History: 0 Stroke History: 0 Vascular Disease History: 1 Age Score: 2 Gender Score: 0     CrCl 49.8 Platelet count 228  Patient has not had an Afib/aflutter ablation or Watchman within the last 3 months or DCCV within the last 30 days   Per office protocol, patient can hold Eliquis  for 3 days prior to procedure.   **This guidance is not considered finalized until pre-operative APP has relayed final recommendations.**

## 2023-12-14 NOTE — Telephone Encounter (Signed)
 Patient has been scheduled for televisit med rec and consent done     Patient Consent for Virtual Visit         Calvin Cochran has provided verbal consent on 12/14/2023 for a virtual visit (video or telephone).   CONSENT FOR VIRTUAL VISIT FOR:  Calvin Cochran  By participating in this virtual visit I agree to the following:  I hereby voluntarily request, consent and authorize [facility] and its employed or contracted physicians, physician assistants, nurse practitioners or other licensed health care professionals (the Practitioner), to provide me with telemedicine health care services (the "Services) as deemed necessary by the treating Practitioner. I acknowledge and consent to receive the Services by the Practitioner via telemedicine. I understand that the telemedicine visit will involve communicating with the Practitioner through live audiovisual communication technology and the disclosure of certain medical information by electronic transmission. I acknowledge that I have been given the opportunity to request an in-person assessment or other available alternative prior to the telemedicine visit and am voluntarily participating in the telemedicine visit.  I understand that I have the right to withhold or withdraw my consent to the use of telemedicine in the course of my care at any time, without affecting my right to future care or treatment, and that the Practitioner or I may terminate the telemedicine visit at any time. I understand that I have the right to inspect all information obtained and/or recorded in the course of the telemedicine visit and may receive copies of available information for a reasonable fee.  I understand that some of the potential risks of receiving the Services via telemedicine include:  Delay or interruption in medical evaluation due to technological equipment failure or disruption; Information transmitted may not be sufficient (e.g. poor resolution of  images) to allow for appropriate medical decision making by the Practitioner; and/or  In rare instances, security protocols could fail, causing a breach of personal health information.  Furthermore, I acknowledge that it is my responsibility to provide information about my medical history, conditions and care that is complete and accurate to the best of my ability. I acknowledge that Practitioner's advice, recommendations, and/or decision may be based on factors not within their control, such as incomplete or inaccurate data provided by me or distortions of diagnostic images or specimens that may result from electronic transmissions. I understand that the practice of medicine is not an exact science and that Practitioner makes no warranties or guarantees regarding treatment outcomes. I acknowledge that a copy of this consent can be made available to me via my patient portal [facility] MyChart), or I can request a printed copy by calling the office of [facility].    I understand that my insurance will be billed for this visit.   I have read or had this consent read to me. I understand the contents of this consent, which adequately explains the benefits and risks of the Services being provided via telemedicine.  I have been provided ample opportunity to ask questions regarding this consent and the Services and have had my questions answered to my satisfaction. I give my informed consent for the services to be provided through the use of telemedicine in my medical care

## 2023-12-17 ENCOUNTER — Ambulatory Visit: Attending: Cardiology

## 2023-12-17 DIAGNOSIS — Z01810 Encounter for preprocedural cardiovascular examination: Secondary | ICD-10-CM | POA: Diagnosis not present

## 2023-12-17 NOTE — Progress Notes (Signed)
 Virtual Visit via Telephone Note   Because of Calvin Cochran co-morbid illnesses, he is at least at moderate risk for complications without adequate follow up.  This format is felt to be most appropriate for this patient at this time.  Due to technical limitations with video connection (technology), today's appointment will be conducted as an audio only telehealth visit, and Calvin Cochran verbally agreed to proceed in this manner.   All issues noted in this document were discussed and addressed.  No physical exam could be performed with this format.  Evaluation Performed:  Preoperative cardiovascular risk assessment _____________   Date:  12/17/2023   Patient ID:  Calvin Cochran, DOB 15-Aug-1940, MRN [id number] Patient Location:  Home Provider location:   Office  Primary Care Provider:  Junette Bouchard, FNP Primary Cardiologist:  Calvin Sierras, Calvin Cochran  Chief Complaint / Patient Profile   83 y.o. y/o male with a h/o paroxysmal atrial fibrillation on chronic anticoagulation, CAD status post PCI to LAD back in 2018, hyperlipidemia, tachycardia-bradycardia syndrome status post PPM, and CKD stage IIIb who is pending lumbar spine L2-L3 steroid injection and presents today for telephonic preoperative cardiovascular risk assessment.  History of Present Illness    Calvin Cochran is a 83 y.o. male who presents via audio/video conferencing for a telehealth visit today.  Pt was last seen in cardiology clinic on 10/04/2023 by Calvin Bane, Calvin Cochran.  At that time Calvin Cochran was doing well.  The patient is now pending procedure as outlined above. Since his last visit, he has had no issues with his heart. Back pain but no chest pain or SOB. No issues with his heart rhythm lately. He saw Calvin Cochran in the Afib clinic and was put out to a year follow up. 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  He does surpass 4 METS on the DASI.  Patient has not had an Afib/aflutter ablation or Watchman within  the last 3 months or DCCV within the last 30 days    Per office protocol, patient can hold Eliquis  for 3 days prior to procedure. He can resume once surgery says it is medically safe to do so.  Past Medical History    Past Medical History:  Diagnosis Date   CAD (coronary artery disease)    DES to mid LAD October 2018   Dyspnea    with exertion   Essential hypertension    NSTEMI (non-ST elevated myocardial infarction) (HCC) 01/2017   Paroxysmal atrial fibrillation (HCC)    Tachycardia-bradycardia syndrome [facility])    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 Waddell   Past Surgical History:  Procedure Laterality Date   COLONOSCOPY     CORONARY STENT INTERVENTION N/A 02/01/2017   Procedure: CORONARY STENT INTERVENTION;  Surgeon: Court Dorn PARAS, Calvin Cochran;  Location: [facility] INVASIVE CV LAB;  Service: Cardiovascular;  Laterality: N/A;  LAD Prox-Mid lesion stented   LEFT HEART CATH AND CORONARY ANGIOGRAPHY N/A 02/01/2017   Procedure: LEFT HEART CATH AND CORONARY ANGIOGRAPHY;  Surgeon: Court Dorn PARAS, Calvin Cochran;  Location: [facility] INVASIVE CV LAB;  Service: Cardiovascular;  Laterality: N/A;   PACEMAKER IMPLANT N/A 09/09/2020   Procedure: PACEMAKER IMPLANT;  Surgeon: Waddell Danelle ORN, Calvin Cochran;  Location: [facility] INVASIVE CV LAB;  Service: Cardiovascular;  Laterality: N/A;   TOTAL HIP ARTHROPLASTY Left 07/27/2023   Procedure: LEFT TOTAL HIP ARTHROPLASTY;  Surgeon: Addie Cordella Hamilton, Calvin Cochran;  Location[facility];  Service: Orthopedics;  Laterality: Left;    Allergies  Allergies  Allergen Reactions   Amoxicillin Itching    Home Medications    Prior to Admission medications   Medication Sig Start Date End Date Taking? Authorizing Provider  amLODipine  (NORVASC ) 10 MG tablet TAKE 1 TABLET BY MOUTH EVERY DAY 12/07/23   Swinyer, Calvin HERO, Calvin Cochran  apixaban  (ELIQUIS ) 5 MG TABS tablet TAKE 1 TABLET BY MOUTH TWICE A DAY 10/13/23   Debera Calvin MATSU, Calvin Cochran  atorvastatin  (LIPITOR ) 80 MG tablet Take 1 tablet (80 mg total) by mouth daily. 07/19/23    Miriam Norris, Calvin Cochran  docusate sodium  (COLACE) 100 MG capsule Take 1 capsule (100 mg total) by mouth 2 (two) times daily. 07/29/23   Addie Cordella Hamilton, Calvin Cochran  dofetilide  (TIKOSYN ) 125 MCG capsule TAKE 1 CAPSULE BY MOUTH TWICE DAILY KEEP UPCOMING APPT 09/23/23   Fenton, Clint R, PA  furosemide  (LASIX ) 20 MG tablet Take 1 tablet (20 mg total) by mouth every other day. 10/12/23   Swinyer, Calvin HERO, Calvin Cochran  losartan  (COZAAR ) 50 MG tablet TAKE 1 TABLET BY MOUTH EVERY DAY 12/07/23   Swinyer, Calvin HERO, Calvin Cochran  MAGNESIUM  EXTRA STRENGTH 400 MG CAPS TAKE 1 CAPSULE BY MOUTH DAILY. 02/22/23   Fenton, Clint R, PA  metoprolol  succinate (TOPROL -XL) 25 MG 24 hr tablet TAKE 1 TABLET (25 MG TOTAL) BY MOUTH DAILY. 08/23/23   Debera Calvin MATSU, Calvin Cochran  nitroGLYCERIN  (NITROSTAT ) 0.4 MG SL tablet Place 1 tablet (0.4 mg total) under the tongue every 5 (five) minutes x 3 doses as needed for chest pain. 07/13/23   Miriam Norris, Calvin Cochran  potassium chloride  SA (KLOR-CON  M) 20 MEQ tablet Take 2 tablets (40 mEq total) by mouth daily. 06/04/23   Fenton, Clint R, PA  traMADol  (ULTRAM ) 50 MG tablet Take by mouth. 12/08/23   Provider, Historical, Calvin Cochran    Physical Exam    Vital Signs:  Calvin Cochran does not have vital signs available for review today.  Given telephonic nature of communication, physical exam is limited. AAOx3. NAD. Normal affect.  Speech and respirations are unlabored.  Accessory Clinical Findings    None  Assessment & Plan    1.  Preoperative Cardiovascular Risk Assessment:   Calvin Cochran perioperative risk of a major cardiac event is 0.9% according to the Revised Cardiac Risk Index (RCRI).  Therefore, he is at low risk for perioperative complications.   His functional capacity is good at 5.07 METs according to the Duke Activity Status Index (DASI). Recommendations: According to ACC/AHA guidelines, no further cardiovascular testing needed.  The patient may proceed to surgery at acceptable risk.   Antiplatelet and/or  Anticoagulation Recommendations:  Eliquis  (Apixaban ) can be held for 3 days prior to surgery.  Please resume post op when felt to be safe.     The patient was advised that if he develops new symptoms prior to surgery to contact our office to arrange for a follow-up visit, and he verbalized understanding.   A copy of this note will be routed to requesting surgeon.  Time:   Today, I have spent 5 minutes with the patient with telehealth technology discussing medical history, symptoms, and management plan.     Calvin Cochran Fabry, PA-C  12/17/2023, 9:43 AM

## 2023-12-27 ENCOUNTER — Other Ambulatory Visit (HOSPITAL_COMMUNITY): Payer: Self-pay | Admitting: *Deleted

## 2023-12-27 MED ORDER — MAGNESIUM OXIDE -MG SUPPLEMENT 400 MG PO CAPS: 1.0000 | ORAL_CAPSULE | Freq: Every day | ORAL | 1 refills | Status: AC

## 2023-12-31 ENCOUNTER — Ambulatory Visit: Payer: Medicare HMO

## 2023-12-31 DIAGNOSIS — I495 Sick sinus syndrome: Secondary | ICD-10-CM | POA: Diagnosis not present

## 2023-12-31 LAB — CUP PACEART REMOTE DEVICE CHECK
Battery Remaining Longevity: 82 mo
Battery Remaining Percentage: 71 %
Battery Voltage: 3.01 V
Brady Statistic AP VP Percent: 1 %
Brady Statistic AP VS Percent: 31 %
Brady Statistic AS VP Percent: 1.9 %
Brady Statistic AS VS Percent: 66 %
Brady Statistic RA Percent Paced: 27 %
Brady Statistic RV Percent Paced: 7.2 %
Date Time Interrogation Session: 20250926033103
Implantable Lead Connection Status: 753985
Implantable Lead Connection Status: 753985
Implantable Lead Implant Date: 20220606
Implantable Lead Implant Date: 20220606
Implantable Lead Location: 753859
Implantable Lead Location: 753860
Implantable Pulse Generator Implant Date: 20220606
Lead Channel Impedance Value: 410 Ohm
Lead Channel Impedance Value: 450 Ohm
Lead Channel Pacing Threshold Amplitude: 0.5 V
Lead Channel Pacing Threshold Amplitude: 1 V
Lead Channel Pacing Threshold Pulse Width: 0.5 ms
Lead Channel Pacing Threshold Pulse Width: 0.5 ms
Lead Channel Sensing Intrinsic Amplitude: 12 mV
Lead Channel Sensing Intrinsic Amplitude: 4.2 mV
Lead Channel Setting Pacing Amplitude: 2 V
Lead Channel Setting Pacing Amplitude: 2.5 V
Lead Channel Setting Pacing Pulse Width: 0.5 ms
Lead Channel Setting Sensing Sensitivity: 2 mV
Pulse Gen Model: 2272
Pulse Gen Serial Number: 6460902

## 2024-01-02 ENCOUNTER — Ambulatory Visit: Payer: Self-pay | Admitting: Internal Medicine

## 2024-01-05 NOTE — Progress Notes (Signed)
 Remote PPM Transmission

## 2024-02-07 ENCOUNTER — Encounter: Payer: Self-pay | Admitting: Radiology

## 2024-03-03 ENCOUNTER — Other Ambulatory Visit: Payer: Self-pay | Admitting: Cardiology

## 2024-03-30 ENCOUNTER — Other Ambulatory Visit: Payer: Self-pay | Admitting: Cardiology

## 2024-03-31 ENCOUNTER — Ambulatory Visit: Payer: Medicare HMO

## 2024-03-31 DIAGNOSIS — I495 Sick sinus syndrome: Secondary | ICD-10-CM

## 2024-03-31 NOTE — Telephone Encounter (Signed)
 Prescription refill request for Eliquis  received. Indication: A-Fib Last office visit: 10/04/23 Scr: 1.21 12/01/23 Care Everywhere  Age: 83 Weight: 84.1 KG

## 2024-04-01 LAB — CUP PACEART REMOTE DEVICE CHECK
Battery Remaining Longevity: 77 mo
Battery Remaining Percentage: 69 %
Battery Voltage: 3.01 V
Brady Statistic AP VP Percent: 1 %
Brady Statistic AP VS Percent: 34 %
Brady Statistic AS VP Percent: 1.8 %
Brady Statistic AS VS Percent: 63 %
Brady Statistic RA Percent Paced: 30 %
Brady Statistic RV Percent Paced: 6.9 %
Date Time Interrogation Session: 20251226020020
Implantable Lead Connection Status: 753985
Implantable Lead Connection Status: 753985
Implantable Lead Implant Date: 20220606
Implantable Lead Implant Date: 20220606
Implantable Lead Location: 753859
Implantable Lead Location: 753860
Implantable Pulse Generator Implant Date: 20220606
Lead Channel Impedance Value: 400 Ohm
Lead Channel Impedance Value: 410 Ohm
Lead Channel Pacing Threshold Amplitude: 0.5 V
Lead Channel Pacing Threshold Amplitude: 1 V
Lead Channel Pacing Threshold Pulse Width: 0.5 ms
Lead Channel Pacing Threshold Pulse Width: 0.5 ms
Lead Channel Sensing Intrinsic Amplitude: 12 mV
Lead Channel Sensing Intrinsic Amplitude: 2.8 mV
Lead Channel Setting Pacing Amplitude: 2 V
Lead Channel Setting Pacing Amplitude: 2.5 V
Lead Channel Setting Pacing Pulse Width: 0.5 ms
Lead Channel Setting Sensing Sensitivity: 2 mV
Pulse Gen Model: 2272
Pulse Gen Serial Number: 6460902

## 2024-04-02 ENCOUNTER — Ambulatory Visit: Payer: Self-pay | Admitting: Internal Medicine

## 2024-04-03 NOTE — Progress Notes (Signed)
 Remote PPM Transmission

## 2024-04-19 ENCOUNTER — Telehealth: Payer: Self-pay | Admitting: Cardiology

## 2024-04-19 ENCOUNTER — Telehealth (HOSPITAL_BASED_OUTPATIENT_CLINIC_OR_DEPARTMENT_OTHER): Payer: Self-pay

## 2024-04-19 NOTE — Telephone Encounter (Signed)
 Pharmacy, can you please provide recommendations for holding Eliquis  for upcoming spinal surgery?  Patient will need a virtual visit following Pharm recommendations.

## 2024-04-19 NOTE — Telephone Encounter (Signed)
"  ° °  Pre-operative Risk Assessment    Patient Name: Calvin Cochran  DOB: 06/07/1940 MRN: [id number]   Date of last office visit: 10/04/23 with Swinyer Date of next office visit: NA  Request for Surgical Clearance    Procedure:  Sublaminar Decompression L1-2 L2-3 right side   Date of Surgery:  Clearance TBD                                 Surgeon:  Dr. Colon Surgeon's Group or Practice Name:  [facility] Phone number:  [phone] Fax number:  [phone]   Type of Clearance Requested:   - Medical  - Pharmacy:  Hold Apixaban  (Eliquis ) not indicated   Type of Anesthesia:  General    Additional requests/questions:    Bonney Augustin JONETTA Delores   04/19/2024, 11:07 AM   "

## 2024-04-19 NOTE — Telephone Encounter (Signed)
Duplicate. Will remove from pre-op pool.

## 2024-04-19 NOTE — Telephone Encounter (Signed)
" ° °  Pre-operative Risk Assessment    Patient Name: Calvin Cochran  DOB: 1940-04-12 MRN: [id number]      Request for Surgical Clearance    Procedure:  Sublaminar decompression L1-2 L2-3 Right side approach   Date of Surgery:  Clearance TBD                                 Surgeon:  Victory Colon COME Surgeon's Group or Practice Name:  [facility] Phone number:  [phone] Fax number:  [phone]   Type of Clearance Requested:   - Medical  - Pharmacy:  Hold Apixaban  (Eliquis ) please advise    Type of Anesthesia:  General    Additional requests/questions:  Please fax a copy of clearance  to the surgeon's office.  Signed, Darryle GORMAN Glance   04/19/2024, 10:57 AM   "

## 2024-04-24 NOTE — Telephone Encounter (Signed)
 Patient with diagnosis of afib on Eliquis  for anticoagulation.    Procedure: Sublaminar Decompression L1-2 L2-3 right side  Date of procedure: TBD   CHA2DS2-VASc Score = 4   This indicates a 4.8% annual risk of stroke. [name] score is based upon: CHF History: 0 HTN History: 1 Diabetes History: 0 Stroke History: 0 Vascular Disease History: 1 Age Score: 2 Gender Score: 0      CrCl 49 ml/min Platelet count 228  Patient has not had an Afib/aflutter ablation in the last 3 months, DCCV within the last 4 weeks or a watchman implanted in the last 45 days   Per office protocol, patient can hold Eliquis  for 3 days prior to procedure.    **This guidance is not considered finalized until pre-operative APP has relayed final recommendations.**

## 2024-04-24 NOTE — Telephone Encounter (Signed)
 Patient returned Pre-op call.

## 2024-04-24 NOTE — Telephone Encounter (Signed)
 Pt has been scheduled tele preop appt 04/28/24. Med rec and consent are done. Pt states procedure will not be scheduled until he has been cleared.      Patient Consent for Virtual Visit        Calvin Cochran has provided verbal consent on 04/24/2024 for a virtual visit (video or telephone).   CONSENT FOR VIRTUAL VISIT FOR:  Calvin Cochran  By participating in this virtual visit I agree to the following:  I hereby voluntarily request, consent and authorize [facility] and its employed or contracted physicians, physician assistants, nurse practitioners or other licensed health care professionals (the Practitioner), to provide me with telemedicine health care services (the Services) as deemed necessary by the treating Practitioner. I acknowledge and consent to receive the Services by the Practitioner via telemedicine. I understand that the telemedicine visit will involve communicating with the Practitioner through live audiovisual communication technology and the disclosure of certain medical information by electronic transmission. I acknowledge that I have been given the opportunity to request an in-person assessment or other available alternative prior to the telemedicine visit and am voluntarily participating in the telemedicine visit.  I understand that I have the right to withhold or withdraw my consent to the use of telemedicine in the course of my care at any time, without affecting my right to future care or treatment, and that the Practitioner or I may terminate the telemedicine visit at any time. I understand that I have the right to inspect all information obtained and/or recorded in the course of the telemedicine visit and may receive copies of available information for a reasonable fee.  I understand that some of the potential risks of receiving the Services via telemedicine include:  Delay or interruption in medical evaluation due to technological equipment failure or  disruption; Information transmitted may not be sufficient (e.g. poor resolution of images) to allow for appropriate medical decision making by the Practitioner; and/or  In rare instances, security protocols could fail, causing a breach of personal health information.  Furthermore, I acknowledge that it is my responsibility to provide information about my medical history, conditions and care that is complete and accurate to the best of my ability. I acknowledge that Practitioner's advice, recommendations, and/or decision may be based on factors not within their control, such as incomplete or inaccurate data provided by me or distortions of diagnostic images or specimens that may result from electronic transmissions. I understand that the practice of medicine is not an exact science and that Practitioner makes no warranties or guarantees regarding treatment outcomes. I acknowledge that a copy of this consent can be made available to me via my patient portal [facility] MyChart), or I can request a printed copy by calling the office of [facility].    I understand that my insurance will be billed for this visit.   I have read or had this consent read to me. I understand the contents of this consent, which adequately explains the benefits and risks of the Services being provided via telemedicine.  I have been provided ample opportunity to ask questions regarding this consent and the Services and have had my questions answered to my satisfaction. I give my informed consent for the services to be provided through the use of telemedicine in my medical care

## 2024-04-24 NOTE — Telephone Encounter (Signed)
Left message to call back and schedule tele pre op appt

## 2024-04-24 NOTE — Telephone Encounter (Signed)
 Pt has been scheduled tele preop appt 04/28/24. Med rec and consent are done. Pt states procedure will not be scheduled until he has been cleared.

## 2024-04-24 NOTE — Telephone Encounter (Signed)
" ° °  Name: Calvin Cochran  DOB: [birth year]  MRN: [id number]  Primary Cardiologist: Jayson Sierras, MD   Preoperative team, please contact this patient and set up a phone call appointment for further preoperative risk assessment. Please obtain consent and complete medication review. Thank you for your help.  I confirm that guidance regarding antiplatelet and oral anticoagulation therapy has been completed and, if necessary, noted below.  Per office protocol, patient can hold Eliquis  for 3 days prior to procedure.    I also confirmed the patient resides in the state of  . As per [facility] Medical Board telemedicine laws, the patient must reside in the state in which the provider is licensed.   Lamarr Satterfield, NP 04/24/2024, 1:34 PM [facility]    "

## 2024-04-28 ENCOUNTER — Ambulatory Visit: Attending: Cardiovascular Disease | Admitting: Student

## 2024-04-28 DIAGNOSIS — Z01810 Encounter for preprocedural cardiovascular examination: Secondary | ICD-10-CM

## 2024-04-28 NOTE — Progress Notes (Signed)
 "   Virtual Visit via Telephone Note   Because of Calvin Cochran's co-morbid illnesses, he is at least at moderate risk for complications without adequate follow up.  This format is felt to be most appropriate for this patient at this time.  The patient did not have access to video technology/had technical difficulties with video requiring transitioning to audio format only (telephone).  All issues noted in this document were discussed and addressed.  No physical exam could be performed with this format.  Please refer to the patient's chart for his consent to telehealth for [facility].  Evaluation Performed:  Preoperative cardiovascular risk assessment _____________   Date:  04/28/2024   Patient ID:  Calvin Cochran, DOB [birth year], MRN [id number] Patient Location:  Home Provider location:   Office  Primary Care Provider:  Junette Bouchard, FNP  Primary Cochran:  Calvin Cochran:  Calvin Sierras, MD Electrophysiologist:  Calvin Birmingham, MD   Chief Complaint / Patient Profile   84 y.o. y/o male with a h/o CAD s/p PCI with DES to proximal to mid LAD, low risk stress test August 2024, PAF on anticoagulation, tachybrady syndrome s/p PPM, hypertension, hyperlipidemia, CKD stage IIIb who is pending Sublaminar Decompression L1-2 L2-3 right side by Dr. Colon and presents today for telephonic preoperative cardiovascular risk assessment.  History of Present Illness    Calvin Cochran is a 84 y.o. male who presents via audio/video conferencing for a telehealth visit today.  Pt was last seen in cardiology Cochran on 10/04/2023 by Calvin Bane, NP and 12/01/2023 by Calvin Cochran.  At that time Calvin Cochran was stable from a cardiac standpoint.  The patient is now pending procedure as outlined above. Since his last visit, he is doing well. Patient denies shortness of breath, dyspnea on exertion, lower extremity edema, orthopnea or PND. No chest pain, pressure,  or tightness. No palpitations.  No lightheadedness, dizziness, presyncope or syncope. Activity is limited by back pain. He has been performing chair exercises daily. He is able to perform light to moderate household activities.   Past Medical History    Past Medical History:  Diagnosis Date   CAD (coronary artery disease)    DES to mid LAD October 2018   Dyspnea    with exertion   Essential hypertension    NSTEMI (non-ST elevated myocardial infarction) (HCC) 01/2017   Paroxysmal atrial fibrillation (HCC)    Tachycardia-bradycardia syndrome [facility])    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 Birmingham   Past Surgical History:  Procedure Laterality Date   COLONOSCOPY     CORONARY STENT INTERVENTION N/A 02/01/2017   Procedure: CORONARY STENT INTERVENTION;  Surgeon: Court Dorn PARAS, MD;  Location: [facility];  Service: Cardiovascular;  Laterality: N/A;  LAD Prox-Mid lesion stented   LEFT HEART CATH AND CORONARY ANGIOGRAPHY N/A 02/01/2017   Procedure: LEFT HEART CATH AND CORONARY ANGIOGRAPHY;  Surgeon: Court Dorn PARAS, MD;  Location: [facility];  Service: Cardiovascular;  Laterality: N/A;   PACEMAKER IMPLANT N/A 09/09/2020   Procedure: PACEMAKER IMPLANT;  Surgeon: Cochran Calvin ORN, MD;  Location: [facility];  Service: Cardiovascular;  Laterality: N/A;   TOTAL HIP ARTHROPLASTY Left 07/27/2023   Procedure: LEFT TOTAL HIP ARTHROPLASTY;  Surgeon: Addie Cordella Hamilton, MD;  Location: [facility] OR;  Service: Orthopedics;  Laterality: Left;    Allergies  Allergies[1]  Home Medications    Prior to Admission medications  Medication Sig Start Date End  Date Taking? Authorizing Provider  amLODipine  (NORVASC ) 10 MG tablet TAKE 1 TABLET BY MOUTH EVERY DAY 12/07/23   Swinyer, Calvin HERO, NP  apixaban  (ELIQUIS ) 5 MG TABS tablet TAKE 1 TABLET BY MOUTH TWICE A DAY 03/31/24   Debera Calvin MATSU, MD  atorvastatin  (LIPITOR ) 80 MG tablet Take 1 tablet (80 mg total) by mouth daily. 07/19/23   Miriam Norris, NP  docusate sodium  (COLACE) 100 MG capsule Take 1 capsule (100 mg total) by mouth 2 (two) times daily. 07/29/23   Addie Cordella Hamilton, MD  dofetilide  (TIKOSYN ) 125 MCG capsule TAKE 1 CAPSULE BY MOUTH TWICE DAILY KEEP UPCOMING APPT 09/23/23   Fenton, Clint R, PA  furosemide  (LASIX ) 20 MG tablet Take 1 tablet (20 mg total) by mouth every other day. 10/12/23   Swinyer, Calvin HERO, NP  losartan  (COZAAR ) 50 MG tablet TAKE 1 TABLET BY MOUTH EVERY DAY 12/07/23   Swinyer, Calvin HERO, NP  Magnesium  Oxide -Mg Supplement (MAGNESIUM  EXTRA STRENGTH) 400 MG CAPS Take 1 capsule by mouth daily. 12/27/23   Fenton, Clint R, PA  metoprolol  succinate (TOPROL -XL) 25 MG 24 hr tablet TAKE 1 TABLET (25 MG TOTAL) BY MOUTH DAILY. 03/08/24   Debera Calvin MATSU, MD  nitroGLYCERIN  (NITROSTAT ) 0.4 MG SL tablet Place 1 tablet (0.4 mg total) under the tongue every 5 (five) minutes x 3 doses as needed for chest pain. 07/13/23   Miriam Norris, NP  potassium chloride  SA (KLOR-CON  M) 20 MEQ tablet Take 2 tablets (40 mEq total) by mouth daily. 06/04/23   Fenton, Clint R, PA  traMADol  (ULTRAM ) 50 MG tablet Take by mouth. 12/08/23   [provider]    Physical Exam    Vital Signs:  [name] does not have vital signs available for review today.  Given telephonic nature of communication, physical exam is limited. AAOx3. NAD. Normal affect.  Speech and respirations are unlabored.   Assessment & Plan    Preoperative cardiovascular risk assessment. Sublaminar Decompression L1-2 L2-3 right side  by Dr. Colon  Chart reviewed as part of pre-operative protocol coverage. According to the RCRI, patient has a 0.9% risk of MACE. Patient reports activity equivalent to >4.0 METS (daily chair exercises, light to moderate household chores).   Given past medical history and time since last visit, based on ACC/AHA guidelines, [name] would be at acceptable risk for the planned procedure without further cardiovascular  testing.   Patient was advised that if he develops new symptoms prior to surgery to contact our office to arrange a follow-up appointment.  he verbalized understanding.  Per Pharm D, patient has not had an Calvin/aflutter ablation within the last 90 days, DCCV within the last 4 weeks, or Watchman in the last 45 days. Patient may hold Eliquis  for 3 days prior to procedure.  Patient will not need bridging with Lovenox  around procedure.    I will route this recommendation to the requesting party via Epic fax function.  Please call with questions.  Time:   Today, I have spent 5 minutes with the patient with telehealth technology discussing medical history, symptoms, and management plan.     Barnie Hila, NP  04/28/2024, 7:51 AM     [1]  Allergies Allergen Reactions   Amoxicillin Itching   "
